# Patient Record
Sex: Female | Born: 1950
Health system: Southern US, Community
[De-identification: ages and names within clinical notes are randomized; demographics above are authoritative.]

## PROBLEM LIST (undated history)

## (undated) DIAGNOSIS — K649 Unspecified hemorrhoids: Secondary | ICD-10-CM

## (undated) DIAGNOSIS — J302 Other seasonal allergic rhinitis: Secondary | ICD-10-CM

## (undated) DIAGNOSIS — K219 Gastro-esophageal reflux disease without esophagitis: Secondary | ICD-10-CM

## (undated) DIAGNOSIS — T4145XA Adverse effect of unspecified anesthetic, initial encounter: Secondary | ICD-10-CM

## (undated) DIAGNOSIS — K5909 Other constipation: Secondary | ICD-10-CM

## (undated) DIAGNOSIS — M549 Dorsalgia, unspecified: Secondary | ICD-10-CM

## (undated) DIAGNOSIS — E7800 Pure hypercholesterolemia, unspecified: Secondary | ICD-10-CM

## (undated) DIAGNOSIS — I10 Essential (primary) hypertension: Secondary | ICD-10-CM

## (undated) DIAGNOSIS — M1990 Unspecified osteoarthritis, unspecified site: Secondary | ICD-10-CM

## (undated) DIAGNOSIS — T8859XA Other complications of anesthesia, initial encounter: Secondary | ICD-10-CM

## (undated) HISTORY — PX: OTHER SURGICAL HISTORY: SHX169

## (undated) HISTORY — DX: Gastro-esophageal reflux disease without esophagitis: K21.9

## (undated) HISTORY — PX: ROTATOR CUFF REPAIR: SHX139

## (undated) HISTORY — DX: Dorsalgia, unspecified: M54.9

## (undated) HISTORY — PX: HAND SURGERY: SHX662

## (undated) HISTORY — PX: DILATION AND CURETTAGE OF UTERUS: SHX78

## (undated) HISTORY — DX: Unspecified hemorrhoids: K64.9

## (undated) HISTORY — PX: CHOLECYSTECTOMY: SHX55

## (undated) HISTORY — PX: BACK SURGERY: SHX140

## (undated) HISTORY — PX: ABDOMINAL HYSTERECTOMY: SHX81

## (undated) HISTORY — DX: Essential (primary) hypertension: I10

## (undated) HISTORY — DX: Pure hypercholesterolemia, unspecified: E78.00

## (undated) HISTORY — DX: Other constipation: K59.09

---

## 1998-06-03 ENCOUNTER — Ambulatory Visit (HOSPITAL_COMMUNITY): Admission: RE | Admit: 1998-06-03 | Discharge: 1998-06-03 | Payer: Self-pay | Admitting: Neurosurgery

## 1998-06-03 ENCOUNTER — Encounter: Payer: Self-pay | Admitting: Neurosurgery

## 1998-06-24 ENCOUNTER — Encounter: Payer: Self-pay | Admitting: Neurosurgery

## 1998-06-24 ENCOUNTER — Ambulatory Visit (HOSPITAL_COMMUNITY): Admission: RE | Admit: 1998-06-24 | Discharge: 1998-06-24 | Payer: Self-pay | Admitting: Neurosurgery

## 1999-12-17 ENCOUNTER — Encounter (INDEPENDENT_AMBULATORY_CARE_PROVIDER_SITE_OTHER): Payer: Self-pay | Admitting: *Deleted

## 1999-12-17 ENCOUNTER — Encounter: Payer: Self-pay | Admitting: Otolaryngology

## 1999-12-17 ENCOUNTER — Ambulatory Visit (HOSPITAL_COMMUNITY): Admission: RE | Admit: 1999-12-17 | Discharge: 1999-12-17 | Payer: Self-pay | Admitting: Otolaryngology

## 2000-07-01 ENCOUNTER — Encounter: Payer: Self-pay | Admitting: Neurosurgery

## 2000-07-01 ENCOUNTER — Ambulatory Visit (HOSPITAL_COMMUNITY): Admission: RE | Admit: 2000-07-01 | Discharge: 2000-07-01 | Payer: Self-pay | Admitting: Neurosurgery

## 2000-10-16 ENCOUNTER — Inpatient Hospital Stay (HOSPITAL_COMMUNITY): Admission: AD | Admit: 2000-10-16 | Discharge: 2000-10-18 | Payer: Self-pay | Admitting: Internal Medicine

## 2000-10-16 ENCOUNTER — Encounter: Payer: Self-pay | Admitting: Internal Medicine

## 2000-10-17 ENCOUNTER — Encounter: Payer: Self-pay | Admitting: Internal Medicine

## 2000-10-17 ENCOUNTER — Encounter: Payer: Self-pay | Admitting: Cardiology

## 2001-03-12 ENCOUNTER — Encounter: Payer: Self-pay | Admitting: Specialist

## 2001-03-12 ENCOUNTER — Ambulatory Visit (HOSPITAL_COMMUNITY): Admission: RE | Admit: 2001-03-12 | Discharge: 2001-03-12 | Payer: Self-pay | Admitting: Specialist

## 2001-03-16 ENCOUNTER — Encounter: Payer: Self-pay | Admitting: Podiatry

## 2001-03-19 ENCOUNTER — Ambulatory Visit (HOSPITAL_COMMUNITY): Admission: RE | Admit: 2001-03-19 | Discharge: 2001-03-19 | Payer: Self-pay | Admitting: Podiatry

## 2001-03-19 ENCOUNTER — Encounter: Payer: Self-pay | Admitting: Podiatry

## 2001-06-06 ENCOUNTER — Ambulatory Visit (HOSPITAL_COMMUNITY): Admission: RE | Admit: 2001-06-06 | Discharge: 2001-06-06 | Payer: Self-pay | Admitting: Specialist

## 2001-06-06 ENCOUNTER — Encounter: Payer: Self-pay | Admitting: Specialist

## 2001-06-15 ENCOUNTER — Ambulatory Visit (HOSPITAL_COMMUNITY): Admission: RE | Admit: 2001-06-15 | Discharge: 2001-06-15 | Payer: Self-pay | Admitting: Specialist

## 2001-06-15 ENCOUNTER — Encounter: Payer: Self-pay | Admitting: Specialist

## 2001-09-07 ENCOUNTER — Encounter: Payer: Self-pay | Admitting: Emergency Medicine

## 2001-09-07 ENCOUNTER — Inpatient Hospital Stay (HOSPITAL_COMMUNITY): Admission: EM | Admit: 2001-09-07 | Discharge: 2001-09-09 | Payer: Self-pay | Admitting: Emergency Medicine

## 2001-09-09 ENCOUNTER — Encounter: Payer: Self-pay | Admitting: Internal Medicine

## 2001-09-21 ENCOUNTER — Ambulatory Visit (HOSPITAL_COMMUNITY): Admission: RE | Admit: 2001-09-21 | Discharge: 2001-09-21 | Payer: Self-pay | Admitting: Internal Medicine

## 2001-09-24 ENCOUNTER — Ambulatory Visit (HOSPITAL_COMMUNITY): Admission: RE | Admit: 2001-09-24 | Discharge: 2001-09-24 | Payer: Self-pay | Admitting: Internal Medicine

## 2001-09-24 ENCOUNTER — Encounter: Payer: Self-pay | Admitting: Internal Medicine

## 2002-03-18 ENCOUNTER — Ambulatory Visit (HOSPITAL_COMMUNITY): Admission: RE | Admit: 2002-03-18 | Discharge: 2002-03-18 | Payer: Self-pay | Admitting: Podiatry

## 2002-06-17 ENCOUNTER — Encounter: Payer: Self-pay | Admitting: Specialist

## 2002-06-17 ENCOUNTER — Ambulatory Visit (HOSPITAL_COMMUNITY): Admission: RE | Admit: 2002-06-17 | Discharge: 2002-06-17 | Payer: Self-pay | Admitting: Specialist

## 2002-09-28 ENCOUNTER — Encounter: Payer: Self-pay | Admitting: Neurosurgery

## 2002-09-28 ENCOUNTER — Ambulatory Visit (HOSPITAL_COMMUNITY): Admission: RE | Admit: 2002-09-28 | Discharge: 2002-09-28 | Payer: Self-pay | Admitting: Neurosurgery

## 2003-06-18 ENCOUNTER — Ambulatory Visit (HOSPITAL_COMMUNITY): Admission: RE | Admit: 2003-06-18 | Discharge: 2003-06-18 | Payer: Self-pay | Admitting: *Deleted

## 2003-10-28 ENCOUNTER — Ambulatory Visit (HOSPITAL_COMMUNITY): Admission: RE | Admit: 2003-10-28 | Discharge: 2003-10-28 | Payer: Self-pay | Admitting: Internal Medicine

## 2003-12-08 ENCOUNTER — Encounter: Payer: Self-pay | Admitting: Orthopedic Surgery

## 2004-01-23 ENCOUNTER — Ambulatory Visit (HOSPITAL_COMMUNITY): Admission: RE | Admit: 2004-01-23 | Discharge: 2004-01-23 | Payer: Self-pay | Admitting: Orthopedic Surgery

## 2004-05-11 ENCOUNTER — Ambulatory Visit (HOSPITAL_COMMUNITY): Admission: RE | Admit: 2004-05-11 | Discharge: 2004-05-11 | Payer: Self-pay | Admitting: Internal Medicine

## 2004-05-27 ENCOUNTER — Ambulatory Visit: Payer: Self-pay | Admitting: Orthopedic Surgery

## 2004-06-01 ENCOUNTER — Encounter (HOSPITAL_COMMUNITY): Admission: RE | Admit: 2004-06-01 | Discharge: 2004-07-01 | Payer: Self-pay | Admitting: Orthopedic Surgery

## 2004-07-01 ENCOUNTER — Ambulatory Visit (HOSPITAL_COMMUNITY): Admission: RE | Admit: 2004-07-01 | Discharge: 2004-07-01 | Payer: Self-pay | Admitting: *Deleted

## 2004-07-05 ENCOUNTER — Encounter (HOSPITAL_COMMUNITY): Admission: RE | Admit: 2004-07-05 | Discharge: 2004-08-04 | Payer: Self-pay | Admitting: Orthopedic Surgery

## 2004-08-11 ENCOUNTER — Ambulatory Visit (HOSPITAL_COMMUNITY): Admission: RE | Admit: 2004-08-11 | Discharge: 2004-08-11 | Payer: Self-pay | Admitting: Internal Medicine

## 2005-07-28 ENCOUNTER — Ambulatory Visit (HOSPITAL_COMMUNITY): Admission: RE | Admit: 2005-07-28 | Discharge: 2005-07-28 | Payer: Self-pay | Admitting: *Deleted

## 2006-05-04 ENCOUNTER — Ambulatory Visit: Payer: Self-pay | Admitting: Internal Medicine

## 2006-05-16 ENCOUNTER — Ambulatory Visit: Payer: Self-pay | Admitting: Orthopedic Surgery

## 2006-05-18 ENCOUNTER — Encounter: Payer: Self-pay | Admitting: Orthopedic Surgery

## 2006-05-18 ENCOUNTER — Ambulatory Visit (HOSPITAL_COMMUNITY): Admission: RE | Admit: 2006-05-18 | Discharge: 2006-05-18 | Payer: Self-pay | Admitting: Orthopedic Surgery

## 2006-05-23 ENCOUNTER — Encounter (HOSPITAL_COMMUNITY): Admission: RE | Admit: 2006-05-23 | Discharge: 2006-06-19 | Payer: Self-pay | Admitting: Orthopedic Surgery

## 2006-05-25 ENCOUNTER — Ambulatory Visit: Payer: Self-pay | Admitting: Orthopedic Surgery

## 2006-06-14 ENCOUNTER — Ambulatory Visit (HOSPITAL_COMMUNITY): Admission: RE | Admit: 2006-06-14 | Discharge: 2006-06-14 | Payer: Self-pay | Admitting: Internal Medicine

## 2006-06-14 ENCOUNTER — Ambulatory Visit: Payer: Self-pay | Admitting: Internal Medicine

## 2006-06-14 HISTORY — PX: ESOPHAGOGASTRODUODENOSCOPY: SHX1529

## 2006-06-27 ENCOUNTER — Ambulatory Visit: Payer: Self-pay | Admitting: Orthopedic Surgery

## 2006-07-20 ENCOUNTER — Ambulatory Visit (HOSPITAL_COMMUNITY): Admission: RE | Admit: 2006-07-20 | Discharge: 2006-07-20 | Payer: Self-pay | Admitting: Internal Medicine

## 2006-08-03 ENCOUNTER — Ambulatory Visit: Payer: Self-pay | Admitting: Internal Medicine

## 2006-10-16 ENCOUNTER — Encounter (INDEPENDENT_AMBULATORY_CARE_PROVIDER_SITE_OTHER): Payer: Self-pay | Admitting: Specialist

## 2006-10-16 ENCOUNTER — Ambulatory Visit (HOSPITAL_COMMUNITY): Admission: RE | Admit: 2006-10-16 | Discharge: 2006-10-16 | Payer: Self-pay | Admitting: Internal Medicine

## 2006-10-16 ENCOUNTER — Ambulatory Visit: Payer: Self-pay | Admitting: Internal Medicine

## 2006-10-16 HISTORY — PX: COLONOSCOPY: SHX5424

## 2007-01-23 ENCOUNTER — Encounter: Payer: Self-pay | Admitting: Orthopedic Surgery

## 2008-04-30 ENCOUNTER — Ambulatory Visit (HOSPITAL_COMMUNITY): Admission: RE | Admit: 2008-04-30 | Discharge: 2008-04-30 | Payer: Self-pay | Admitting: Internal Medicine

## 2008-06-02 ENCOUNTER — Ambulatory Visit: Payer: Self-pay | Admitting: Orthopedic Surgery

## 2008-06-02 DIAGNOSIS — M545 Low back pain: Secondary | ICD-10-CM | POA: Insufficient documentation

## 2008-06-06 ENCOUNTER — Telehealth (INDEPENDENT_AMBULATORY_CARE_PROVIDER_SITE_OTHER): Payer: Self-pay | Admitting: *Deleted

## 2009-05-05 ENCOUNTER — Ambulatory Visit (HOSPITAL_COMMUNITY): Admission: RE | Admit: 2009-05-05 | Discharge: 2009-05-05 | Payer: Self-pay | Admitting: Internal Medicine

## 2009-05-06 ENCOUNTER — Ambulatory Visit (HOSPITAL_COMMUNITY): Admission: RE | Admit: 2009-05-06 | Discharge: 2009-05-06 | Payer: Self-pay | Admitting: Internal Medicine

## 2009-07-21 HISTORY — PX: ESOPHAGOGASTRODUODENOSCOPY: SHX1529

## 2009-07-21 HISTORY — PX: COLONOSCOPY: SHX174

## 2009-07-28 ENCOUNTER — Ambulatory Visit: Payer: Self-pay | Admitting: Internal Medicine

## 2009-07-28 DIAGNOSIS — R198 Other specified symptoms and signs involving the digestive system and abdomen: Secondary | ICD-10-CM

## 2009-07-28 DIAGNOSIS — R634 Abnormal weight loss: Secondary | ICD-10-CM

## 2009-07-28 DIAGNOSIS — K219 Gastro-esophageal reflux disease without esophagitis: Secondary | ICD-10-CM

## 2009-07-28 DIAGNOSIS — K921 Melena: Secondary | ICD-10-CM

## 2009-07-28 DIAGNOSIS — R1013 Epigastric pain: Secondary | ICD-10-CM

## 2009-07-28 DIAGNOSIS — R1310 Dysphagia, unspecified: Secondary | ICD-10-CM | POA: Insufficient documentation

## 2009-07-30 ENCOUNTER — Encounter: Payer: Self-pay | Admitting: Internal Medicine

## 2009-08-03 ENCOUNTER — Ambulatory Visit (HOSPITAL_COMMUNITY): Admission: RE | Admit: 2009-08-03 | Discharge: 2009-08-03 | Payer: Self-pay | Admitting: Internal Medicine

## 2009-08-03 ENCOUNTER — Ambulatory Visit: Payer: Self-pay | Admitting: Internal Medicine

## 2009-08-10 ENCOUNTER — Ambulatory Visit: Payer: Self-pay | Admitting: Orthopedic Surgery

## 2009-08-10 DIAGNOSIS — M25519 Pain in unspecified shoulder: Secondary | ICD-10-CM | POA: Insufficient documentation

## 2009-08-10 DIAGNOSIS — M7580 Other shoulder lesions, unspecified shoulder: Secondary | ICD-10-CM

## 2009-08-10 DIAGNOSIS — M75120 Complete rotator cuff tear or rupture of unspecified shoulder, not specified as traumatic: Secondary | ICD-10-CM

## 2009-08-13 ENCOUNTER — Telehealth: Payer: Self-pay | Admitting: Internal Medicine

## 2009-08-14 ENCOUNTER — Telehealth (INDEPENDENT_AMBULATORY_CARE_PROVIDER_SITE_OTHER): Payer: Self-pay

## 2009-09-22 ENCOUNTER — Ambulatory Visit: Payer: Self-pay | Admitting: Internal Medicine

## 2010-06-17 ENCOUNTER — Ambulatory Visit (HOSPITAL_COMMUNITY)
Admission: RE | Admit: 2010-06-17 | Discharge: 2010-06-17 | Payer: Self-pay | Source: Home / Self Care | Attending: Internal Medicine | Admitting: Internal Medicine

## 2010-07-10 ENCOUNTER — Encounter: Payer: Self-pay | Admitting: *Deleted

## 2010-07-11 ENCOUNTER — Encounter: Payer: Self-pay | Admitting: *Deleted

## 2010-07-11 ENCOUNTER — Encounter: Payer: Self-pay | Admitting: Internal Medicine

## 2010-07-22 NOTE — Letter (Signed)
Summary: TCS/EGD ORDER  TCS/EGD ORDER   Imported By: Ave Filter 07/28/2009 10:24:36  _____________________________________________________________________  External Attachment:    Type:   Image     Comment:   External Document

## 2010-07-22 NOTE — Assessment & Plan Note (Signed)
Summary: PROCEDURE F/U PER RMR/CM   Visit Type:  f/u Primary Care Provider:  [facility]  Chief Complaint:  follow up from procedure and still no appitite.  History of Present Illness: Patient is here for f/u of EGD/TCS/TI. She had erosive esophagitis, distal ring s/p dilation. Mid-esophagus was ring like in appearance, biopsies benign. Random colon bx negative for microscopic colitis. Patient has gained five pounds. She has been on Nexium 40mg  by mouth two times a day for two months. Epigastric pain is better. She has some regurgitation at night especially if she eats late. BM are better. No blood in stool. Swallowing is better s/p dilation. She has some vague lower abdominal discomfort and plans to see her gyn. Denies dysuria.   She was started on Naproxyn two months ago for rotator cuff issues. She previously was on relafen but did not tolerate. She tries to limit use.    Current Medications (verified): 1)  Diovan Hct 160-12.5 Mg Tabs (Valsartan-Hydrochlorothiazide) .... Once Daily 2)  Ibuprofen 200 Mg Tabs (Ibuprofen) .... Two Times A Day 3)  Naprosyn 500 Mg Tabs (Naproxen) .Marland Kitchen.. 1 By Mouth Two Times A Day 4)  Zocor 40 Mg Tabs (Simvastatin) .... Once Daily 5)  Reglan 10 Mg Tabs (Metoclopramide Hcl) .... Once Daily As Needed 6)  Elavil .... As Needed 7)  Tylenol 325 Mg Tabs (Acetaminophen) .... As Needed 8)  Nexium 40 Mg Cpdr (Esomeprazole Magnesium) .... One By Mouth Two Times A Day (30 Mins Before Meal)  Allergies (verified): 1)  ! Demerol 2)  ! Codeine  Review of Systems      See HPI  Vital Signs:  Patient profile:   60 year old female Height:      64.5 inches Weight:      157 pounds BMI:     26.63 Temp:     97.8 degrees F oral Pulse rate:   88 / minute BP sitting:   132 / 88  (left arm) Cuff size:   regular  Vitals Entered By: Hendricks Limes LPN (September 22, 1608 9:16 AM)  Physical Exam  General:  Well developed, well nourished, no acute distress. Head:  Normocephalic and  atraumatic. Eyes:  sclera nonicteric Mouth:  op moist Abdomen:  Bowel sounds normal.  Abdomen is soft, nontender, nondistended.  No rebound or guarding.  No hepatosplenomegaly, masses or hernias.  No abdominal bruits.  Extremities:  No clubbing, cyanosis, edema or deformities noted. Neurologic:  Alert and  oriented x4;  grossly normal neurologically. Skin:  Intact without significant lesions or rashes. Psych:  Alert and cooperative. Normal mood and affect.  Impression & Recommendations:  Problem # 1:  GERD (ICD-530.81)  Doing better on Nexium two times a day. Will continue at least six more weeks and then she can try once per day. Patient assistance forms to be completed by patient. RX for Nexium 40mg  by mouth two times a day #180, 3 refills provided. Samples of Nexium #40 given as well. Antireflux measures stressed especially related to night time symptoms.  OV in four months with Dr. Jena Gauss.  Orders: Est. Patient Level II (96045)  Problem # 2:  EPIGASTRIC PAIN (ICD-789.06)  Gastritis on biopsy. Improved symptoms. She will need to monitor symptoms closely while on naproxyn. Ideally she should avoid NSAIDS but she doesn't think she can due to rotator cuff injury. She will limit use of naproxyn as much as possible, but if epigastric pain worsens she should stop all together.  Orders: Est. Patient Level  II [phone])  Problem # 3:  NEOPLASM, MALIGNANT, COLON, FAMILY HX, SIBLING (ICD-V16.0)  Next TCS 07/2014  Orders: Est. Patient Level II (29562)

## 2010-07-22 NOTE — Letter (Signed)
Summary: Historic Patient File  Historic Patient File   Imported By: Elvera Maria 08/12/2009 13:51:18  _____________________________________________________________________  External Attachment:    Type:   Image     Comment:   history form

## 2010-07-22 NOTE — Assessment & Plan Note (Signed)
Summary: RT ARM PAIN/NEEDS XRAY/MEDICARE/CAF   Vital Signs:  Patient profile:   60 year old female Weight:      152 pounds Pulse rate:   64 / minute Resp:     16 per minute  Vitals Entered By: Fuller Canada MD (August 10, 2009 10:16 AM)  Visit Type:  new problem Referring Provider:  self Primary Provider:  Felecia Shelling  CC:  right arm pain.  History of Present Illness: I saw Tammy Kramer in the office today for a followup visit.  She is a 60 years old woman with the complaint of:  right shoulder pain.  Has had surgery on this shoulder before.  Xrays in our office today.  Meds: Diovan, Prevacid and Advil.    Allergies: 1)  ! Demerol 2)  ! Codeine  Past History:  Past Medical History: DJD Colonoscopy April 2008 by Dr. Karilyn Cota, 3 mm hyperplastic polyp removed from the splenic flexure. EGD with bravo device for pH study placement, December 2007. EGD was normal. PH study was normal as well on double dose PPI EGD, April 2003, from a small sliding hiatal hernia, esophageal dilation performed, nonerosive gastritis of the stomach. H. Pylori +, s/p treatment Colonoscopy, April 2003, normal except for small hemorrhoids. GERD Hypertension Hyperlipidemia DDD  Family History: Both parents are deceased. Mother died age 61 and father at 61. 2 brothers are deceased, half brother died of prostate cancer a 72. Another brother died of colon cancer age 61. Family History of Diabetes Family History Coronary Heart Disease female < 36 Family History of Arthritis  Social History: Divorced. 3 children. She worked at [employer] for 15 years, now disabled. She watches her 82 years old twin grandchildren daily. She never smoked cigarettes. No alcohol use. 2 cups of coffee per day  Review of Systems General:  Complains of weight loss and chills; denies weight gain, fever, and fatigue. Cardiac :  Complains of chest pain; denies angina, heart attack, heart failure, poor circulation,  blood clots, and phlebitis. Resp:  Denies short of breath, difficulty breathing, COPD, cough, and pneumonia. GI:  Complains of reflux; denies nausea, vomiting, diarrhea, constipation, difficulty swallowing, ulcers, and GERD. GU:  Denies kidney failure, kidney transplant, kidney stones, burning, poor stream, testicular cancer, blood in urine, and . Neuro:  Denies headache, dizziness, migraines, numbness, weakness, tremor, and unsteady walking. MS:  Complains of joint pain and joint swelling; denies rheumatoid arthritis, gout, bone cancer, osteoporosis, and ; stiffness and muscle pain. Endo:  Denies thyroid disease, goiter, and diabetes. Psych:  Denies depression, mood swings, anxiety, panic attack, bipolar, and schizophrenia. Derm:  Denies eczema, cancer, and itching. EENT:  Denies poor vision, cataracts, glaucoma, poor hearing, vertigo, ears ringing, sinusitis, hoarseness, toothaches, and bleeding gums. Immunology:  Denies seasonal allergies, sinus problems, and allergic to bee stings. Lymphatic:  Denies lymph node cancer and lymph edema.  Physical Exam  Additional Exam:  GENERAL: Appearance is normal   CDV: normal pulse and temperature   Skin: was normal   Neuro: sensation was normal   MSK  RIGHT shoulder inspection reveals anterolateral tenderness in posterior subacromial tenderness.  Range motion remains full.  Motor exam remains normal.  No joint laxity  Positive impingement sign at 150 of flexion.  LEFT shoulder examined for comparison no tenderness full range of motion normal strength no joint laxity     Impression & Recommendations:  Problem # 1:  IMPINGEMENT SYNDROME (ICD-726.2) Assessment New  x-rays ordered here 2 views RIGHT shoulder  Shenton's  line is broken, mild proximal migration of the proximal humerus with ptosis of the tuberosity  Impression chronic rotator cuff disease with possible tear   inject RIGHT shoulder Verbal consent obtained/The  shoulder was injected with depomedrol 40mg /cc and sensorcaine .25% . There were no complications  Orders: Est. Patient Level IV (19147)  Problem # 2:  SHOULDER PAIN (ICD-719.41) Assessment: New  Her updated medication list for this problem includes:    Ibuprofen 200 Mg Tabs (Ibuprofen) .Marland Kitchen..Marland Kitchen Two times a day    Naprosyn 500 Mg Tabs (Naproxen) .Marland Kitchen... 1 by mouth two times a day  Orders: Est. Patient Level IV (82956) Joint Aspirate / Injection, Large (20610) Depo- Medrol 40mg  (J1030) Shoulder x-ray,  minimum 2 views (21308)  Problem # 3:  RUPTURE ROTATOR CUFF (ICD-727.61) Assessment: New  ? cuff tear  Orders: Est. Patient Level IV (65784)  Medications Added to Medication List This Visit: 1)  Naprosyn 500 Mg Tabs (Naproxen) .Marland Kitchen.. 1 by mouth two times a day  Patient Instructions: 1)  You have received an injection of cortisone today. You may experience increased pain at the injection site. Apply ice pack to the area for 20 minutes every 2 hours and take 2 xtra strength tylenol every 8 hours. This increased pain will usually resolve in 24 hours. The injection will take effect in 3-10 days.  2)  Limit activity to comfort and avoid activities that increase discomfort.  Apply moist heat and/or ice to shoulder and take medication as instructed for pain relief. Please read the Shoulder Pain Handout and start Physical Therapy as directed. 3)  take medication as prescribed  Prescriptions: NAPROSYN 500 MG TABS (NAPROXEN) 1 by mouth two times a day  #60 x 1   Entered and Authorized by:   Fuller Canada MD   Signed by:   Fuller Canada MD on 08/10/2009   Method used:   Print then Give to Patient   RxID:   [id number]

## 2010-07-22 NOTE — Letter (Signed)
Summary: LABS/DR [facility]ANTA  LABS/DR [facility]   Imported By: Diana Eves 07/30/2009 10:39:13  _____________________________________________________________________  External Attachment:    Type:   Image     Comment:   External Document

## 2010-07-22 NOTE — Progress Notes (Signed)
Summary: Pt wants results of path from TCS.  Phone Note Call from Patient   Caller: Patient Summary of Call: Pt would like results of her path from TCS done on 08/03/2009. She is aware it will be Monday before she hears back. Initial call taken by: Cloria Spring LPN,  August 14, 2009 4:25 PM     Appended Document: Pt wants results of path from TCS. Called pt - got generic message; I left a generic message indicating no significant abnormality; pleae let her know  - no colitis; minimal inflammation in stomach' no infection of cancer ; Recommend cont nexium 40 two times a day and f/u w [place] 6 weeks(cc pcp w path)  Appended Document: Pt wants results of path from TCS. tried to call pt- LMOM  Appended Document: Pt wants results of path from TCS. pt aware

## 2010-07-22 NOTE — Assessment & Plan Note (Signed)
Summary: NPP/ABD PAIN,WT LOSS.GU   Visit Type:  Consult Primary Care Provider:  [facility]  Chief Complaint:  abd pain and wt loss.  History of Present Illness: Ms. Tammy Kramer is a pleasant 60 year old lady, patient Dr. Avon Gully, who presents for further evaluation of abdominal pain and weight loss. She complains of 3-4 month history of abdominal pain. The pain has been worse in the last 2 weeks. Two weeks ago she woke up with severe abdominal pain which lasted for 3 days and associated with vomiting and no bowel movement.  When she did have a BM she passed bloody "jelly-like" substance.  She notes a significant change in bowel movements. She has been very regular for years. Over the last several weeks she is having great difficulty having a bowel movement. She's only had a couple of small stools since last Thursday. She complains of postprandial epigastric as well as throat pain. She states it feels like food is sticking and she tries to wash it down. Denies any vomiting. She feels like her stomach is hollow and warm. She c/o achy abdominal pain. She's had a lot of gas and belching. Complains of right arm and right leg pain as well as back pain. Typically worse when she lays down. She tried Kapidex for her reflux and stopped it after thinking it caused her leg pain. She has been on Prevacid 24hr for about one year. Previously had been on Prevacid 30 mg twice daily. Now she's taking Prevacid 24-hour 15 mg twice a day and having breakthrough symptoms. She notes that her weight has dropped 20 pounds since November 2010.   Labs from May 06, 2009. CBC normal. Glucose 102. Creatinine 0.90. LFTs normal. TSH 1.280. Free T4 1.44.          Current Medications (verified): 1)  Diovan Hct 160-12.5 Mg Tabs (Valsartan-Hydrochlorothiazide) .... Once Daily 2)  Ibuprofen 200 Mg Tabs (Ibuprofen) .... Two Times A Day 3)  Prevacid 24hr 15 Mg Cpdr (Lansoprazole) .... One By Mouth Bid  Allergies  (verified): 1)  ! Demerol 2)  ! Codeine  Past History:  Past Medical History: DJD Colonoscopy April 2008 by Dr. Karilyn Cota, 3 mm hyperplastic polyp removed from the splenic flexure. EGD with bravo device for pH study placement, December 2007. EGD was normal. PH study was normal as well on double dose PPI EGD, April 2003, from a small sliding hiatal hernia, esophageal dilation performed, nonerosive gastritis of the stomach. H. Pylori +, s/p treatment Colonoscopy, April 2003, normal except for small hemorrhoids. GERD Hypertension Hyperlipidemia  Past Surgical History: rt foot spur, 2 surgeries lt foot, morton's neuroma, two small tumors, bunion, 3 total surgeries c spine fusion, 1997 hysterectomy gallbladder rt shoulder,rotator cuff rt carpal tunnel Nerve reconstruction for removal of ganglion cyst throat, benign lesion removed from her vocal cord in 2001 lower back surgery x2 nasal surgery  Family History: Both parents are deceased. Mother died age 47 and father at 76. 2 brothers are deceased, half brother died of prostate cancer a 78. Another brother died of colon cancer age 47.  Social History: Divorced. 3 children. She worked at [employer] for 15 years, now disabled. She watches her 14 years old twin grandchildren daily. She never smoked cigarettes. No alcohol use.  Review of Systems General:  Complains of weight loss; denies fever, chills, sweats, anorexia, fatigue, and weakness. Eyes:  Denies vision loss. ENT:  Complains of difficulty swallowing; denies nasal congestion, sore throat, and hoarseness. CV:  Denies chest pains, angina,  palpitations, dyspnea on exertion, and peripheral edema. Resp:  Denies dyspnea at rest, dyspnea with exercise, and cough. GI:  See HPI. GU:  Denies urinary burning and blood in urine. MS:  Complains of joint pain / LOM and low back pain. Derm:  Denies rash and itching. Neuro:  Denies weakness, paralysis, frequent headaches, memory  loss, and confusion. Psych:  Denies depression and anxiety. Endo:  Complains of unusual weight change. Heme:  Denies bruising and bleeding. Allergy:  Denies hives and rash.  Vital Signs:  Patient profile:   60 year old female Height:      64.5 inches Weight:      154 pounds BMI:     26.12 Temp:     98.3 degrees F oral Pulse rate:   72 / minute BP sitting:   138 / 88  (left arm) Cuff size:   regular  Vitals Entered By: Hendricks Limes LPN (July 28, 2009 9:12 AM)  Physical Exam  General:  Well developed, well nourished, no acute distress. Head:  Normocephalic and atraumatic. Eyes:  Conjunctivae pink, no scleral icterus.  Mouth:  Oropharyngeal mucosa moist, pink.  No lesions, erythema or exudate.    Neck:  Supple; no masses or thyromegaly. Lungs:  Clear throughout to auscultation. Heart:  Regular rate and rhythm; no murmurs, rubs,  or bruits. Abdomen:  Bowel sounds normal.  Abdomen is soft, nondistended. Epigastric and lower abdominal tenderness.  No rebound or guarding.  No hepatosplenomegaly, masses or hernias.  No abdominal bruits.  Rectal:  deferred until time of colonoscopy.   Extremities:  No clubbing, cyanosis, edema or deformities noted. Neurologic:  Alert and  oriented x4;  grossly normal neurologically. Skin:  Intact without significant lesions or rashes. Cervical Nodes:  No significant cervical adenopathy. Psych:  Alert and cooperative. Normal mood and affect.  Impression & Recommendations:  Problem # 1:  WEIGHT LOSS, ABNORMAL (ICD-783.21)  20 pound weight loss associated with change in bowels, blood in the stool, epigastric pain, dysphagia, refractory gerd. Suspect some of her refractory gerd secondary to inadequate PPI dosing. Previously on Prevacid 30mg  two times a day and then switched to OTC at 15mg  two times a day. Due to alarm symptoms she will need EGD. She also needs repeat TCS for change in bowels and blood in stool, wt loss. FH significant for CRC in  sibling less than age 37. EGD/Colonoscopy to be performed in near future.  Risks, alternatives, and benefits including but not limited to the risk of reaction to medication, bleeding, infection, and perforation were addressed.  Patient voiced understanding and provided verbal consent. If nothing found on EGD/TCS to explain weight loss, she may need CXR and CT A/P as next step.  Please note, patient is allergic to DEMEROL.  Problem # 2:  GERD (ICD-530.81)  Stop Prevacid 24hr. Nexium 40mg  by mouth once to twice daily. #30 samples provided. EGD as planned.  Other Orders: Consultation Level IV (16109) I would like to thank Dr. Felecia Shelling for allowing [place] to take part in the care of this nice patient.

## 2010-07-22 NOTE — Progress Notes (Signed)
----   Converted from flag ---- ---- 08/12/2009 3:49 PM, Cloria Spring LPN wrote: Per Trula Ore @ [facility], could not find an order, never done.  ---- 08/05/2009 9:03 AM, Jonathon Bellows MD wrote: need koh report in to emr ------------------------------  noted

## 2010-09-08 LAB — CLOSTRIDIUM DIFFICILE EIA: C difficile Toxins A+B, EIA: NEGATIVE

## 2010-09-08 LAB — STOOL CULTURE

## 2010-09-08 LAB — FECAL LACTOFERRIN, QUANT: Fecal Lactoferrin: NEGATIVE

## 2010-09-08 LAB — OVA AND PARASITE EXAMINATION: Ova and parasites: NONE SEEN

## 2010-11-05 NOTE — Op Note (Signed)
[name], BATRA               ACCOUNT NO.:  [id number]   MEDICAL RECORD NO.:  [id number]          PATIENT TYPE:  AMB   LOCATION:  DAY                           FACILITY:  [facility]   PHYSICIAN:  Lionel December, M.D.    DATE OF BIRTH:  [birth year]   DATE OF PROCEDURE:  06/14/2006  DATE OF DISCHARGE:                               OPERATIVE REPORT   STUDY:  Esophageal pH monitoring with Bravo device.   INDICATIONS:  Ms. Tammy Kramer is 60 year old African-American female with  persistent symptoms of GERD and regurgitation who has been on double  dose PPI and she also had a short trial with Reglan but without  symptomatic improvement.  She had an EGD which was normal.  She had a  Bravo device placed at that time.   FINDINGS:  Day one analysis:  1. Duration of study is 20 hours and 26 minutes.  2. Number of reflux episodes in study:  11, 10 of which occurred in      upright and one in supine.  3. Number of reflux episodes greater than 5 minutes is one, occurring      in upright position.  4. Duration of longest reflux episode was 10 minutes.  5. Time pH below 4 was 17 minutes.  6. Fraction time pH below 4 is 1.4%.   Day two analysis:  1. Study duration is 18 hours and 42 minutes.  2. Number of reflux episodes in study 6.  3. Number of reflux episodes greater than 5 minutes is zero.  4. Duration of longest reflux episode 3 minutes.  5. Time pH below 4 is 5 minutes.  6. Fraction time pH below four is 0.4%.   Two-day analysis:  1. Study duration is 39 hours and 8 minutes.  2. Number of reflux episodes 17.  3. Number of reflux episodes greater than 5 was one.  4. Duration of longest reflux episode is 10 minutes.  5. Time pH below 4 is 22 minutes.  6. Fraction time pH below 4 is 0.9%.   SYMPTOM DIARY:  The patient recorded two episodes of regurgitation and  acid was documented during one of these episodes.   The patient recorded six episodes of heartburn but acid was not  documented in  her esophagus with any of these episodes.   FINAL DIAGNOSIS:  This is a normal study, implying that therapy is  working.   She had very few episodes of heartburn reported but there was no  correlation with reflux episodes.   RECOMMENDATIONS:  Will try out on Reglan at full-dose, i.e. 10 mg a.c.  and every bedtime in addition to her PPI.  We will call in a  prescription for one month and arrange for office visit at that time.      Lionel December, M.D.  Electronically Signed     NR/MEDQ  D:  06/19/2006  T:  06/19/2006  Job:  [id number]   cc:   Tesfaye D. Felecia Shelling, MD  Fax: [phone]

## 2010-11-05 NOTE — Consult Note (Signed)
Tammy Kramer, Tammy Kramer               ACCOUNT NO.:  [id number]   MEDICAL RECORD NO.:  [id number]           PATIENT TYPE:  AMB   LOCATION:                                FACILITY:  [facility]   PHYSICIAN:  Lionel December, M.D.    DATE OF BIRTH:  [birth year]   DATE OF CONSULTATION:  DATE OF DISCHARGE:                                   CONSULTATION   PRESENTING COMPLAINT:  Constipation, abdominal pain and GERD symptoms not  controlled with therapy.   HISTORY OF PRESENT ILLNESS:  The patient is a 60 year old Afro American  female who is referred through courtesy of Dr. Felecia Shelling for GI evaluation.  She  has several year history of GERD.  She had esophagogastroduodenoscopy back  in 09/2001 by me when she was also complaining of dysphagia.  She had small  sliding hiatal hernia and H pylori gastritis for which she was given  Prevpac.  She has been maintained on Prevacid and has done well until a few  months ago. Now she is having intermittent breakthrough symptoms,  particularly at night and early in the morning when she wakes up with  regurgitation and has to take a third dose of Prevacid.  She does complain  of feeling of choking although this does not occur when she is swallowing  her food.  She has been watching her diet.  She states at times even bland  food will trigger symptoms and other times she could eat pizza and have no  difficulty.  She denies vomiting which she has experienced nausea and  bloating.  Her appetite is normal. However, she has lost 6 pounds this year.  She also complains of constipation.  She was on MiraLax which used to work  but not anymore.  She generally has two bowel movements per week.  At times  she has hard time expelling hard stool and she has excruciating pain at  tail bone.  She denies melena or frank bleeding, although she has had  blood in the tissue which she feels is due to hemorrhoids.  She is using  Correctol no more than once a month.  She has been on high fiber  diet.  She  states she had a recent lab studies by Dr. Felecia Shelling but not sure whether she  had thyroid, calcium  or CBC.  Her last high risk screening colonoscopy was  in 09/2001 and she is scheduled to undergo next exam in 09/2006.   REVIEW OF SYSTEMS:  Positive for intermittent lower abdominal pain.  At  times she feels sore all over her abdomen.  She denies fever, chills or  night sweats.  She has back pain and is using Elavil and/or Ultram on p.r.n.  basis and pain is worse.   She is on Prevacid 30 mg b.i.d. Diovan 160/12.5 q.d., Zocor 40 mg q.d.,  Tylenol, Ultram and Elavil on p.r.n. basis. Doses unknown.   PAST MEDICAL HISTORY:  She has been hypertensive for about 10 years. She has  hyperlipidemia, chronic GERD.  She is status post therapy for H pylori  gastritis in  09/2001.  At that time she also had her esophagus dilated for  dysphagia, although no ring or stricture was noted.  She had neck surgery in  1997 with fusion at C4 and C5. She has had two more surgeries on her lower  back within 18 months of her neck surgery.  Other surgeries include nasal  surgery details of which are not available.  She had surgery in her right  hand for carpal tunnel, nerve reconstruction and for removal of ganglion  cyst.  She has had right shoulder surgery for rotator cuff tear.  She had  benign lesion removed from her vocal cord back in 2001.  She had  hysterectomy 27 years ago.  She had cholecystectomy in 2000 and she also has  had multiple foot surgeries.   ALLERGIES:  Codeine which causes syncope.   FAMILY HISTORY:  Both parents are deceased.  Mother died at age 61 and  father at 53.  She has three sisters and one brother living.  Two brothers  are deceased (half brother died of prostate cancer at age 38).  One brother  died of colon carcinoma age 66.   SOCIAL HISTORY:  She is divorced.  She has three children.  Her son has  heart disease.  She worked at what used to be [employer]  for over  15 years but now she is disabled.  She has never smoked cigarettes and does  not drink alcohol.   PHYSICAL EXAM:  GENERAL:  Pleasant well-developed, well-nourished Afro  American female who is in no acute distress.  VITALS:  She weighs 159-1/2 pounds.  She is 5 feet, 1 inch tall.  Pulse 88  per minute, blood pressure 120/78, temperature is 98.2.  HEENT:  Conjunctivae is pink.  Sclerae is nonicteric.  Oral pharyngeal  mucosa is normal.  NECK:  No neck masses or thyromegaly noted.  CARDIAC EXAM:  With regular rhythm.  Normal S1-S2.  No murmur or gallop  noted.  LUNGS:  Clear to auscultation.  ABDOMEN:  Full, bowel sounds are normal.  Soft abdomen with mild tenderness  which is more generalized but slightly more so at left lower quadrant where  sigmoid colon is palpable.  RECTAL:  Examination reveals no abnormality.  Stool is guaiac negative and  no evidence of impaction.  She does not have peripheral edema or clubbing.   ASSESSMENT:  1. Chronic GERD.  She is now having breakthrough symptoms despite taking      Prevacid 2-3 doses a day.  It may be that she needs to be switched to      and other agents.  She could also have gastric dysmotility.  2. Worsening constipation.  Her last colonoscopy was in 09/2001.  She will      need a colonoscopy within the next 6 months.  I suspect she has colonic      dysmotility. However, if she does not respond to therapy, we may change      our plan and bring her now for a colonoscopy.   RECOMMENDATIONS:  1. Will request lab studies in Dr. Letitia Neri office.  Unless she has had      recent CBC, calcium and TSH, these will be checked.  2. Discontinue Prevacid.  3. Antireflux measures reinforced.  4. __________ 40 mg q. a.m. or at bedtime, samples given.  5. Reglan 10 mg before breakfast and evening meal.  Prescription given for      60.  The patient was informed  of potential side effects and should she      have any problems she will give [place] a  call. 6. High fiber diet with fiber supplement.  She was given samples of      Fibersure.  7. She will go back on MiraLax at 17 grams q.d.  Prescription given for      five refills.   She will call us with a progress report in 2 weeks.  Unless she improves  with these changes, would consider further evaluation to include EGD,  colonoscopy and possibly abdominal CT.   We would like to thank Dr. Felecia Shelling for the opportunity to participate in the  care of this nice lady.      Lionel December, M.D.  Electronically Signed     NR/MEDQ  D:  05/04/2006  T:  05/05/2006  Job:  [id number]   cc:   Tesfaye D. Felecia Shelling, MD  Fax: [phone]

## 2010-11-05 NOTE — Discharge Summary (Signed)
[facility]  Patient:    Tammy Kramer, Tammy Kramer Visit Number: [id number] MRN: [id number]          Service Type: MED Location: 3A A332 01 Attending Physician:  Avon Gully Dictated by:   Renne Musca, M.D. Admit Date:  09/07/2001 Discharge Date: 09/09/2001   CC:         Avon Gully, M.D.   Discharge Summary  DATE OF BIRTH:  [birth year].  DIAGNOSES: 1. Abdominal pain. 2. Severe constipation. 3. Anxiety. 4. Hypertension. 5. History of a C4-5 fusion 6. Status post cholecystectomy. 7. Status post hysterectomy.  DISPOSITION:  Discharged to home.  MEDICATIONS: 1. Uniretic 15 mg daily. 2. Norvasc 5 mg daily. 3. Miralax 17 grams b.i.d. 4. Metamucil as directed.  CONDITION:  Improved.  HOSPITAL COURSE:  The patient is a 60 year old African-American female who presented to the hospital with a 12-hour history of increasing abdominal pain. The patients past medical history is as noted above.  It is significant for chronic constipation.  Initial abdominal x-ray revealed minimal nonobstructive ileus with large amount of stool throughout the colon.  The patient was very anxious and in distress secondary to her abdominal pain.  She was admitted to the hospital for further evaluation.  No stool was noted on rectal examination.  She has a fear of colon cancer secondary to 2 brothers who potentially had colon cancer as their cause of death.  In any event the patient was given miralax which had no significant impact.  She was then given GoLYTELY and a CT scan of the abdomen was performed which was essentially unremarkable although I do not have dictated report, the preliminary normal.  She had no fever.  Abdomen examination revealed her abdomen to be soft, and nontender with active bowel sounds.  She had begun to stool after the GoLYTELY.  She is being discharged to home with the regimen as noted above.  She has a colonoscopy scheduled with Dr.  Karilyn Cota on October 01, 2001.  The findings and upper evaluation were discussed at length with her and her daughter to seemed to have reasonable understanding.  PHYSICAL EXAMINATION:  GENERAL:  REveals a well-developed well-nourished African-American female in no acute distress.  VITAL SIGNS:  Blood pressure 136/71, she is afebrile.  Pulse is 74 and regular.  Respirations are unlabored.  NECK:  Supple. There is a left anterior neck scar which is unchanged.  LUNGS:  Clear to auscultation anteriorly and posteriorly.  HEART:  Regular, no murmurs.  ABDOMEN:  Protuberant, soft, nontender with active bowel sounds.  EXTREMITIES:  No clubbing, cyanosis, or edema.  SKIN:  Reveals no rashes.  The patient has a resolving, what looked like a folliculitis on her buttock.  ASSESSMENT AND PLAN:  As noted above.  FOLLOWUP:  As noted above. Dictated by:   Renne Musca, M.D. Attending Physician:  Avon Gully DD:  09/09/01 TD:  09/10/01 Job: [id number]

## 2010-11-05 NOTE — H&P (Signed)
Tammy Kramer, Tammy Kramer                         ACCOUNT NO.:  [id number]   MEDICAL RECORD NO.:  [id number]                   PATIENT TYPE:  AMB   LOCATION:  DAY                                  FACILITY:  [facility]   PHYSICIAN:  Vickki Hearing, M.D.           DATE OF BIRTH:  02-20-1951   DATE OF ADMISSION:  DATE OF DISCHARGE:                                HISTORY & PHYSICAL   CHIEF COMPLAINT:  Spur, right foot.   HISTORY:  This is a 60 year old female who has a spur on the outside of her  right foot.  An x-ray was taken that showed a bone spur.  An interesting  finding was that the spur was in the heel and the patient was complaining of  her pain in the fifth metatarsal at its base.  The new radiographs with a  marker show that this area is flared and causing pressure for her and will  have to be excised.   A major problem with this bone is it is causing her to have trouble wearing  shoes.   ALLERGIES:  No allergies.   MEDICAL PROBLEMS:  1. Gastroesophageal reflux.  2. Hypertension.  3. History of degenerative joint disease.   PAST SURGICAL HISTORY:  1. Previous surgery on foot noted.  2. Cervical spine at C4-5 with fusion.  3. Hysterectomy.  4. Cholecystectomy.  5. Surgery on her shoulder and hand.  6. Surgery on the throat.   PHARMACY:  [facility].   MEDICATIONS:  1. Norvasc 10 mg.  2. Diovan 160 mg.   FAMILY HISTORY:  Heart disease.   SOCIAL HISTORY:  She is single.  She has a high school diploma.  She has no  social habits.   FAMILY PHYSICIAN:  Dr. Felecia Shelling.   PHARMACY:  VITAL SIGNS:  Weight 163, pulse 80, respiratory rate 20.  GENERAL:  She is well developed, well nourished.  Grooming and hygiene are  normal.  No gross deformities.  CARDIOVASCULAR:  No pulse and perfusion without swelling.  EXTREMITIES:  No varicosities.  LYMPH NODES:  Normal.  MUSCULOSKELETAL:  Showed normal gait and station.  Right foot was tender  over the proximal aspect  of the 5th metatarsal.  Otherwise range of motion,  strength, and stability were normal.  SKIN:  Intact.  Sensation was normal.  NEUROLOGIC:  She was awake, alert, and oriented x3.   LABORATORY DATA:  Radiographs show that the marker is over the fifth  metatarsal and its prominent area is flaring, causing a pressure area.   PLAN:  At the patient's request, due to difficult shoe wear, she is having  shaving of the fifth metatarsal bone and its base.     ___________________________________________  Vickki Hearing, M.D.   SEH/MEDQ  D:  01/22/2004  T:  01/22/2004  Job:  [id number]

## 2010-11-05 NOTE — Op Note (Signed)
[facility]  Patient:    Tammy Kramer, Tammy Kramer                      MRN: [id number] Proc. Date: 12/17/99 Adm. Date:  [id number] Disc. Date: [id number] Attending:  Barbee Cough                           Operative Report  PREOPERATIVE DIAGNOSIS:  Pigmented laryngeal lesion.  POSTOPERATIVE DIAGNOSIS:  Pigmented laryngeal lesion.  OPERATION PERFORMED: 1. Direct laryngoscopy. 2. Microlaryngoscopy with biopsy and CO2 laser excision of pigmented laryngeal    lesion. 3. Esophagoscopy.  SURGEON:  Kinnie Scales. Annalee Genta, M.D.  ANESTHESIA:  General endotracheal.  ESTIMATED BLOOD LOSS:  Minimal.  COMPLICATIONS:  None.  The patient was transferred from the operating room to the recovery room in stable condition.  FINDINGS:  On microlaryngoscopy, a 2 to 3 mm pigmented flat lesion involving the medial aspect of the arytenoid cartilage.  BRIEF HISTORY:  The patient is a 60 year old black female who underwent recent general anesthesia and on examination and intubation was noted to have a mucosal lesion in the region of the left arytenoid cartilage.  She was referred for evaluation and further management.  Flexible laryngoscopy was performed in the office and the patient was found to have a flat mucosal lesion involving the medial process of the left arytenoid cartilage.  She had no other oral or pharyngeal discolorations or pigmented areas and no other abnormal findings were noted on examination.  The patient was concerned about the possibility of a malignancy, specifically a mucosal melanoma.  Given her history and findings, I recommended that we undertake laryngoscopy with excisional biopsy.  The risks, benefits, and possible complications of the surgical procedure were discussed in detail.  The patient understood and concurred with our plan for surgery which was scheduled as above.  DESCRIPTION OF PROCEDURE:  The patient was brought to the operating room  on December 17, 1999 and placed in supine position on the operating table.  General endotracheal anesthesia was established without difficulty.  When the patient had been adequately anesthetized with a Zomed laser endotracheal tube, direct laryngoscopy was performed.  Using a Dedo laryngoscope, the patients oral cavity, oropharynx, superglottis, larynx and hypopharynx were thoroughly examined.  There were no lesions or ulcerations.  The only abnormal finding was a small approximately 2 to 3 mm pigmented lesion overlying the medial aspect of the arytenoid cartilage.  Cervical esophagoscopy was then performed with a 30 cm cervical esophagoscope.  The esophageal introitus and proximal aspects of the esophagus were examined.  The esophagoscope was gently removed and there was no evidence of ulcerations, webs, lesions or pigmented abnormalities.  With a normal esophagoscopy, the laryngoscope was reinserted and suspended using Lewy microsuspension.  The operating microscope was moved into position.  Using a CO2 laser, the mucosa surrounding the pigmented lesionwas lased for hemostasis and control.  Using laryngeal microscissors, submucosal dissection was then carried out.  The mucosal pigmented lesion appeared to be superficial in nature and not involving the deep submucosa or vocal process of the arytenoid cartilage.  The entire pigmented lesion was removed and sent to pathology for gross microscopic evaluation.  Using the CO2 laser, the underlying area was re-treated for hemostasis and possible control of additional disease.  There was no active bleeding and no other abnormality noted.  The laryngoscope was then removed without difficulty.  There was no  bleeding, minimal secretions.  The patient was awakened from her anesthetic, extubated without difficulty and was then transferred from the operating room to the recovery room in stable condition. DD:  12/17/99 TD:  12/18/99 Job:  [id number]

## 2010-11-05 NOTE — Op Note (Signed)
NAMEBETSY, Tammy Kramer               ACCOUNT NO.:  [id number]   MEDICAL RECORD NO.:  [id number]          PATIENT TYPE:  AMB   LOCATION:  DAY                           FACILITY:  [facility]   PHYSICIAN:  Lionel December, M.D.    DATE OF BIRTH:  Apr 12, 1951   DATE OF PROCEDURE:  06/14/2006  DATE OF DISCHARGE:                               OPERATIVE REPORT   PROCEDURE:  Esophagogastroduodenoscopy with placement of Bravo device  for pH study.   INDICATIONS:  Ms. Driskill is 60 year old Afro American female with  chronic GERD, who was been maintained double dose PPI but remains with  regurgitation cough and some heartburn and dysphagia.  She was seen in  the office a few weeks ago and switched to Zegerid but she did not do  any better.  She is also been tried on Reglan along with a PPI.  She is  not having significant improvement.  She is undergoing EGD.  The  esophageal mucosa is normal and no other abnormalities noted.  Bravo  device would be placed for pH study.  Procedure risks were reviewed the  patient, informed consent was obtained.   MEDS FOR CONSCIOUS SEDATION:  Benzocaine spray for pharyngeal topical  anesthesia, fentanyl 50 mcg IV, Versed 8 mg IV.   FINDINGS:  Procedure performed in endoscopy suite.  The patient's vital  signs and O2 sat were monitored during procedure and remained stable.  The patient was placed left lateral recumbent position and Pentax  videoscope was passed via oropharynx without any difficulty into  esophagus.   Esophagus mucosa of the esophagus was normal.  GE junction was at 39 cm  from the incisors and was unremarkable.  Pictures taken for the record.   Stomach.  It had some bile in it.  There was no food debris.  Stomach  distended very well insufflation.  Folds of proximal stomach were  normal.  Examination mucosa at body antrum, pyloric channel as well as  angularis, fundus and cardia was normal.   Duodenum bulbar mucosa was normal.  Scope was passed  second part of the  duodenum where mucosa and folds were normal.  Endoscope was withdrawn.   Since esophageal mucosa was normal, we proceeded to place her Bravo  device.   The Bravo device was already loaded onto delivery catheter.  Was  calibrated.  Was blindly passed via oropharynx into esophagus to 33 cm  from the incisors.  The catheter was connected to suction device for 30  seconds.  Plunger was pushed to secure the device to esophageal mucosa  and was turned clockwise.  The delivery catheter was removed.  Endoscope  was passed again and device was well connected to esophageal mucosa.  Pictures taken for record and endoscope was withdrawn.  The patient  tolerated the procedure well.   FINAL DIAGNOSIS:  Normal esophagogastroduodenoscopy.   Bravo device placed at distal esophagus for pH monitoring for 48 hours.   RECOMMENDATIONS:  The patient will continue her Prevacid 30 mg b.i.d.  and keep symptom diary as reviewed.  She will return to this facility in  2 days.      Lionel December, M.D.  Electronically Signed     NR/MEDQ  D:  06/14/2006  T:  06/14/2006  Job:  [id number]   cc:   Tesfaye D. Felecia Shelling, MD  Fax: [phone]

## 2010-11-05 NOTE — Op Note (Signed)
   Tammy Kramer, Tammy Kramer                         ACCOUNT NO.:  [id number]   MEDICAL RECORD NO.:  [id number]                  PATIENT TYPE:  INP   LOCATION:  5524                                 FACILITY:  [facility]   PHYSICIAN:  Oley Balm. Pricilla Holm, D.P.M.             DATE OF BIRTH:  03/29/1930   DATE OF PROCEDURE:  03/18/2002  DATE OF DISCHARGE:                                 OPERATIVE REPORT   SURGEON:  Oley Balm. Pricilla Holm, D.P.M.   ANESTHESIA:  Local.   PREOPERATIVE DIAGNOSIS:  Symptomatic pins x2, left foot.   POSTOPERATIVE DIAGNOSIS:  Symptomatic pins x2, left foot.   PROCEDURE:  Removal of pins x2, left foot.   INDICATIONS FOR PROCEDURE:  Symptomatic internal fixation device.   PROCEDURE:  The patient was brought into the operating room, placed on the  operating table in a supine position.  The patient's lower left foot and leg  was then prepped and draped in the usual aseptic manner.  Then, with an  ankle tourniquet placed well padded externally elevated to 250 mmHg after  exsanguination, left foot.  The final surgical procedure was then performed  under local anesthesia.   EXCISION OF PINS X2, LEFT FOOT:  Attention was directed to the dorsal aspect  of the left foot at the level of the first metatarsal where a linear  incision was made.  The incision was lying deep via sharp and blunt  dissection making sure to identify all vital structures.  The capsular  incision made.  The pins were isolated and identified, removed from the  wound.  The wound was irrigated with large amounts of sterile saline.  Capsule, subcutaneous tissue, __________ suture of 4-0 Dexon.  The skin was  approximated using horizontal mattress suture of 4-0 Prolene.   All surgical sites were infiltrated with approximately 1.8 cc of  dexamethasone phosphate and bandage consisting of Betadine-soaked Adaptic.  Sterile 4x4s and sterile Kling were then applied and the patient tolerated  the procedure well and left  the operating room in apparent good condition.  Vital signs were stable in the recovery room.                                               Oley Balm Pricilla Holm, D.P.M.    DBT/MEDQ  D:  03/18/2002  T:  03/18/2002  Job:  [id number]

## 2010-11-05 NOTE — H&P (Signed)
[facility]  Patient:    Tammy Kramer, Tammy Kramer Visit Number: [id number] MRN: [id number]          Service Type: MED Location: 3A A332 01 Attending Physician:  Herbert Seta Dictated by:   Renne Musca, M.D. Admit Date:  09/07/2001                           History and Physical  DATE OF BIRTH:  03/06/1951  CHIEF COMPLAINT:  The patient is a 60 year old African-American female, with a past medical history remarkable for hypertension, history of abdominal surgeries including cholecystectomy and hysterectomy, remote, who was in her usual state of health until approximately two to three days ago when she was seen for "viral syndrome," consisting of some upper abdominal discomfort and "scratchy throat" and phlegm.  HISTORY OF PRESENT ILLNESS:  She was given Allegra and Prevacid.  Last p.m. she developed cramping abdominal pain in the upper epigastric area which radiated to the umbilicus and persisted.  She described it as sharp, constant, exacerbated by p.o. intake.  She felt as though she had to have a bowel movement and administered an enema with some stool noted, but the pain did not resolve.  She finally presented to the emergency room today with persistent abdominal pain.  Evaluation in the emergency room revealed a WBC of 12.9 with 78% segs, no bands; hemoglobin 15; hematocrit 43; platelets 408,000.  BUN 8, creatinine 0.9.  LFTs normal.  Amylase 61.  An abdominal film reveals copious stool throughout the colon with some dilated large bowel loops but no air fluid level of significant noted.  Physical examination in the emergency room was pertinent for abdominal tenderness but no evidence of acute abdomen.  The patient is being admitted for the question of diverticulitis versus obstipation.  MEDICATIONS:  1. Uniretic 15/12.5 mg q.d.  2. Norvasc 5 mg q.d.  3. Elavil 10 mg q.h.s. p.r.n.  4. Prevacid 30 mg q.d., which she recently  started.  ALLERGIES: CODEINE.  SOCIAL HISTORY:  The patient is independent.  She has three children who are alive and well.  She is divorced.  No tobacco or alcohol abuse.  FAMILY HISTORY:  Pertinent for brother who died recently with question of colon cancer, and a brother with pancreatic cancer.  Family medical history is also remarkable for diabetes, hypertension in multiple siblings and parents, and one sister.  REVIEW OF SYSTEMS:  The patient is chronically constipated.  She takes occasional psyllium.  She stools once or twice weekly, sometimes as little as once every two weeks.  No GU complaints.  PAST MEDICAL HISTORY:  1. Also remarkable for C4-5 fusion.  2. She has had surgery on her shoulders and hands because of neuropathy,     which ultimately was diagnosed as the C4-5 lesion.  PHYSICAL EXAMINATION:  GENERAL:  Well-developed, well-nourished African-American female in no distress.  VITAL SIGNS:  Blood pressure 140/69, pulse 76 and regular, respirations unlabored.  NEUROLOGIC:  The patient is alert and appropriate.  No focal neurologic deficits noted.  NECK:  Supple.  There is a scar at the left anterior neck.  No adenopathy, JVD, thyromegaly, or bruits noted.  LUNGS:  Clear to auscultation anteriorly and posteriorly.  HEART:  Regular rate and rhythm with no murmurs, rubs, or gallops.  ABDOMEN:  Protuberant, soft, obese.  Bowel sounds are present, though sparse. She has mild diffuse tenderness.  No rebound or guarding noted.  EXTREMITIES:  Without clubbing, cyanosis, or edema.  IMPRESSION: Abdominal pain, possible diverticulitis.  Suspect the patients chronic constipation is playing a role.  PLAN:  1. We will begin MiraLax b.i.d. to see if we can stimulate some bowel     movement without too much cramping.  2. Pain medications have been ordered.  3. Will give some fluids and replete her potassium, which is 3.6, and may be     a result of the recent  diuretic addition to her regimen.  4. The plan was discussed with the patient and her daughter, who have     reasonable understanding.  5. If the patient is going to need a colonoscopy depending on other findings     during the hospitalization, will defer that to the primary attending. Dictated by:   Renne Musca, M.D. Attending Physician:  Herbert Seta DD:  09/07/01 TD:  09/09/01 Job: [id number]

## 2010-11-05 NOTE — Op Note (Signed)
Tammy Kramer, Tammy Kramer               ACCOUNT NO.:  [id number]   MEDICAL RECORD NO.:  [id number]          PATIENT TYPE:  AMB   LOCATION:  DAY                           FACILITY:  [facility]   PHYSICIAN:  Lionel December, M.D.    DATE OF BIRTH:  1951-02-18   DATE OF PROCEDURE:  10/16/2006  DATE OF DISCHARGE:                               OPERATIVE REPORT   PROCEDURE:  Colonoscopy.   INDICATIONS:  Ms. Schlottman is a 60 year old African American female who is  undergoing high-risk screening colonoscopy.  Her brother died of  metastatic colon carcinoma within two weeks of diagnosis at age 29.  Her  last exam was five years ago and was normal.  Procedure risks were  reviewed with the patient and informed consent was obtained.   MEDICATIONS FOR CONSCIOUS SEDATION:  Demerol 50 mg IV and Versed 6 mg IV  in divided dose.   FINDINGS:  Procedure performed in endoscopy suite.  The patient's vital  signs and O2 sat were monitored during procedure and remained stable.  The patient was placed in the left lateral position and rectal  examination performed.  No abnormality noted on external or digital  exam.  The Pentax videoscope was placed per rectum and advanced under  vision into sigmoid colon and beyond.  Preparation was excellent.  The  scope was passed into cecum which was identified by appendiceal orifice  and ileocecal valve.  Pictures taken for the record.  As the scope was  withdrawn, colonic mucosa was once again carefully examined.  There was  3 mm polyp at splenic flexure which was ablated via cold biopsy.  Mucosa  of the rest of the colon was normal.  Rectal mucosa similarly was  normal.  Scope was retroflexed to examine anorectal junction and small  hemorrhoids were noted below the dentate line.  Endoscope was  straightened and withdrawn.  The patient tolerated the procedure well.   FINAL DIAGNOSIS:  1. A 3 mm polyp ablated via cold biopsy from the splenic flexure.  2. External  hemorrhoids.   RECOMMENDATIONS:  Standard instructions given.  I will be contacting the  patient with results of biopsy.   She will need to return for next exam in five years given family history  of CRC.      Lionel December, M.D.  Electronically Signed     NR/MEDQ  D:  10/16/2006  T:  10/16/2006  Job:  [id number]   cc:   Tesfaye D. Felecia Shelling, MD  Fax: [phone]

## 2010-11-05 NOTE — Op Note (Signed)
NAMECYRENE, Tammy Kramer                         ACCOUNT NO.:  [id number]   MEDICAL RECORD NO.:  [id number]                   PATIENT TYPE:  AMB   LOCATION:  DAY                                  FACILITY:  [facility]   PHYSICIAN:  Vickki Hearing, M.D.           DATE OF BIRTH:  1950-08-10   DATE OF PROCEDURE:  01/23/2004  DATE OF DISCHARGE:                                 OPERATIVE REPORT   PREOPERATIVE DIAGNOSIS:  Spur, right proximal fifth metatarsal.   POSTOPERATIVE DIAGNOSIS:  Spur, right proximal fifth metatarsal.   PROCEDURE:  Excision of spur, right proximal fifth metatarsal.   SURGEON:  Vickki Hearing, M.D.   ANESTHESIA:  MAC with local block using 1% plain lidocaine.   INDICATIONS FOR PROCEDURE:  Pain and difficulty with shoe wear.   HISTORY:  The patient is a 60 year old female with pain over the right fifth  metatarsal at its proximal aspect.  Her bone appeared to flare at this area,  causing her to have difficulty with shoe wear.   We tried conservative treatment.  It did not work.  She presented for  excision of bone.   DESCRIPTION OF PROCEDURE:  [name] was identified in the preoperative  holding area.  Her right fifth metatarsal was marked as the operative site  by the surgeon, using his initials over her mark for indicated site of  surgery.  She was given Ancef and taken to the operating room for MAC  anesthesia with local 1% block done by the surgeon.  She had a sterile prep  and drape.  A timeout was taken.  The procedure was confirmed as an excision  of bone from the right fifth metatarsal, proximal aspect, on Ms. Dempsey  Hollenbach.   The tourniquet was elevated.  An incision was made over the fifth metatarsal  at its proximal aspect and taken down to the tendon of the peroneus brevis.  This tendon was subperiosteally dissected from its insertion on the fifth  metatarsal, and the bone was excised using an oscillating saw and bur.  Radiograph confirmed  the bone to be excised, the area to be smooth.  The  wound was irrigated.  The tendon fibers which were dissected from the bone  were repaired with #2 Ethibond suture.   The subcutaneous tissue was closed with Vicryl.  The skin was closed with  nylon.  The patient's dressing was applied, and an Ace bandage was loosely  applied around the foot.  The tourniquet was released.  The drapes were  removed.  The patient was taken to the recovery room in stable condition.  She was placed in a postoperative shoe where she can be weightbearing as  tolerated.  She is to follow up on Monday for a dressing.  Sutures out in 10  to 12 days.      ___________________________________________  Vickki Hearing, M.D.   SEH/MEDQ  D:  01/23/2004  T:  01/23/2004  Job:  [id number]

## 2011-03-14 DIAGNOSIS — I10 Essential (primary) hypertension: Secondary | ICD-10-CM | POA: Insufficient documentation

## 2011-03-14 DIAGNOSIS — K219 Gastro-esophageal reflux disease without esophagitis: Secondary | ICD-10-CM | POA: Insufficient documentation

## 2011-05-16 ENCOUNTER — Other Ambulatory Visit (HOSPITAL_COMMUNITY): Payer: Self-pay | Admitting: Internal Medicine

## 2011-05-16 DIAGNOSIS — Z139 Encounter for screening, unspecified: Secondary | ICD-10-CM

## 2011-06-23 ENCOUNTER — Ambulatory Visit: Payer: Self-pay | Admitting: Orthopedic Surgery

## 2011-08-01 DIAGNOSIS — K219 Gastro-esophageal reflux disease without esophagitis: Secondary | ICD-10-CM | POA: Diagnosis not present

## 2011-08-01 DIAGNOSIS — M1990 Unspecified osteoarthritis, unspecified site: Secondary | ICD-10-CM | POA: Diagnosis not present

## 2011-08-01 DIAGNOSIS — I10 Essential (primary) hypertension: Secondary | ICD-10-CM | POA: Diagnosis not present

## 2011-10-20 DIAGNOSIS — I10 Essential (primary) hypertension: Secondary | ICD-10-CM | POA: Diagnosis not present

## 2011-10-20 DIAGNOSIS — M2550 Pain in unspecified joint: Secondary | ICD-10-CM | POA: Diagnosis not present

## 2011-10-21 ENCOUNTER — Ambulatory Visit (HOSPITAL_COMMUNITY)
Admission: RE | Admit: 2011-10-21 | Discharge: 2011-10-21 | Disposition: A | Discharge status: Home / Self Care | Payer: Medicare Other | Source: Ambulatory Visit | Attending: Internal Medicine | Admitting: Internal Medicine

## 2011-10-21 DIAGNOSIS — Z139 Encounter for screening, unspecified: Secondary | ICD-10-CM

## 2011-10-21 DIAGNOSIS — Z1231 Encounter for screening mammogram for malignant neoplasm of breast: Secondary | ICD-10-CM | POA: Insufficient documentation

## 2012-02-27 DIAGNOSIS — J410 Simple chronic bronchitis: Secondary | ICD-10-CM | POA: Diagnosis not present

## 2012-02-27 DIAGNOSIS — I10 Essential (primary) hypertension: Secondary | ICD-10-CM | POA: Diagnosis not present

## 2012-05-22 DIAGNOSIS — M25519 Pain in unspecified shoulder: Secondary | ICD-10-CM | POA: Diagnosis not present

## 2012-06-14 ENCOUNTER — Ambulatory Visit: Payer: Medicare Other | Admitting: Orthopedic Surgery

## 2012-07-17 ENCOUNTER — Ambulatory Visit: Payer: Medicare Other | Admitting: Orthopedic Surgery

## 2012-07-23 DIAGNOSIS — Z23 Encounter for immunization: Secondary | ICD-10-CM | POA: Diagnosis not present

## 2012-07-25 ENCOUNTER — Ambulatory Visit: Payer: Medicare Other | Admitting: Orthopedic Surgery

## 2012-11-26 ENCOUNTER — Ambulatory Visit (HOSPITAL_COMMUNITY)
Admission: RE | Admit: 2012-11-26 | Discharge: 2012-11-26 | Disposition: A | Discharge status: Home / Self Care | Payer: Medicare Other | Source: Ambulatory Visit | Attending: Internal Medicine | Admitting: Internal Medicine

## 2012-11-26 ENCOUNTER — Other Ambulatory Visit (HOSPITAL_COMMUNITY): Payer: Self-pay | Admitting: Internal Medicine

## 2012-11-26 DIAGNOSIS — Z1231 Encounter for screening mammogram for malignant neoplasm of breast: Secondary | ICD-10-CM | POA: Diagnosis not present

## 2012-11-26 DIAGNOSIS — Z139 Encounter for screening, unspecified: Secondary | ICD-10-CM

## 2013-01-18 DIAGNOSIS — M2550 Pain in unspecified joint: Secondary | ICD-10-CM | POA: Diagnosis not present

## 2013-01-18 DIAGNOSIS — M1990 Unspecified osteoarthritis, unspecified site: Secondary | ICD-10-CM | POA: Diagnosis not present

## 2013-01-18 DIAGNOSIS — E7800 Pure hypercholesterolemia, unspecified: Secondary | ICD-10-CM | POA: Diagnosis not present

## 2013-01-18 DIAGNOSIS — I10 Essential (primary) hypertension: Secondary | ICD-10-CM | POA: Diagnosis not present

## 2013-04-22 DIAGNOSIS — Z23 Encounter for immunization: Secondary | ICD-10-CM | POA: Diagnosis not present

## 2013-04-22 DIAGNOSIS — I10 Essential (primary) hypertension: Secondary | ICD-10-CM | POA: Diagnosis not present

## 2013-04-22 DIAGNOSIS — E7800 Pure hypercholesterolemia, unspecified: Secondary | ICD-10-CM | POA: Diagnosis not present

## 2013-04-22 DIAGNOSIS — K219 Gastro-esophageal reflux disease without esophagitis: Secondary | ICD-10-CM | POA: Diagnosis not present

## 2013-08-01 DIAGNOSIS — K219 Gastro-esophageal reflux disease without esophagitis: Secondary | ICD-10-CM | POA: Diagnosis not present

## 2013-08-01 DIAGNOSIS — I10 Essential (primary) hypertension: Secondary | ICD-10-CM | POA: Diagnosis not present

## 2013-08-22 DIAGNOSIS — R7309 Other abnormal glucose: Secondary | ICD-10-CM | POA: Diagnosis not present

## 2013-08-22 DIAGNOSIS — I10 Essential (primary) hypertension: Secondary | ICD-10-CM | POA: Diagnosis not present

## 2013-08-22 DIAGNOSIS — K219 Gastro-esophageal reflux disease without esophagitis: Secondary | ICD-10-CM | POA: Diagnosis not present

## 2013-10-08 ENCOUNTER — Other Ambulatory Visit (HOSPITAL_COMMUNITY): Payer: Self-pay | Admitting: Internal Medicine

## 2013-10-08 DIAGNOSIS — Z1231 Encounter for screening mammogram for malignant neoplasm of breast: Secondary | ICD-10-CM

## 2013-11-28 ENCOUNTER — Ambulatory Visit (HOSPITAL_COMMUNITY)
Admission: RE | Admit: 2013-11-28 | Discharge: 2013-11-28 | Disposition: A | Discharge status: Home / Self Care | Payer: Medicare Other | Source: Ambulatory Visit | Attending: Internal Medicine | Admitting: Internal Medicine

## 2013-11-28 DIAGNOSIS — Z1231 Encounter for screening mammogram for malignant neoplasm of breast: Secondary | ICD-10-CM | POA: Diagnosis not present

## 2014-03-24 ENCOUNTER — Encounter: Payer: Self-pay | Admitting: Gastroenterology

## 2014-03-24 ENCOUNTER — Ambulatory Visit (INDEPENDENT_AMBULATORY_CARE_PROVIDER_SITE_OTHER): Payer: Medicare Other | Admitting: Gastroenterology

## 2014-03-24 ENCOUNTER — Other Ambulatory Visit: Payer: Self-pay

## 2014-03-24 VITALS — BP 136/89 | HR 105 | Temp 96.9°F | Ht 64.5 in | Wt 176.4 lb

## 2014-03-24 DIAGNOSIS — K5909 Other constipation: Secondary | ICD-10-CM | POA: Diagnosis not present

## 2014-03-24 DIAGNOSIS — K5900 Constipation, unspecified: Secondary | ICD-10-CM

## 2014-03-24 DIAGNOSIS — K625 Hemorrhage of anus and rectum: Secondary | ICD-10-CM

## 2014-03-24 MED ORDER — LUBIPROSTONE 8 MCG PO CAPS: 8.0000 ug | ORAL_CAPSULE | Freq: Two times a day (BID) | ORAL | Status: DC

## 2014-03-24 MED ORDER — PEG 3350-KCL-NA BICARB-NACL 420 G PO SOLR: 4000.0000 mL | ORAL | Status: DC

## 2014-03-24 NOTE — Progress Notes (Signed)
    Primary Care Physician:  FANTA,TESFAYE, MD Primary Gastroenterologist:  Dr. Rourk  Chief Complaint  Patient presents with  . Rectal Bleeding    HPI:   Tammy Kramer is a 63-year-old female presenting today with history of rectal bleeding, self-referral. May 2015 very constipated, saw paper hematochezia at that time. Last week constipated and had another episode. Has lower back discomfort, which is chronic. Concerned because brother had colon cancer at age 61 and had symptoms of lower back pain and died 2 weeks later. Chronic constipation. Has taken Metamucil in the past but ended up having nausea. Tried high fiber diet, which worked for awhile. No OTC agents currently. BM usually daily to every other day. Doesn't feel productive. Feels like there may be a little bit left. Occasional hard stool.   Prilosec for GERD. No dysphagia. Last colonoscopy in 2011 with Dr. Rourk without polyps.   Past Medical History  Diagnosis Date  . GERD (gastroesophageal reflux disease)   . Chronic constipation   . Hypercholesterolemia   . Hypertension     Past Surgical History  Procedure Laterality Date  . Colonoscopy   10/16/2006    NUR: 3 mm polyp ablated via cold biopsy from the splenic flexure.External hemorrhoids  . Esophagogastroduodenoscopy  06/14/2006    NUR: Normal esophagogastroduodenoscopy  . Cholecystectomy    . Abdominal hysterectomy    . Back surgery    . C4-c5 fusion    . Foot surgery      X 5  . Hand surgery      X 2  . Rotator cuff repair      Current Outpatient Prescriptions  Medication Sig Dispense Refill  . loratadine (CLARITIN) 10 MG tablet Take 10 mg by mouth daily.      . omeprazole (PRILOSEC) 20 MG capsule Take 20 mg by mouth daily.      . valsartan-hydrochlorothiazide (DIOVAN-HCT) 160-12.5 MG per tablet Take 1 tablet by mouth daily.       No current facility-administered medications for this visit.    Allergies as of 03/24/2014 - Review Complete 03/24/2014   Allergen Reaction Noted  . Codeine    . Meperidine hcl      Family History  Problem Relation Age of Onset  . Colon cancer Brother     age 61, deceased 2 weeks later    History   Social History  . Marital Status: Divorced    Spouse Name: N/A    Number of Children: N/A  . Years of Education: N/A   Occupational History  . Not on file.   Social History Main Topics  . Smoking status: Never Smoker   . Smokeless tobacco: Not on file     Comment: Never smoked  . Alcohol Use: No  . Drug Use: No  . Sexual Activity: Not on file   Other Topics Concern  . Not on file   Social History Narrative  . No narrative on file    Review of Systems: As mentioned in HPI.   Physical Exam: BP 136/89  Pulse 105  Temp(Src) 96.9 F (36.1 C) (Oral)  Ht 5' 4.5" (1.638 m)  Wt 176 lb 6.4 oz (80.015 kg)  BMI 29.82 kg/m2 General:   Alert and oriented. Pleasant and cooperative. Well-nourished and well-developed.  Head:  Normocephalic and atraumatic. Eyes:  Without icterus, sclera clear and conjunctiva pink.  Ears:  Normal auditory acuity. Nose:  No deformity, discharge,  or lesions. Mouth:  No deformity or lesions,   oral mucosa pink.  Lungs:  Clear to auscultation bilaterally. No wheezes, rales, or rhonchi. No distress.  Heart:  S1, S2 present without murmurs appreciated.  Abdomen:  +BS, soft, non-tender and non-distended. No HSM noted. No guarding or rebound. No masses appreciated.  Rectal:  Deferred  Msk:  Symmetrical without gross deformities. Normal posture. Extremities:  Without clubbing or edema. Neurologic:  Alert and  oriented x4;  grossly normal neurologically. Skin:  Intact without significant lesions or rashes. Psych:  Alert and cooperative. Normal mood and affect.

## 2014-03-24 NOTE — Patient Instructions (Signed)
For constipation, start taking Amitiza 1 gelcap WITH MEALS each evening for 3 evenings. If you do well with this, increase to twice a day WITH FOOD.   We have scheduled you for a colonoscopy with Dr. Jena Gaussourk in the near future.

## 2014-03-25 ENCOUNTER — Encounter: Payer: Self-pay | Admitting: Gastroenterology

## 2014-03-25 NOTE — Progress Notes (Signed)
cc'ed to pcp °

## 2014-03-25 NOTE — Assessment & Plan Note (Signed)
Chronic. Start Amitiza po. Samples provided and prescription sent to pharmacy.

## 2014-03-25 NOTE — Assessment & Plan Note (Signed)
63 year old female with intermittent low-volume hematochezia in the setting of chronic constipation, with last colonoscopy normal in 2011. However, she does have a family history of colon cancer in her brother, who was diagnosed at age 64 and deceased several weeks later. Due to rectal bleeding, recommend early interval colonoscopy (she would have been due 2016), more aggressive treatment of constipation.   Proceed with TCS with Dr. Jena Gaussourk in near future: the risks, benefits, and alternatives have been discussed with the patient in detail. The patient states understanding and desires to proceed.

## 2014-04-03 ENCOUNTER — Encounter (HOSPITAL_COMMUNITY): Payer: Self-pay | Admitting: Pharmacy Technician

## 2014-04-08 ENCOUNTER — Encounter (HOSPITAL_COMMUNITY)
Admission: RE | Disposition: A | Discharge status: Home / Self Care | Payer: Self-pay | Source: Ambulatory Visit | Attending: Internal Medicine

## 2014-04-08 ENCOUNTER — Ambulatory Visit (HOSPITAL_COMMUNITY)
Admission: RE | Admit: 2014-04-08 | Discharge: 2014-04-08 | Disposition: A | Discharge status: Home / Self Care | Payer: Medicare Other | Source: Ambulatory Visit | Attending: Internal Medicine | Admitting: Internal Medicine

## 2014-04-08 ENCOUNTER — Encounter (HOSPITAL_COMMUNITY): Payer: Self-pay | Admitting: *Deleted

## 2014-04-08 DIAGNOSIS — K219 Gastro-esophageal reflux disease without esophagitis: Secondary | ICD-10-CM | POA: Diagnosis not present

## 2014-04-08 DIAGNOSIS — I10 Essential (primary) hypertension: Secondary | ICD-10-CM | POA: Diagnosis not present

## 2014-04-08 DIAGNOSIS — K640 First degree hemorrhoids: Secondary | ICD-10-CM

## 2014-04-08 DIAGNOSIS — K625 Hemorrhage of anus and rectum: Secondary | ICD-10-CM | POA: Diagnosis not present

## 2014-04-08 DIAGNOSIS — K648 Other hemorrhoids: Secondary | ICD-10-CM | POA: Diagnosis not present

## 2014-04-08 DIAGNOSIS — K921 Melena: Secondary | ICD-10-CM | POA: Insufficient documentation

## 2014-04-08 DIAGNOSIS — E780 Pure hypercholesterolemia: Secondary | ICD-10-CM | POA: Insufficient documentation

## 2014-04-08 DIAGNOSIS — K5730 Diverticulosis of large intestine without perforation or abscess without bleeding: Secondary | ICD-10-CM | POA: Insufficient documentation

## 2014-04-08 DIAGNOSIS — Z79899 Other long term (current) drug therapy: Secondary | ICD-10-CM | POA: Diagnosis not present

## 2014-04-08 DIAGNOSIS — Z800 Family history of malignant neoplasm of digestive organs: Secondary | ICD-10-CM | POA: Insufficient documentation

## 2014-04-08 DIAGNOSIS — K5900 Constipation, unspecified: Secondary | ICD-10-CM

## 2014-04-08 HISTORY — PX: COLONOSCOPY: SHX5424

## 2014-04-08 SURGERY — COLONOSCOPY
Anesthesia: Moderate Sedation

## 2014-04-08 MED ORDER — MIDAZOLAM HCL 5 MG/5ML IJ SOLN
INTRAMUSCULAR | Status: AC
  Filled 2014-04-08: qty 10

## 2014-04-08 MED ORDER — SODIUM CHLORIDE 0.9 % IV SOLN
INTRAVENOUS | Status: DC
  Administered 2014-04-08: 11:00:00 via INTRAVENOUS

## 2014-04-08 MED ORDER — STERILE WATER FOR IRRIGATION IR SOLN
Status: DC | PRN
  Administered 2014-04-08: 12:00:00

## 2014-04-08 MED ORDER — ONDANSETRON HCL 4 MG/2ML IJ SOLN
INTRAMUSCULAR | Status: AC
  Filled 2014-04-08: qty 2

## 2014-04-08 MED ORDER — FENTANYL CITRATE 0.05 MG/ML IJ SOLN
INTRAMUSCULAR | Status: AC
  Filled 2014-04-08: qty 2

## 2014-04-08 MED ORDER — FENTANYL CITRATE 0.05 MG/ML IJ SOLN
INTRAMUSCULAR | Status: DC | PRN
  Administered 2014-04-08: 25 ug via INTRAVENOUS
  Administered 2014-04-08: 50 ug via INTRAVENOUS
  Administered 2014-04-08: 25 ug via INTRAVENOUS

## 2014-04-08 MED ORDER — MIDAZOLAM HCL 5 MG/5ML IJ SOLN
INTRAMUSCULAR | Status: DC | PRN
  Administered 2014-04-08 (×3): 1 mg via INTRAVENOUS
  Administered 2014-04-08: 2 mg via INTRAVENOUS

## 2014-04-08 MED ORDER — ONDANSETRON HCL 4 MG/2ML IJ SOLN
INTRAMUSCULAR | Status: DC | PRN
  Administered 2014-04-08: 4 mg via INTRAVENOUS

## 2014-04-08 NOTE — H&P (View-Only) (Signed)
Primary Care Physician:  Avon GullyFANTA,TESFAYE, MD Primary Gastroenterologist:  Dr. Jena Gaussourk  Chief Complaint  Patient presents with  . Rectal Bleeding    HPI:   Tammy Kramer is a 63 year old female presenting today with history of rectal bleeding, self-referral. May 2015 very constipated, saw paper hematochezia at that time. Last week constipated and had another episode. Has lower back discomfort, which is chronic. Concerned because brother had colon cancer at age 64 and had symptoms of lower back pain and died 2 weeks later. Chronic constipation. Has taken Metamucil in the past but ended up having nausea. Tried high fiber diet, which worked for [employer]. No OTC agents currently. BM usually daily to every other day. Doesn't feel productive. Feels like there may be a little bit left. Occasional hard stool.   Prilosec for GERD. No dysphagia. Last colonoscopy in 2011 with Dr. Jena Gaussourk without polyps.   Past Medical History  Diagnosis Date  . GERD (gastroesophageal reflux disease)   . Chronic constipation   . Hypercholesterolemia   . Hypertension     Past Surgical History  Procedure Laterality Date  . Colonoscopy   10/16/2006    NUR: 3 mm polyp ablated via cold biopsy from the splenic flexure.External hemorrhoids  . Esophagogastroduodenoscopy  06/14/2006    NUR: Normal esophagogastroduodenoscopy  . Cholecystectomy    . Abdominal hysterectomy    . Back surgery    . C4-c5 fusion    . Foot surgery      X 5  . Hand surgery      X 2  . Rotator cuff repair      Current Outpatient Prescriptions  Medication Sig Dispense Refill  . loratadine (CLARITIN) 10 MG tablet Take 10 mg by mouth daily.      Marland Kitchen. omeprazole (PRILOSEC) 20 MG capsule Take 20 mg by mouth daily.      . valsartan-hydrochlorothiazide (DIOVAN-HCT) 160-12.5 MG per tablet Take 1 tablet by mouth daily.       No current facility-administered medications for this visit.    Allergies as of 03/24/2014 - Review Complete 03/24/2014   Allergen Reaction Noted  . Codeine    . Meperidine hcl      Family History  Problem Relation Age of Onset  . Colon cancer Brother     age 64, deceased 2 weeks later    History   Social History  . Marital Status: Divorced    Spouse Name: N/A    Number of Children: N/A  . Years of Education: N/A   Occupational History  . Not on file.   Social History Main Topics  . Smoking status: Never Smoker   . Smokeless tobacco: Not on file     Comment: Never smoked  . Alcohol Use: No  . Drug Use: No  . Sexual Activity: Not on file   Other Topics Concern  . Not on file   Social History Narrative  . No narrative on file    Review of Systems: As mentioned in HPI.   Physical Exam: BP 136/89  Pulse 105  Temp(Src) 96.9 F (36.1 C) (Oral)  Ht 5' 4.5" (1.638 m)  Wt 176 lb 6.4 oz (80.015 kg)  BMI 29.82 kg/m2 General:   Alert and oriented. Pleasant and cooperative. Well-nourished and well-developed.  Head:  Normocephalic and atraumatic. Eyes:  Without icterus, sclera clear and conjunctiva pink.  Ears:  Normal auditory acuity. Nose:  No deformity, discharge,  or lesions. Mouth:  No deformity or lesions,  oral mucosa pink.  Lungs:  Clear to auscultation bilaterally. No wheezes, rales, or rhonchi. No distress.  Heart:  S1, S2 present without murmurs appreciated.  Abdomen:  +BS, soft, non-tender and non-distended. No HSM noted. No guarding or rebound. No masses appreciated.  Rectal:  Deferred  Msk:  Symmetrical without gross deformities. Normal posture. Extremities:  Without clubbing or edema. Neurologic:  Alert and  oriented x4;  grossly normal neurologically. Skin:  Intact without significant lesions or rashes. Psych:  Alert and cooperative. Normal mood and affect.

## 2014-04-08 NOTE — Discharge Instructions (Signed)
°  Colonoscopy Discharge Instructions  Read the instructions outlined below and refer to this sheet in the next few weeks. These discharge instructions provide you with general information on caring for yourself after you leave the hospital. Your doctor may also give you specific instructions. While your treatment has been planned according to the most current medical practices available, unavoidable complications occasionally occur. If you have any problems or questions after discharge, call Dr. Jena Gaussourk at [phone]. ACTIVITY  You may resume your regular activity, but move at a slower pace for the next 24 hours.   Take frequent rest periods for the next 24 hours.   Walking will help get rid of the air and reduce the bloated feeling in your belly (abdomen).   No driving for 24 hours (because of the medicine (anesthesia) used during the test).    Do not sign any important legal documents or operate any machinery for 24 hours (because of the anesthesia used during the test).  NUTRITION  Drink plenty of fluids.   You may resume your normal diet as instructed by your doctor.   Begin with a light meal and progress to your normal diet. Heavy or fried foods are harder to digest and may make you feel sick to your stomach (nauseated).   Avoid alcoholic beverages for 24 hours or as instructed.  MEDICATIONS  You may resume your normal medications unless your doctor tells you otherwise.  WHAT YOU CAN EXPECT TODAY  Some feelings of bloating in the abdomen.   Passage of more gas than usual.   Spotting of blood in your stool or on the toilet paper.  IF YOU HAD POLYPS REMOVED DURING THE COLONOSCOPY:  No aspirin products for 7 days or as instructed.   No alcohol for 7 days or as instructed.   Eat a soft diet for the next 24 hours.  FINDING OUT THE RESULTS OF YOUR TEST Not all test results are available during your visit. If your test results are not back during the visit, make an appointment  with your caregiver to find out the results. Do not assume everything is normal if you have not heard from your caregiver or the medical facility. It is important for you to follow up on all of your test results.  SEEK IMMEDIATE MEDICAL ATTENTION IF:  You have more than a spotting of blood in your stool.   Your belly is swollen (abdominal distention).   You are nauseated or vomiting.   You have a temperature over 101.   You have abdominal pain or discomfort that is severe or gets worse throughout the day.    Increase Amitiza to 8 micrograms twice daily-during meals  Add Benefiber 2 teaspoons twice daily to your regimen.  Office visit in 3 months  If bleeding persists, may need hemorrhoid banding  Repeat colonoscopy in 5 years.

## 2014-04-08 NOTE — Interval H&P Note (Signed)
History and Physical Interval Note:  04/08/2014 11:45 AM  Tammy Kramer  has presented today for surgery, with the diagnosis of rectal bleeding/constpation  The various methods of treatment have been discussed with the patient and family. After consideration of risks, benefits and other options for treatment, the patient has consented to  Procedure(s) with comments: COLONOSCOPY (N/A) - 1145 as a surgical intervention .  The patient's history has been reviewed, patient examined, no change in status, stable for surgery.  I have reviewed the patient's chart and labs.  Questions were answered to the patient's satisfaction.     Amitiza in the evening has helped with constipation to some degree. Has not gone to twice a day as of yet; otherwise, no change. Colonoscopy per plan.  The risks, benefits, limitations, alternatives and imponderables have been reviewed with the patient. Questions have been answered. All parties are agreeable.  Eula Listenobert Rourk

## 2014-04-08 NOTE — Op Note (Signed)
[facility] [address] [place], [id number]   COLONOSCOPY PROCEDURE REPORT  PATIENT: Tammy NumbersGraves, Ingrid S  MR#: [id number] BIRTHDATE: 1950/11/26 , 63  yrs. old GENDER: female ENDOSCOPIST: R.  Roetta SessionsMichael Latoshia Monrroy, MD FACP [facility]in [facility] REFERRED XB:JYNWGNFABY:Tesafaye Felecia ShellingFanta, M.D. PROCEDURE DATE:  04/08/2014 PROCEDURE:   Colonoscopy, diagnostic INDICATIONS:hematochezia. MEDICATIONS: Versed 5 mg IV and fentanyl 100 mcg IV in divided doses.  Zofran 4 mg IV. ASA CLASS:       Class II  CONSENT: The risks, benefits, alternatives and imponderables including but not limited to bleeding, perforation as well as the possibility of a missed lesion have been reviewed.  The potential for biopsy, lesion removal, etc. have also been discussed. Questions have been answered.  All parties agreeable.  Please see the history and physical in the medical record for more information.  DESCRIPTION OF PROCEDURE:   After the risks benefits and alternatives of the procedure were thoroughly explained, informed consent was obtained.  The digital rectal exam revealed no abnormalities of the rectum.   The EC-[id number] ([id number]  endoscope was introduced through the anus and advanced to the cecum, which was identified by both the appendix and ileocecal valve. No adverse events experienced.   The quality of the prep was adequate.  The instrument was then slowly withdrawn as the colon was fully examined.      COLON FINDINGS: Patient had 3 diverticula about the ileocecal valve; otherwise, the remainder of the colonic mucosa appeared normal. The rectal mucosa appeared normal except for internal hemorrhoids. Retroflexion was performed. .  Withdrawal time=7 minutes 0 seconds.  The scope was withdrawn and the procedure completed. COMPLICATIONS: There were no immediate complications.  ENDOSCOPIC IMPRESSION: Internal hemorrhoids; colonic diverticulosis (few as described above) hematochezia likely secondary to hemorrhoids in  the setting of constipation.  RECOMMENDATIONS: Increase Amitiza 8 mcg twice daily (during meals).  Add Benefiber 2 teaspoons twice daily  Office visit with us in 3 months; if hematochezia recurs, patient will likely be a good hemorrhoid banding candidate.  She should have another colonoscopy in 5 years for high risk rating purposes.   eSigned:  R. Roetta SessionsMichael Moriah Loughry, MD Jerrel IvoryFACP [facility] 04/08/2014 12:27 PM   cc:  CPT CODES: ICD CODES:  The ICD and CPT codes recommended by this software are interpretations from the data that the clinical staff has captured with the software.  The verification of the translation of this report to the ICD and CPT codes and modifiers is the sole responsibility of the health care institution and practicing physician where this report was generated.  [employer] will not be held responsible for the validity of the ICD and CPT codes included on this report.  AMA assumes no liability for data contained or not contained herein. CPT is a Publishing rights managerregistered trademark of the [employer].  PATIENT NAMYetta Kramer:  Massoud, Ahnesty S MR#: [id number]

## 2014-04-10 ENCOUNTER — Encounter (HOSPITAL_COMMUNITY): Payer: Self-pay | Admitting: Internal Medicine

## 2014-04-17 ENCOUNTER — Encounter: Payer: Self-pay | Admitting: Internal Medicine

## 2014-04-18 DIAGNOSIS — K219 Gastro-esophageal reflux disease without esophagitis: Secondary | ICD-10-CM | POA: Diagnosis not present

## 2014-04-18 DIAGNOSIS — N3281 Overactive bladder: Secondary | ICD-10-CM | POA: Diagnosis not present

## 2014-04-18 DIAGNOSIS — I10 Essential (primary) hypertension: Secondary | ICD-10-CM | POA: Diagnosis not present

## 2014-04-18 DIAGNOSIS — E785 Hyperlipidemia, unspecified: Secondary | ICD-10-CM | POA: Diagnosis not present

## 2014-05-07 ENCOUNTER — Telehealth: Payer: Self-pay | Admitting: Internal Medicine

## 2014-05-07 NOTE — Telephone Encounter (Signed)
Pt called today to speak with nurse about problems she has been having since her procedure in OCT by RMR. She said since procedure she has been having flare ups with her bladder and she has seen Dr Felecia ShellingFanta about it and has tried sample medications he gave her, but doesn't seem to be helping. She would like to speak with the nurse about her concerns.  Please call [phone]

## 2014-05-09 NOTE — Telephone Encounter (Signed)
Tried to call pt- NA and pt does not have a voicemail.

## 2014-05-13 NOTE — Telephone Encounter (Signed)
Her new symptoms would be coincidental to the colonoscopy. If they persist, she probably needs to see a urologist

## 2014-05-13 NOTE — Telephone Encounter (Signed)
I spoke with the pt- she said 2 days after her procedure she started noticing that she was having to urinate more frequently than normal, then she noticed she was having some urinary incontinence any time she would cough or laugh. She went to Dr.Fanta and he checked a UA, which was normal and gave her some samples of toviaz to try for a week. Pt said she took them and it seemed to help some. She has been watching what she is eating and drinking. She is not sure if this is related to her procedure or if it is just a coincidence that it started after her procedure, but she wanted to check with us because she didn't know what to do next.

## 2014-05-14 NOTE — Telephone Encounter (Signed)
Tried to call pt- LMOM with recommendations  

## 2014-06-20 HISTORY — PX: HEMORRHOID BANDING: SHX5850

## 2014-06-30 DIAGNOSIS — M25551 Pain in right hip: Secondary | ICD-10-CM | POA: Diagnosis not present

## 2014-06-30 DIAGNOSIS — I10 Essential (primary) hypertension: Secondary | ICD-10-CM | POA: Diagnosis not present

## 2014-07-09 ENCOUNTER — Ambulatory Visit: Payer: Medicare Other | Admitting: Gastroenterology

## 2014-07-15 ENCOUNTER — Encounter: Payer: Self-pay | Admitting: Orthopedic Surgery

## 2014-07-15 ENCOUNTER — Other Ambulatory Visit: Payer: Self-pay | Admitting: Orthopedic Surgery

## 2014-07-15 ENCOUNTER — Ambulatory Visit (INDEPENDENT_AMBULATORY_CARE_PROVIDER_SITE_OTHER): Payer: Medicare Other | Admitting: Orthopedic Surgery

## 2014-07-15 ENCOUNTER — Ambulatory Visit (HOSPITAL_COMMUNITY)
Admission: RE | Admit: 2014-07-15 | Discharge: 2014-07-15 | Disposition: A | Discharge status: Home / Self Care | Payer: Medicare Other | Source: Ambulatory Visit | Attending: Orthopedic Surgery | Admitting: Orthopedic Surgery

## 2014-07-15 VITALS — BP 142/86 | Ht 64.5 in | Wt 174.0 lb

## 2014-07-15 DIAGNOSIS — G8929 Other chronic pain: Secondary | ICD-10-CM | POA: Insufficient documentation

## 2014-07-15 DIAGNOSIS — M25551 Pain in right hip: Secondary | ICD-10-CM

## 2014-07-15 DIAGNOSIS — M79604 Pain in right leg: Secondary | ICD-10-CM

## 2014-07-15 DIAGNOSIS — M47817 Spondylosis without myelopathy or radiculopathy, lumbosacral region: Secondary | ICD-10-CM | POA: Diagnosis not present

## 2014-07-15 DIAGNOSIS — M47816 Spondylosis without myelopathy or radiculopathy, lumbar region: Secondary | ICD-10-CM | POA: Diagnosis not present

## 2014-07-15 DIAGNOSIS — M4316 Spondylolisthesis, lumbar region: Secondary | ICD-10-CM | POA: Diagnosis not present

## 2014-07-15 DIAGNOSIS — M545 Low back pain: Secondary | ICD-10-CM | POA: Diagnosis not present

## 2014-07-15 DIAGNOSIS — M5431 Sciatica, right side: Secondary | ICD-10-CM

## 2014-07-15 DIAGNOSIS — M5136 Other intervertebral disc degeneration, lumbar region: Secondary | ICD-10-CM | POA: Insufficient documentation

## 2014-07-15 DIAGNOSIS — M8938 Hypertrophy of bone, other site: Secondary | ICD-10-CM | POA: Insufficient documentation

## 2014-07-15 MED ORDER — GABAPENTIN 100 MG PO CAPS: 100.0000 mg | ORAL_CAPSULE | Freq: Three times a day (TID) | ORAL | Status: DC

## 2014-07-15 MED ORDER — PREDNISONE (PAK) 5 MG PO TABS: ORAL_TABLET | ORAL | Status: DC

## 2014-07-15 NOTE — Progress Notes (Signed)
Patient ID: Tammy Kramer, female   DOB: 1951/05/27, 64 y.o.   MRN: [id number] Patient ID: Tammy Kramer, female   DOB: 1951-03-07, 64 y.o.   MRN: [id number]  Chief Complaint  Patient presents with  . Leg Pain    right leg pain that radiates from right buttock, REF FANTA    HPI Tammy Kramer is a 64 y.o. female.  The patient is cervical disc procedure many years ago unrelated to this current chief complaint of right hip pain going down the right leg for the last 3 weeks. Although she's made some improvement she initially had weakness in her right leg and severe pain. She was treated with over-the-counter medication and tramadol with some relief but the tramadol gave her headache and she had to stop taking it. The pain started in her right hip and lower back and radiated to her right leg and foot. She described as sharp radiating constant pain which reached a level of 10 out of 10 now 6 out of 10. No previous treatment other than described  Review of Systems Review of Systems  Eyes: Positive for pain.   1. Seasonal allergy 2. Musculoskeletal burning pain in the leg #3 sinus problems #4 constipation #5 denies bowel or bladder Abnormality   Past Medical History  Diagnosis Date  . GERD (gastroesophageal reflux disease)   . Chronic constipation   . Hypercholesterolemia   . Hypertension     Past Surgical History  Procedure Laterality Date  . Colonoscopy   10/16/2006    NUR: 3 mm polyp ablated via cold biopsy from the splenic flexure.External hemorrhoids, hyperplastic polyp  . Esophagogastroduodenoscopy  06/14/2006    NUR: Normal esophagogastroduodenoscopy  . Cholecystectomy    . Abdominal hysterectomy    . Back surgery    . C4-c5 fusion    . Foot surgery      X 5  . Hand surgery      X 2  . Rotator cuff repair    . Colonoscopy  Feb 2011    Dr. Jena Gauss: internal hemorrhoids, otherwise normal.   . Esophagogastroduodenoscopy  Feb 2011    Dr. Jena Gauss: inflamed-appearing esophagus  with overlying distal esophageal erosions superimposed on noncritical Schatzki ring, s/p dilation and biopsy. Benign path  . Colonoscopy N/A 04/08/2014    Procedure: COLONOSCOPY;  Surgeon: Corbin Ade, MD;  [facility]: [facility];  Service: Endoscopy;  Laterality: N/A;  1145    Social History History  Substance Use Topics  . Smoking status: Never Smoker   . Smokeless tobacco: Not on file     Comment: Never smoked  . Alcohol Use: No    Allergies  Allergen Reactions  . Codeine   . Meperidine Hcl     Current Outpatient Prescriptions  Medication Sig Dispense Refill  . lansoprazole (PREVACID) 30 MG capsule Take 30 mg by mouth daily at 12 noon.    . loratadine (CLARITIN) 10 MG tablet Take 10 mg by mouth daily.    Marland Kitchen lubiprostone (AMITIZA) 8 MCG capsule Take 1 capsule (8 mcg total) by mouth 2 (two) times daily with a meal. 60 capsule 3  . simvastatin (ZOCOR) 20 MG tablet Take 20 mg by mouth daily.    . valsartan-hydrochlorothiazide (DIOVAN-HCT) 160-12.5 MG per tablet Take 1 tablet by mouth daily.    Marland Kitchen gabapentin (NEURONTIN) 100 MG capsule Take 1 capsule (100 mg total) by mouth 3 (three) times daily. 30 capsule 0  . predniSONE (STERAPRED UNI-PAK) 5 MG TABS  tablet Use as directed 48 tablet 0   No current facility-administered medications for this visit.      Physical Exam Physical Exam Blood pressure 142/86, height 5' 4.5" (1.638 m), weight 174 lb (78.926 kg).  Gen. appearance normal  The patient is alert and oriented person place and time Mood is normal affect is normal Ambulatory status normal no asst device   BP 142/86 mmHg  Ht 5' 4.5" (1.638 m)  Wt 174 lb (78.926 kg)  BMI 29.42 kg/m2   Inspection lumbar spine no deformity is seen tenderness is noted at the base of the spine and in the right buttock  The lower extremities right and left have normal range of motion stability and strength  Normal skin cervical thoracic lumbar spine and both legs  Distal pulses are  symmetric and intact 2+ no edema  Neurologic side showed no tension signs, equal reflexes knee and ankle. Dorsiflexion plantar flexion strength normal      Data Reviewed She had x-rays done of her back and hip and I interpreted those as lumbar disc disease mild no hip disease  Assessment    Encounter Diagnoses  Name Primary?  . Sciatica associated with disorder of lumbar spine, right Yes  . Degenerative disc disease, lumbar         Plan    Recommend medical management   Meds ordered this encounter  Medications  . lansoprazole (PREVACID) 30 MG capsule    Sig: Take 30 mg by mouth daily at 12 noon.  . gabapentin (NEURONTIN) 100 MG capsule    Sig: Take 1 capsule (100 mg total) by mouth 3 (three) times daily.    Dispense:  30 capsule    Refill:  0  . predniSONE (STERAPRED UNI-PAK) 5 MG TABS tablet    Sig: Use as directed    Dispense:  48 tablet    Refill:  0   Follow-up in a couple of weeks if no improvement we will order MRI       Fuller CanadaStanley Harshaan Whang 07/15/2014, 2:50 PM

## 2014-07-29 ENCOUNTER — Ambulatory Visit (INDEPENDENT_AMBULATORY_CARE_PROVIDER_SITE_OTHER): Payer: Medicare Other | Admitting: Nurse Practitioner

## 2014-07-29 ENCOUNTER — Encounter: Payer: Self-pay | Admitting: Nurse Practitioner

## 2014-07-29 VITALS — BP 151/86 | HR 90 | Temp 97.6°F | Ht 64.5 in | Wt 187.6 lb

## 2014-07-29 DIAGNOSIS — K649 Unspecified hemorrhoids: Secondary | ICD-10-CM

## 2014-07-29 DIAGNOSIS — K5909 Other constipation: Secondary | ICD-10-CM

## 2014-07-29 NOTE — Progress Notes (Signed)
Referring Provider: Avon Gully, MD Primary Care Physician:  Avon Gully, MD Primary GI: Dr. Jena Gauss  Chief Complaint  Patient presents with  . Follow-up    HPI:   64 year old female presents for follow-up after colonoscopy for rectal bleeding. Was having paper hematochezia, TCS found internal hemorrhoids, few colonic diverticuloses about the ileocecal valve. Decided low volume hematochezia likely due to hemorrhoids in setting of constipation. Amitiza increased to bid and recommended follow-up in 3 months and if continued hematochezia would be a good candidate for hemorrhoid banding. Recommended repeat colonoscopy in 5 years due to high risk family history.  Today she states she has had some more tissue hematochezia, last episode about 2 weeks ago. Has had some recurrence of constipation which she attributes to pain medication temporarily prescribed for back pain. At that point she went back to bid Amitiza, Miralax, and dulcolax which resulted in a bowel movement. Was restarted on prednisone and gabapentin by PCP and feels like this is stopping her up again. Also with bloating. Denies abdominal pain, N/V. Bloating is associated with eating. When she has a bowel movement, the initial stool is hard with straining and subsequent stool is softer. Is trying to drink as much water as she can. Eats frequent vegetables. Overall states she has been doing well, but symptoms have started to return in the past week. Denies any other upper or lower GI symptoms.   Past Medical History  Diagnosis Date  . GERD (gastroesophageal reflux disease)   . Chronic constipation   . Hypercholesterolemia   . Hypertension     Past Surgical History  Procedure Laterality Date  . Colonoscopy   10/16/2006    NUR: 3 mm polyp ablated via cold biopsy from the splenic flexure.External hemorrhoids, hyperplastic polyp  . Esophagogastroduodenoscopy  06/14/2006    NUR: Normal esophagogastroduodenoscopy  .  Cholecystectomy    . Abdominal hysterectomy    . Back surgery    . C4-c5 fusion    . Foot surgery      X 5  . Hand surgery      X 2  . Rotator cuff repair    . Colonoscopy  Feb 2011    Dr. Jena Gauss: internal hemorrhoids, otherwise normal.   . Esophagogastroduodenoscopy  Feb 2011    Dr. Jena Gauss: inflamed-appearing esophagus with overlying distal esophageal erosions superimposed on noncritical Schatzki ring, s/p dilation and biopsy. Benign path  . Colonoscopy N/A 04/08/2014    Procedure: COLONOSCOPY;  Surgeon: Corbin Ade, MD;  Location: [facility];  Service: Endoscopy;  Laterality: N/A;  1145    Current Outpatient Prescriptions  Medication Sig Dispense Refill  . gabapentin (NEURONTIN) 100 MG capsule Take 1 capsule (100 mg total) by mouth 3 (three) times daily. 30 capsule 0  . lansoprazole (PREVACID) 30 MG capsule Take 30 mg by mouth daily at 12 noon.    . loratadine (CLARITIN) 10 MG tablet Take 10 mg by mouth daily.    Marland Kitchen lubiprostone (AMITIZA) 8 MCG capsule Take 1 capsule (8 mcg total) by mouth 2 (two) times daily with a meal. 60 capsule 3  . predniSONE (STERAPRED UNI-PAK) 5 MG TABS tablet Use as directed 48 tablet 0  . simvastatin (ZOCOR) 20 MG tablet Take 20 mg by mouth daily.    . valsartan-hydrochlorothiazide (DIOVAN-HCT) 160-12.5 MG per tablet Take 1 tablet by mouth daily.     No current facility-administered medications for this visit.    Allergies as of 07/29/2014 - Review Complete 07/29/2014  Allergen Reaction Noted  . Codeine    . Meperidine hcl      Family History  Problem Relation Age of Onset  . Colon cancer Brother     age 65, deceased 2 weeks later    History   Social History  . Marital Status: Divorced    Spouse Name: N/A    Number of Children: N/A  . Years of Education: N/A   Social History Main Topics  . Smoking status: Never Smoker   . Smokeless tobacco: None     Comment: Never smoked  . Alcohol Use: No  . Drug Use: No  . Sexual Activity: None    Other Topics Concern  . None   Social History Narrative    Review of Systems: Gen: Denies fever, chills, anorexia. Denies fatigue, weakness, weight loss.  CV: Denies chest pain, palpitations, syncope, peripheral edema. Resp: Denies dyspnea at rest, wheezing. GI: See HPI. Denies fecal incontinence.   Denies dysphagia or odynophagia. Derm: Denies rash, itching, dry skin Psych: Denies depression, anxiety, memory loss, confusion.  Heme: Denies bruising, bleeding, and enlarged lymph nodes.  Physical Exam: BP 151/86 mmHg  Pulse 90  Temp(Src) 97.6 F (36.4 C) (Oral)  Ht 5' 4.5" (1.638 m)  Wt 187 lb 9.6 oz (85.095 kg)  BMI 31.72 kg/m2 General:   Alert and oriented. No distress noted. Pleasant and cooperative.  Head:  Normocephalic and atraumatic. Eyes:  Conjuctiva clear without scleral icterus. Mouth:  Oral mucosa pink and moist. Good dentition. No lesions. Neck:  Supple, without mass or thyromegaly. Lungs:  Clear to auscultation bilaterally. No wheezes, rales, or rhonchi. No distress.  Heart:  S1, S2 present without murmurs, rubs, or gallops. Regular rate and rhythm. Abdomen:  +BS, soft, non-tender and non-distended. No rebound or guarding. No HSM or masses noted. Msk:  Symmetrical without gross deformities. Normal posture. Pulses:  2+ DP noted bilaterally Extremities:  Without edema. Neurologic:  Alert and  oriented x4;  grossly normal neurologically. Skin:  Intact without significant lesions or rashes. Cervical Nodes:  No significant cervical adenopathy. Psych:  Alert and cooperative. Normal mood and affect.    07/29/2014 1:42 PM

## 2014-07-29 NOTE — Patient Instructions (Addendum)
1. Increase your Miralax to daily for the next week to see if it helps your temporary worsening of constipation. 2. Continue Amitiza twice a day 3. If you notice more bleeding and decide you'd like to pursue hemorrhoid banding, call us and we can schedule the appointment for you. 4. Follow-up in 3 months so we can see how you've progressed and make any changes that may be needed.

## 2014-07-29 NOTE — Assessment & Plan Note (Addendum)
Was much improved on bid Amitiza, has had a recent likely temporary worsening of her symptoms after being rx Ultram for back pain. Will have her increase her Miralax to 17 gm daily for the next week and ensure adequate water intake to see if that helps with her temporary exacerbation. When symptoms have relieved, can back off the miralax again and continue with the bid Amitiza for long term control. If ineffective at long term management can consider trial of Linzess. Routine follow-up in 3 months to reassess and make any needed changes based on symptoms.

## 2014-07-29 NOTE — Assessment & Plan Note (Addendum)
Hemorrhoids on last colonoscopy 2015, noted to be likely good candidate for banding. Patient has had some recent recurrence of paper hematochezia with temporary worsening in constipation. At this time she states she would like to hold off on the banding and see if it resolves in the next month or so. If she changes her mind she will call us to setup the appointment. Regardless, will plan for routine follow-up in 3 months to reassess and make any needed changes based on symptoms.

## 2014-07-31 ENCOUNTER — Ambulatory Visit (INDEPENDENT_AMBULATORY_CARE_PROVIDER_SITE_OTHER): Payer: Medicare Other | Admitting: Orthopedic Surgery

## 2014-07-31 VITALS — BP 128/91 | Ht 64.5 in | Wt 187.0 lb

## 2014-07-31 DIAGNOSIS — M5431 Sciatica, right side: Secondary | ICD-10-CM

## 2014-07-31 MED ORDER — GABAPENTIN 100 MG PO CAPS: 100.0000 mg | ORAL_CAPSULE | Freq: Three times a day (TID) | ORAL | Status: DC

## 2014-07-31 NOTE — Progress Notes (Signed)
Chief Complaint  Patient presents with  . Follow-up    2 week recheck on on back and leg, respond to medication.    Follow-up for sciatica we treated her with a prednisone Dosepak and gabapentin she says she's 95% pain-free  Review of systems bowel symptoms negative lower bladder symptoms negative  Social history she's getting married  Exam is benign Ambulation is normal vital signs are stable no tenderness in the back her range of motion is improved gross motor exam in the lower extremities show normal extension power at the knee normal hip flexion bilaterally.  No imaging today  Follow-up when necessary refilled gabapentin

## 2014-07-31 NOTE — Patient Instructions (Signed)
Activity normal

## 2014-08-09 NOTE — Progress Notes (Signed)
CC'ED TO PCP 

## 2014-09-24 ENCOUNTER — Telehealth: Payer: Self-pay | Admitting: Orthopedic Surgery

## 2014-09-24 ENCOUNTER — Other Ambulatory Visit: Payer: Self-pay | Admitting: Orthopedic Surgery

## 2014-09-24 MED ORDER — PREDNISONE (PAK) 5 MG PO TABS: ORAL_TABLET | ORAL | Status: DC

## 2014-09-24 NOTE — Telephone Encounter (Signed)
Yes i sent it

## 2014-09-24 NOTE — Telephone Encounter (Signed)
Routing to Dr Harrison 

## 2014-09-24 NOTE — Telephone Encounter (Signed)
Patient called to relay that her back pain symptoms have recurred; states she has been taking the Gabapentin as directed, but asking if she may get the "other medication" refilled - Prednisone 5mg ?  Pharmacy appears to be [facility]. Please review and advise.  Patient ph# [phone]

## 2014-09-25 DIAGNOSIS — M549 Dorsalgia, unspecified: Secondary | ICD-10-CM | POA: Diagnosis not present

## 2014-09-25 DIAGNOSIS — M159 Polyosteoarthritis, unspecified: Secondary | ICD-10-CM | POA: Diagnosis not present

## 2014-09-25 DIAGNOSIS — I10 Essential (primary) hypertension: Secondary | ICD-10-CM | POA: Diagnosis not present

## 2014-10-14 ENCOUNTER — Ambulatory Visit (INDEPENDENT_AMBULATORY_CARE_PROVIDER_SITE_OTHER): Payer: Medicare Other | Admitting: Nurse Practitioner

## 2014-10-14 ENCOUNTER — Encounter: Payer: Self-pay | Admitting: Nurse Practitioner

## 2014-10-14 VITALS — BP 159/81 | HR 104 | Temp 97.3°F | Ht 64.0 in | Wt 191.8 lb

## 2014-10-14 DIAGNOSIS — K625 Hemorrhage of anus and rectum: Secondary | ICD-10-CM | POA: Diagnosis not present

## 2014-10-14 DIAGNOSIS — K5909 Other constipation: Secondary | ICD-10-CM

## 2014-10-14 DIAGNOSIS — K649 Unspecified hemorrhoids: Secondary | ICD-10-CM | POA: Diagnosis not present

## 2014-10-14 MED ORDER — LUBIPROSTONE 24 MCG PO CAPS: 24.0000 ug | ORAL_CAPSULE | Freq: Two times a day (BID) | ORAL | Status: DC

## 2014-10-14 NOTE — Progress Notes (Signed)
Referring Provider: Avon GullyFanta, Tesfaye, MD Primary Care Physician:  Avon GullyFANTA,TESFAYE, MD Primary GI:  Dr. Jena Gaussourk  Chief Complaint  Patient presents with  . Constipation  . Hemorrhoids    HPI:   64 year old female presents for follow-up on hemorrhoids and constipation. At last visit she was on Amitiza and was doing well except in the previous week began having a temporary exacerbation of her constipation symptoms due to a prescription for Ultram for back pain. At that point we had her increase her MiraLAX to 17 g daily for the next week to try to get over the temporary worsening of symptoms. Recommended routine follow-up in 3 months.  Today she states she was changed to tramadol with Tylenol. Is still having constipation averaging a bowel movement every 3-4 days. Has been drinking more water. Is taking Amitiza 8 mcg, a natural stool softener (from the health food store). Her constipation is associated with abdominal pain which is relieved with a bowel movement. Continues to have pain and irritation in her rectal area from the hemorrhoids. Occasional toilet tissue hematochezia but has not had any in the past 2 weeks. She is interested in pursuing hemorrhoid banding. Deneis additional upper or lower GI symptoms.  Past Medical History  Diagnosis Date  . GERD (gastroesophageal reflux disease)   . Chronic constipation   . Hypercholesterolemia   . Hypertension   . Back pain     Past Surgical History  Procedure Laterality Date  . Colonoscopy   10/16/2006    NUR: 3 mm polyp ablated via cold biopsy from the splenic flexure.External hemorrhoids, hyperplastic polyp  . Esophagogastroduodenoscopy  06/14/2006    NUR: Normal esophagogastroduodenoscopy  . Cholecystectomy    . Abdominal hysterectomy    . Back surgery    . C4-c5 fusion    . Foot surgery      X 5  . Hand surgery      X 2  . Rotator cuff repair    . Colonoscopy  Feb 2011    Dr. Jena Gaussourk: internal hemorrhoids, otherwise normal.   .  Esophagogastroduodenoscopy  Feb 2011    Dr. Jena Gaussourk: inflamed-appearing esophagus with overlying distal esophageal erosions superimposed on noncritical Schatzki ring, s/p dilation and biopsy. Benign path  . Colonoscopy N/A 04/08/2014    RMR: Internal hemorrhoids; colonic diverticulosis( few  as described above) hematochezia likely secondary to hemorrhoids in the setting of constipation.    Current Outpatient Prescriptions  Medication Sig Dispense Refill  . gabapentin (NEURONTIN) 100 MG capsule Take 1 capsule (100 mg total) by mouth 3 (three) times daily. 30 capsule 0  . lansoprazole (PREVACID) 30 MG capsule Take 30 mg by mouth daily at 12 noon.    . loratadine (CLARITIN) 10 MG tablet Take 10 mg by mouth daily.    Marland Kitchen. lubiprostone (AMITIZA) 8 MCG capsule Take 1 capsule (8 mcg total) by mouth 2 (two) times daily with a meal. 60 capsule 3  . polyethylene glycol (MIRALAX / GLYCOLAX) packet Take 17 g by mouth every other day.    . simvastatin (ZOCOR) 20 MG tablet Take 20 mg by mouth daily.    . valsartan-hydrochlorothiazide (DIOVAN-HCT) 160-12.5 MG per tablet Take 1 tablet by mouth daily.    . predniSONE (STERAPRED UNI-PAK) 5 MG TABS tablet Use as directed (Patient not taking: Reported on 10/14/2014) 48 tablet 0   No current facility-administered medications for this visit.    Allergies as of 10/14/2014 - Review Complete 10/14/2014  Allergen Reaction Noted  . Codeine    .  Meperidine hcl      Family History  Problem Relation Age of Onset  . Colon cancer Brother     age 11, deceased 2 weeks later    History   Social History  . Marital Status: Divorced    Spouse Name: N/A  . Number of Children: N/A  . Years of Education: N/A   Social History Main Topics  . Smoking status: Never Smoker   . Smokeless tobacco: Not on file     Comment: Never smoked  . Alcohol Use: No  . Drug Use: No  . Sexual Activity: Not on file   Other Topics Concern  . None   Social History Narrative     Review of Systems: General: Negative for anorexia, weight loss, fever, chills, fatigue, weakness. Eyes: Negative for vision changes.  ENT: Negative for hoarseness, difficulty swallowing. CV: Negative for chest pain, angina, palpitations.  Respiratory: Negative for dyspnea at rest, cough, wheezing.  GI: See history of present illness. Derm: Negative for rash or itching.  Neuro: Negative for weakness, memory loss, confusion.  Psych: Negative for anxiety, depression.  Endo: Negative for unusual weight change.  Heme: Negative for bruising or bleeding. Allergy: Negative for rash or hives.   Physical Exam: BP 159/81 mmHg  Pulse 104  Temp(Src) 97.3 F (36.3 C)  Ht  (1.626 m)  Wt 191 lb 12.8 oz (87 kg)  BMI 32.91 kg/m2 General:   Alert and oriented. No distress noted. Pleasant and cooperative.  Head:  Normocephalic and atraumatic. Eyes:  Conjuctiva clear without scleral icterus. Mouth:  Oral mucosa pink and moist. Good dentition. No lesions. Neck:  Supple, without mass or thyromegaly. Lungs:  Clear to auscultation bilaterally. No wheezes, rales, or rhonchi. No distress.  Heart:  S1, S2 present without murmurs, rubs, or gallops. Regular rate and rhythm. Abdomen:  +BS, soft, non-tender and non-distended. No rebound or guarding. No HSM or masses noted. Msk:  Symmetrical without gross deformities. Normal posture. Extremities:  Without edema. Neurologic:  Alert and  oriented x4;  grossly normal neurologically. Skin:  Intact without significant lesions or rashes. Psych:  Alert and cooperative. Normal mood and affect.    10/14/2014 9:31 AM

## 2014-10-14 NOTE — Patient Instructions (Signed)
1. I sent a prescription to your pharmacy to increase her dose for Amitiza to 24 g. Take 1 pill twice a day with meals. 2. We will schedule you for the hemorrhoid banding procedure in office with Dr. Jena Gaussourk. 3. If any problems or worsening of her symptoms before your appointment for banding call our office.

## 2014-10-22 ENCOUNTER — Telehealth: Payer: Self-pay | Admitting: Internal Medicine

## 2014-10-22 ENCOUNTER — Other Ambulatory Visit: Payer: Self-pay | Admitting: Nurse Practitioner

## 2014-10-22 MED ORDER — HYDROCORTISONE 2.5 % RE CREA: 1.0000 "application " | TOPICAL_CREAM | Freq: Two times a day (BID) | RECTAL | Status: DC

## 2014-10-22 NOTE — Telephone Encounter (Signed)
Patient was seen on 4/26 and is scheduled a banding with RMR in late June. Patient is passing a lot of blood and wants to speak with the nurse and wants to have banding done sooner. There is nothing any sooner on RMR schedule. Please advise and call her at [phone]

## 2014-10-22 NOTE — Telephone Encounter (Signed)
Tried to call pt- LMOM 

## 2014-10-22 NOTE — Telephone Encounter (Signed)
Called pt- NA-LMOM with information.  

## 2014-10-22 NOTE — Telephone Encounter (Signed)
Sent in Anusol rectal cream to her pharmacy. Please notify the patient.

## 2014-10-22 NOTE — Telephone Encounter (Signed)
Spoke with the pt- she is taking her [place] and she is not sure yet how well it is working. She was straining this morning with a bm and noticed a lot of bright red blood with wiping. No pain, no burning, no itching.  She knows she has hemorrhoids and is on the schedule for a banding in June. She wants to know if we can send in a cream to help with the bleeding. [facility] uses [facility] [place].

## 2014-10-23 NOTE — Assessment & Plan Note (Signed)
Patient with persistent hemorrhoids with occasional toilet tissue hematochezia. Most recent colonoscopy in 2015 found internal hemorrhoids as likely source of hematochezia and noted to be a good candidate for [facility] banding. Patient has deferred this option for some time however given the progression of her hemorrhoid symptoms she is wanting tablet be seen for this at this time. We will schedule her for CRH banding visit with Dr. Rourk. 

## 2014-10-23 NOTE — Assessment & Plan Note (Signed)
Patient with persistent hemorrhoids with occasional toilet tissue hematochezia. Most recent colonoscopy in 2015 found internal hemorrhoids as likely source of hematochezia and noted to be a good candidate for [facility] banding. Patient has deferred this option for some time however given the progression of her hemorrhoid symptoms she is wanting tablet be seen for this at this time. We will schedule her for CRH banding visit with Dr. Jena Gaussourk.

## 2014-10-23 NOTE — Assessment & Plan Note (Signed)
Patient with persistent constipation. Currently on Amitiza 8 g twice a day. We'll try increasing this to 24 g dose twice a day for improved effectiveness. Symptoms can be reevaluated when patient returns for CR[facility] banding.

## 2014-10-27 ENCOUNTER — Ambulatory Visit: Payer: Medicare Other | Admitting: Nurse Practitioner

## 2014-10-29 NOTE — Progress Notes (Signed)
cc'ed to pcp °

## 2014-12-15 ENCOUNTER — Encounter: Payer: Self-pay | Admitting: Internal Medicine

## 2014-12-15 ENCOUNTER — Ambulatory Visit (INDEPENDENT_AMBULATORY_CARE_PROVIDER_SITE_OTHER): Payer: Medicare Other | Admitting: Internal Medicine

## 2014-12-15 VITALS — BP 141/83 | HR 79 | Temp 98.2°F | Ht 64.0 in | Wt 191.6 lb

## 2014-12-15 DIAGNOSIS — K648 Other hemorrhoids: Secondary | ICD-10-CM

## 2014-12-15 DIAGNOSIS — K5900 Constipation, unspecified: Secondary | ICD-10-CM

## 2014-12-15 DIAGNOSIS — K5909 Other constipation: Secondary | ICD-10-CM | POA: Diagnosis not present

## 2014-12-15 NOTE — Progress Notes (Signed)
Patient with multiple hemorrhoid issues and constipation. Amitiza 8 twice a day not for effective in producing a bowel movement. Has on the order of 2 BMs weekly. Amitiza increased to 24 twice a day caused cramping  - did not tolerate. No stool softer. She did not like taking MiraLax in the past.  She would like to have banding done today.  I stressed the importance of behavioral modification and successful management of constipation as part of the multipronged approach for good bowel function. I could not guarantee her 100% result related to hemorrhoid banding.  For now we will add Colace 100 mg twice daily to regimen. Stop Amitiza. Add Linzess 145 once daily. I told her our goal should be at least one bowel movement daily to every other day or minimal 3 bowel movements weekly.  [facility] banding procedure note:  The patient presents with symptomatic grade 3 hemorrhoids, unresponsive to maximal medical therapy, requesting rubber band ligation of her hemorrhoidal disease. All risks, benefits, and alternative forms of therapy were described and informed consent was obtained.  In the left lateral decubitus position, digital rectal exam revealed 2 grade 3 hemorrhoid tags; otherwise, negative.  The decision was made to band the right anterior internal hemorrhoid; the [facility] O'Regan System was used to perform band ligation without complication. Digital anorectal examination was then performed to assure proper positioning of the band; Band found to be in excellent position. Patient had pressure but no pinching or pain after deployment The patient was discharged home without pain or other issues. Dietary and behavioral recommendations were given.   Discharge instructions provided. Linzess 145 samples provided.   Colace 100 mg twice daily to be started. Office visit in 3 weeks.   No complications were encountered and the patient tolerated the procedure well.

## 2014-12-15 NOTE — Patient Instructions (Signed)
Avoid straining.  Limit toilet time to 2-5 minutes  Stop Amitiza  Begin Linzess 145 daily-samples provided  Begin Colace 100 mg twice daily to soften stool  Call with any interim problems  Schedule followup appointment in3 weeks from now

## 2015-01-06 ENCOUNTER — Encounter: Payer: Self-pay | Admitting: Internal Medicine

## 2015-01-06 ENCOUNTER — Ambulatory Visit (INDEPENDENT_AMBULATORY_CARE_PROVIDER_SITE_OTHER): Payer: Medicare Other | Admitting: Internal Medicine

## 2015-01-06 VITALS — BP 135/73 | HR 86 | Temp 98.3°F | Ht 64.0 in | Wt 189.0 lb

## 2015-01-06 DIAGNOSIS — K648 Other hemorrhoids: Secondary | ICD-10-CM | POA: Diagnosis not present

## 2015-01-06 NOTE — Patient Instructions (Signed)
Avoid straining  Continue Linzess daily  Continue Colace daily.  Limit toilet time to 2-3 minutes  Call with any interim problems  Schedule followup appointment in 3-4 weeks from now

## 2015-01-06 NOTE — Progress Notes (Signed)
CR[facility] banding procedure note:  The patient presents with symptomatic grade 2/3 hemorrhoids;  Status post banding of the right anterior hemorrhoid.pressure and bleeding have improved. Linzess and Colace have  facilitated bowel function significantly. All risks, benefits, and alternative forms of therapy were described and informed consent was obtained.  In the left lateral decubitus position, digital rectal exam revealed a small grade 2 hemorrhoid tag only.  The decision was made to band the left lateral internal hemorrhoid; [facility] O'Regan System was used to perform band ligation without complication. Digital anorectal examination was then performed to assure proper positioning of the band;  No pinching or pain after deployment. Follow-up digital rectal exam revealed band to be in excellent position;  The patient was discharged home without pain or other issues. Dietary and behavioral recommendations were given. Patient to continue Linzess and colace. The patient will return in 3-4 weeks for followup and possible additional banding as required.  No complications were encountered and the patient tolerated the procedure well.

## 2015-01-08 ENCOUNTER — Telehealth: Payer: Self-pay | Admitting: General Practice

## 2015-01-08 MED ORDER — LINACLOTIDE 145 MCG PO CAPS: 145.0000 ug | ORAL_CAPSULE | Freq: Every day | ORAL | Status: DC

## 2015-01-08 NOTE — Telephone Encounter (Signed)
Patient stated she was given samples of Linzess after her procedure in 11/2014 and she would like [facility] [place].  Routing to the refill box

## 2015-01-08 NOTE — Addendum Note (Signed)
Addended by: Nira Retort on: 01/08/2015 11:52 AM   Modules accepted: Orders

## 2015-01-08 NOTE — Telephone Encounter (Signed)
Done

## 2015-01-13 ENCOUNTER — Other Ambulatory Visit (HOSPITAL_COMMUNITY): Payer: Self-pay | Admitting: Internal Medicine

## 2015-01-13 DIAGNOSIS — E785 Hyperlipidemia, unspecified: Secondary | ICD-10-CM | POA: Diagnosis not present

## 2015-01-13 DIAGNOSIS — Z1231 Encounter for screening mammogram for malignant neoplasm of breast: Secondary | ICD-10-CM

## 2015-01-13 DIAGNOSIS — K219 Gastro-esophageal reflux disease without esophagitis: Secondary | ICD-10-CM | POA: Diagnosis not present

## 2015-01-13 DIAGNOSIS — I10 Essential (primary) hypertension: Secondary | ICD-10-CM | POA: Diagnosis not present

## 2015-01-13 DIAGNOSIS — J309 Allergic rhinitis, unspecified: Secondary | ICD-10-CM | POA: Diagnosis not present

## 2015-01-13 DIAGNOSIS — M159 Polyosteoarthritis, unspecified: Secondary | ICD-10-CM | POA: Diagnosis not present

## 2015-01-22 ENCOUNTER — Ambulatory Visit (HOSPITAL_COMMUNITY)
Admission: RE | Admit: 2015-01-22 | Discharge: 2015-01-22 | Disposition: A | Discharge status: Home / Self Care | Payer: Medicare Other | Source: Ambulatory Visit | Attending: Internal Medicine | Admitting: Internal Medicine

## 2015-01-22 ENCOUNTER — Ambulatory Visit (HOSPITAL_COMMUNITY): Payer: Medicare Other

## 2015-01-22 DIAGNOSIS — Z1231 Encounter for screening mammogram for malignant neoplasm of breast: Secondary | ICD-10-CM | POA: Insufficient documentation

## 2015-01-26 ENCOUNTER — Other Ambulatory Visit: Payer: Self-pay | Admitting: Orthopedic Surgery

## 2015-01-26 ENCOUNTER — Telehealth: Payer: Self-pay | Admitting: Orthopedic Surgery

## 2015-01-26 NOTE — Telephone Encounter (Signed)
Spoke w/patient regarding appointment for left knee pain, radiating downward toward heel; had spoken with patient approximately 3 weeks ago, which, at the time, Dr Romeo Apple was finishing up for the week, and was scheduled to be out of office for the next several days, so patient elected to not schedule at that time.  A call was also received this morning from [facility] radiology, per Darel Hong, stating that patient was told she can come there for Xrays; however, we had not advised this and have no orders for Xrays.  Patient was scheduled for the next available appointment to include Xrays here.  Patient aware.

## 2015-02-02 ENCOUNTER — Other Ambulatory Visit (HOSPITAL_COMMUNITY): Payer: Self-pay | Admitting: Internal Medicine

## 2015-02-02 ENCOUNTER — Ambulatory Visit (HOSPITAL_COMMUNITY)
Admission: RE | Admit: 2015-02-02 | Discharge: 2015-02-02 | Disposition: A | Discharge status: Home / Self Care | Payer: Medicare Other | Source: Ambulatory Visit | Attending: Internal Medicine | Admitting: Internal Medicine

## 2015-02-02 DIAGNOSIS — M25562 Pain in left knee: Secondary | ICD-10-CM | POA: Insufficient documentation

## 2015-02-02 DIAGNOSIS — R52 Pain, unspecified: Secondary | ICD-10-CM

## 2015-02-02 DIAGNOSIS — M1712 Unilateral primary osteoarthritis, left knee: Secondary | ICD-10-CM | POA: Diagnosis not present

## 2015-02-02 DIAGNOSIS — M79672 Pain in left foot: Secondary | ICD-10-CM | POA: Insufficient documentation

## 2015-02-02 DIAGNOSIS — S8992XA Unspecified injury of left lower leg, initial encounter: Secondary | ICD-10-CM | POA: Diagnosis not present

## 2015-02-02 DIAGNOSIS — S93602A Unspecified sprain of left foot, initial encounter: Secondary | ICD-10-CM | POA: Diagnosis not present

## 2015-02-10 ENCOUNTER — Ambulatory Visit (INDEPENDENT_AMBULATORY_CARE_PROVIDER_SITE_OTHER): Payer: Medicare Other | Admitting: Internal Medicine

## 2015-02-10 VITALS — BP 151/81 | HR 91 | Temp 97.8°F | Ht 65.0 in | Wt 189.8 lb

## 2015-02-10 DIAGNOSIS — K641 Second degree hemorrhoids: Secondary | ICD-10-CM

## 2015-02-10 DIAGNOSIS — K648 Other hemorrhoids: Secondary | ICD-10-CM

## 2015-02-10 DIAGNOSIS — K642 Third degree hemorrhoids: Secondary | ICD-10-CM

## 2015-02-10 NOTE — Patient Instructions (Signed)
Avoid straining.  Benefiber 2 teaspoons twice daily  Continue taking Linzess and Colace  Limit toilet time to 2-3 minutes  Call with any interim problems  Schedule followup appointment in 2-3 weeks from now

## 2015-02-10 NOTE — Progress Notes (Signed)
CR[facility] banding procedure note:  The patient presents with symptomatic grade 2/3 hemorrhoids - status post banding of the left lateral and right anterior hemorrhoid column. She states, globally, her hemorrhoid symptoms have improved. She catches herself sitting too long to come on from time to time and sometimes pass a little blood. But she feels definitely banding is helped; She is requesting rubber band ligation today. All risks, benefits, and alternative forms of therapy were described and informed consent was obtained.  In the left lateral decubitus position, a digital rectal exam revealed a small grade 2 hemorrhoid tag only. A pea-sized amount of 0.125% nitroglycerin and Xylocaine gel placed in the anorectum digitally.  The decision was made to band the right posterior internal hemorrhoid; the [facility] O'Regan System was used to perform band ligation without complication. Digital anorectal examination was then performed to assure proper positioning of the band; no pain or pinching after deployment. Followup with DR the revealed band to be in adequate position.. The patient was discharged home without pain or other issues. Dietary and behavioral recommendations were given. The patient will return in 3 months for followup..  No complications were encountered and the patient tolerated the procedure well.

## 2015-02-16 ENCOUNTER — Encounter: Payer: Self-pay | Admitting: Orthopedic Surgery

## 2015-02-16 ENCOUNTER — Ambulatory Visit (INDEPENDENT_AMBULATORY_CARE_PROVIDER_SITE_OTHER): Payer: Medicare Other | Admitting: Orthopedic Surgery

## 2015-02-16 VITALS — BP 150/86 | Ht 65.0 in | Wt 189.8 lb

## 2015-02-16 DIAGNOSIS — S83242A Other tear of medial meniscus, current injury, left knee, initial encounter: Secondary | ICD-10-CM

## 2015-02-16 MED ORDER — GABAPENTIN 100 MG PO CAPS: 100.0000 mg | ORAL_CAPSULE | Freq: Three times a day (TID) | ORAL | Status: DC

## 2015-02-16 NOTE — Patient Instructions (Signed)
We will schedule MRI and call you with appt- follow up at office

## 2015-02-16 NOTE — Progress Notes (Signed)
Chief Complaint  Patient presents with  . Knee Pain    left knee pain with pain down to foot, no known injury    The patient gives the following history. This is a new problem of an established patient  The patient has had 2 months history of pain in her foot on the plantar aspect noted when she gets out of bed or stands from a seated position    She also has pain in her left knee which started 2 months ago with no history of trauma. She has pain swelling stiffness catching locking giving way. Pain is become constant  She was on naproxen and ibuprofen for the last 8 weeks with no improvement she did take some gabapentin which seemed to give her some relief of her heel pain  She presents for evaluation and treatment  Review of systems burning pain in her legs with tingling related to her back problems chronic back pain stiff joints limb pain joint pain denies fever chills does complain of some ankle leg swelling  Past Surgical History  Procedure Laterality Date  . Colonoscopy   10/16/2006    NUR: 3 mm polyp ablated via cold biopsy from the splenic flexure.External hemorrhoids, hyperplastic polyp  . Esophagogastroduodenoscopy  06/14/2006    NUR: Normal esophagogastroduodenoscopy  . Cholecystectomy    . Abdominal hysterectomy    . Back surgery    . C4-c5 fusion    . Foot surgery      X 5  . Hand surgery      X 2  . Rotator cuff repair    . Colonoscopy  Feb 2011    Dr. Jena Gauss: internal hemorrhoids, otherwise normal.   . Esophagogastroduodenoscopy  Feb 2011    Dr. Jena Gauss: inflamed-appearing esophagus with overlying distal esophageal erosions superimposed on noncritical Schatzki ring, s/p dilation and biopsy. Benign path  . Colonoscopy N/A 04/08/2014    RMR: Internal hemorrhoids; colonic diverticulosis( few  as described above) hematochezia likely secondary to hemorrhoids in the setting of constipation.  . Hemorrhoid banding  2016    Dr.Rourk   Past Medical History  Diagnosis  Date  . GERD (gastroesophageal reflux disease)   . Chronic constipation   . Hypercholesterolemia   . Hypertension   . Back pain   . Hemorrhoids    Vital signs BP 150/86 mmHg  Ht  (1.651 m)  Wt 189 lb 12.8 oz (86.093 kg)  BMI 31.58 kg/m2   General appearance her appearance is normal she is well-groomed  Orientation  normal orientation person place and time  Mood and affect  pleasant  Gait and station  normal  Cardiovascular normal pulses 2+ dorsalis pedis pulse feet  Lymph system not tested  Sensation normal both legs  Deep tendon and pathologic reflexes exam  normal reflexes normal Babinski  Coordination and balance normal coordination balance  Extremities/Skin (4) right knee has mild crepitance mild tenderness but range of motion is just about normal  Left knee medial joint line tenderness positive McMurray's decreased flexion ligament stable mild effusion  X-rays show minimal degenerative changes  Encounter Diagnosis  Name Primary?  . Medial meniscus tear, left, initial encounter Yes    Plan MRI left knee probably has meniscal tear recommend surgery after confirmation

## 2015-03-03 ENCOUNTER — Ambulatory Visit: Payer: Medicare Other | Admitting: Internal Medicine

## 2015-03-03 ENCOUNTER — Ambulatory Visit (HOSPITAL_COMMUNITY)
Admission: RE | Admit: 2015-03-03 | Discharge: 2015-03-03 | Disposition: A | Discharge status: Home / Self Care | Payer: Medicare Other | Source: Ambulatory Visit | Attending: Orthopedic Surgery | Admitting: Orthopedic Surgery

## 2015-03-03 DIAGNOSIS — R531 Weakness: Secondary | ICD-10-CM | POA: Diagnosis not present

## 2015-03-03 DIAGNOSIS — S83242A Other tear of medial meniscus, current injury, left knee, initial encounter: Secondary | ICD-10-CM

## 2015-03-03 DIAGNOSIS — M25562 Pain in left knee: Secondary | ICD-10-CM | POA: Diagnosis not present

## 2015-03-03 DIAGNOSIS — M25462 Effusion, left knee: Secondary | ICD-10-CM | POA: Diagnosis not present

## 2015-03-03 DIAGNOSIS — M7122 Synovial cyst of popliteal space [Baker], left knee: Secondary | ICD-10-CM | POA: Insufficient documentation

## 2015-03-03 DIAGNOSIS — M23252 Derangement of posterior horn of lateral meniscus due to old tear or injury, left knee: Secondary | ICD-10-CM | POA: Diagnosis not present

## 2015-03-03 DIAGNOSIS — M94262 Chondromalacia, left knee: Secondary | ICD-10-CM | POA: Insufficient documentation

## 2015-03-05 ENCOUNTER — Telehealth: Payer: Self-pay | Admitting: Orthopedic Surgery

## 2015-03-05 NOTE — Telephone Encounter (Signed)
Left message with results

## 2015-03-10 ENCOUNTER — Ambulatory Visit: Payer: Medicare Other | Admitting: Internal Medicine

## 2015-03-10 ENCOUNTER — Ambulatory Visit: Payer: Medicare Other | Admitting: Orthopedic Surgery

## 2015-03-10 ENCOUNTER — Telehealth: Payer: Self-pay | Admitting: Orthopedic Surgery

## 2015-03-10 ENCOUNTER — Telehealth: Payer: Self-pay

## 2015-03-10 NOTE — Telephone Encounter (Signed)
Pt had an office visit scheduled today with [place] for a follow up from her bandings. I spoke with the pt, she said she is doing much better now. She said her bm's were doing good most of the time. She is still taking her medications as prescribed. She said the thought about calling yesterday and cancelling her appt because she was doing so well. She doesn't think she needs to be seen today. I informed her that if she was doing ok I could talk with Dr.Rourk and find out when he would like for her to return. She said that was fine with her. Misty Stanley cancelled pts appt for today.  Pt will also be having knee surgery in the near future.

## 2015-03-10 NOTE — Telephone Encounter (Signed)
thanks

## 2015-03-10 NOTE — Telephone Encounter (Signed)
Do you want pt to have a follow up appt?

## 2015-03-10 NOTE — Telephone Encounter (Signed)
Patient called to request to cancel her appointment for today, 03/10/15, regarding review of MRI results; states she's decided that since she is aware of results per Dr Romeo Apple and per nurse that she would like to go ahead and schedule the surgery for sometime in October.  Ph#'s [id number]) and [id number]).

## 2015-03-11 ENCOUNTER — Other Ambulatory Visit: Payer: Self-pay | Admitting: *Deleted

## 2015-03-11 NOTE — Telephone Encounter (Signed)
Patient is wishing to have surgery Oct 7th

## 2015-03-11 NOTE — Telephone Encounter (Signed)
ORDERS ENTERED

## 2015-03-16 ENCOUNTER — Telehealth: Payer: Self-pay | Admitting: Orthopedic Surgery

## 2015-03-16 NOTE — Telephone Encounter (Signed)
Regarding out-patient surgery scheduled at [facility] on 03/27/15, per patient's insurer, Medicare, guidelines - no pre-authorization is required.

## 2015-03-23 NOTE — Patient Instructions (Signed)
Tammy Kramer  03/23/2015     @   Your procedure is scheduled on  03/27/2015   Report to [facility] at  615  A.M.  Call this number if you have problems the morning of surgery:  [phone]   Remember:  Do not eat food or drink liquids after midnight.  Take these medicines the morning of surgery with A SIP OF WATER  Neurontin, prevacid, claritin, diovan   Do not wear jewelry, make-up or nail polish.  Do not wear lotions, powders, or perfumes.  You may wear deodorant.  Do not shave 48 hours prior to surgery.  Men may shave face and neck.  Do not bring valuables to the hospital.  [facility] : [facility] is not responsible for any belongings or valuables.  Contacts, dentures or bridgework may not be worn into surgery.  Leave your suitcase in the car.  After surgery it may be brought to your room.  For patients admitted to the hospital, discharge time will be determined by your treatment team.  Patients discharged the day of surgery will not be allowed to drive home.   Name and phone number of your driver:   family Special instructions:  none  Please read over the following fact sheets that you were given. Pain Booklet, Coughing and Deep Breathing, Surgical Site Infection Prevention, Anesthesia Post-op Instructions and Care and Recovery After Surgery        [place] - Preparing for Surgery  Before surgery, you can play an important role.  Because skin is not sterile, your skin needs to be as free of germs as possible.  You can reduce the number of germs on you skin by washing with CHG (chlorahexidine gluconate) soap before surgery.  CHG is an antiseptic cleaner which kills germs and bonds with the skin to continue killing germs even after washing.  Please DO NOT use if you have an allergy to CHG or antibacterial soaps.  If your skin becomes reddened/irritated stop using the CHG and inform your nurse when you arrive at Short Stay.  Do not shave (including  legs and underarms) for at least 48 hours prior to the first CHG shower.  You may shave your face.  Please follow these instructions carefully:   1.  Shower with CHG Soap the night before surgery and the                                morning of Surgery.  2.  If you choose to wash your hair, wash your hair first as usual with your       normal shampoo.  3.  After you shampoo, rinse your hair and body thoroughly to remove the                      Shampoo.  4.  Use CHG as you would any other liquid soap.  You can apply chg directly       to the skin and wash gently with scrungie or a clean washcloth.  5.  Apply the CHG Soap to your body ONLY FROM THE NECK DOWN.        Do not use on open wounds or open sores.  Avoid contact with your eyes,       ears, mouth and genitals (private parts).  Wash genitals (private parts)       with your  normal soap.  6.  Wash thoroughly, paying special attention to the area where your surgery        will be performed.  7.  Thoroughly rinse your body with warm water from the neck down.  8.  DO NOT shower/wash with your normal soap after using and rinsing off       the CHG Soap.  9.  Pat yourself dry with a clean towel.            10.  Wear clean pajamas.            11.  Place clean sheets on your bed the night of your first shower and do not        sleep with pets.  Day of Surgery  Do not apply any lotions/deoderants the morning of surgery.  Please wear clean clothes to the hospital/surgery center.  Arthroscopic Procedure, Knee An arthroscopic procedure can find what is wrong with your knee. PROCEDURE Arthroscopy is a surgical technique that allows your orthopedic surgeon to diagnose and treat your knee injury with accuracy. They will look into your knee through a small instrument. This is almost like a small (pencil sized) telescope. Because arthroscopy affects your knee less than open knee surgery, you can anticipate a more rapid recovery. Taking an active role by  following your caregiver's instructions will help with rapid and complete recovery. Use crutches, rest, elevation, ice, and knee exercises as instructed. The length of recovery depends on various factors including type of injury, age, physical condition, medical conditions, and your rehabilitation. Your knee is the joint between the large bones (femur and tibia) in your leg. Cartilage covers these bone ends which are smooth and slippery and allow your knee to bend and move smoothly. Two menisci, thick, semi-lunar shaped pads of cartilage which form a rim inside the joint, help absorb shock and stabilize your knee. Ligaments bind the bones together and support your knee joint. Muscles move the joint, help support your knee, and take stress off the joint itself. Because of this all programs and physical therapy to rehabilitate an injured or repaired knee require rebuilding and strengthening your muscles. AFTER THE PROCEDURE  After the procedure, you will be moved to a recovery area until most of the effects of the medication have worn off. Your caregiver will discuss the test results with you.  Only take over-the-counter or prescription medicines for pain, discomfort, or fever as directed by your caregiver. SEEK MEDICAL CARE IF:   You have increased bleeding from your wounds.  You see redness, swelling, or have increasing pain in your wounds.  You have pus coming from your wound.  You have an oral temperature above 102 F (38.9 C).  You notice a bad smell coming from the wound or dressing.  You have severe pain with any motion of your knee. SEEK IMMEDIATE MEDICAL CARE IF:   You develop a rash.  You have difficulty breathing.  You have any allergic problems. Document Released: 06/03/2000 Document Revised: 08/29/2011 Document Reviewed: 12/26/2007 [facility] Patient Information 2015 [place], [place]. This information is not intended to replace advice given to you by your health care provider.  Make sure you discuss any questions you have with your health care provider. PATIENT INSTRUCTIONS POST-ANESTHESIA  IMMEDIATELY FOLLOWING SURGERY:  Do not drive or operate machinery for the first twenty four hours after surgery.  Do not make any important decisions for twenty four hours after surgery or while taking narcotic pain medications or sedatives.  If you develop intractable nausea and vomiting or a severe headache please notify your doctor immediately.  FOLLOW-UP:  Please make an appointment with your surgeon as instructed. You do not need to follow up with anesthesia unless specifically instructed to do so.  WOUND CARE INSTRUCTIONS (if applicable):  Keep a dry clean dressing on the anesthesia/puncture wound site if there is drainage.  Once the wound has quit draining you may leave it open to air.  Generally you should leave the bandage intact for twenty four hours unless there is drainage.  If the epidural site drains for more than 36-48 hours please call the anesthesia department.  QUESTIONS?:  Please feel free to call your physician or the hospital operator if you have any questions, and they will be happy to assist you.

## 2015-03-24 ENCOUNTER — Encounter (HOSPITAL_COMMUNITY): Payer: Self-pay

## 2015-03-24 ENCOUNTER — Encounter (HOSPITAL_COMMUNITY)
Admission: RE | Admit: 2015-03-24 | Discharge: 2015-03-24 | Disposition: A | Discharge status: Home / Self Care | Payer: Medicare Other | Source: Ambulatory Visit | Attending: Orthopedic Surgery | Admitting: Orthopedic Surgery

## 2015-03-24 ENCOUNTER — Other Ambulatory Visit: Payer: Self-pay

## 2015-03-24 DIAGNOSIS — Z01810 Encounter for preprocedural cardiovascular examination: Secondary | ICD-10-CM | POA: Diagnosis not present

## 2015-03-24 DIAGNOSIS — M94262 Chondromalacia, left knee: Secondary | ICD-10-CM | POA: Diagnosis not present

## 2015-03-24 DIAGNOSIS — X58XXXA Exposure to other specified factors, initial encounter: Secondary | ICD-10-CM | POA: Diagnosis not present

## 2015-03-24 DIAGNOSIS — Z79899 Other long term (current) drug therapy: Secondary | ICD-10-CM | POA: Diagnosis not present

## 2015-03-24 DIAGNOSIS — S83282A Other tear of lateral meniscus, current injury, left knee, initial encounter: Secondary | ICD-10-CM | POA: Diagnosis not present

## 2015-03-24 DIAGNOSIS — K219 Gastro-esophageal reflux disease without esophagitis: Secondary | ICD-10-CM | POA: Diagnosis not present

## 2015-03-24 DIAGNOSIS — I10 Essential (primary) hypertension: Secondary | ICD-10-CM | POA: Diagnosis not present

## 2015-03-24 DIAGNOSIS — M1990 Unspecified osteoarthritis, unspecified site: Secondary | ICD-10-CM | POA: Diagnosis not present

## 2015-03-24 HISTORY — DX: Other seasonal allergic rhinitis: J30.2

## 2015-03-24 HISTORY — DX: Unspecified osteoarthritis, unspecified site: M19.90

## 2015-03-24 LAB — CBC WITH DIFFERENTIAL/PLATELET
BASOS PCT: 1 %
Basophils Absolute: 0 10*3/uL (ref 0.0–0.1)
Eosinophils Absolute: 0.1 10*3/uL (ref 0.0–0.7)
Eosinophils Relative: 2 %
HEMATOCRIT: 40.9 % (ref 36.0–46.0)
Hemoglobin: 13.7 g/dL (ref 12.0–15.0)
Lymphocytes Relative: 21 %
Lymphs Abs: 1.8 10*3/uL (ref 0.7–4.0)
MCH: 30.7 pg (ref 26.0–34.0)
MCHC: 33.5 g/dL (ref 30.0–36.0)
MCV: 91.7 fL (ref 78.0–100.0)
MONO ABS: 0.6 10*3/uL (ref 0.1–1.0)
MONOS PCT: 7 %
NEUTROS ABS: 6 10*3/uL (ref 1.7–7.7)
Neutrophils Relative %: 69 %
Platelets: 312 10*3/uL (ref 150–400)
RBC: 4.46 MIL/uL (ref 3.87–5.11)
RDW: 12.7 % (ref 11.5–15.5)
WBC: 8.6 10*3/uL (ref 4.0–10.5)

## 2015-03-24 LAB — BASIC METABOLIC PANEL
Anion gap: 9 (ref 5–15)
BUN: 23 mg/dL — ABNORMAL HIGH (ref 6–20)
CALCIUM: 9.2 mg/dL (ref 8.9–10.3)
CO2: 24 mmol/L (ref 22–32)
CREATININE: 1.13 mg/dL — AB (ref 0.44–1.00)
Chloride: 106 mmol/L (ref 101–111)
GFR calc non Af Amer: 50 mL/min — ABNORMAL LOW (ref >60)
GFR, EST AFRICAN AMERICAN: 58 mL/min — AB (ref >60)
GLUCOSE: 131 mg/dL — AB (ref 65–99)
Potassium: 3.4 mmol/L — ABNORMAL LOW (ref 3.5–5.1)
Sodium: 139 mmol/L (ref 135–145)

## 2015-03-24 NOTE — Pre-Procedure Instructions (Signed)
Patient given information to sign up for my chart at home. 

## 2015-03-26 NOTE — H&P (Signed)
Chief Complaint   Patient presents with   .  Knee Pain       left knee pain with pain down to foot, no known injury     The patient gives the following history. This is a new problem of an established patient  The patient has had 2 months history of pain in her foot on the plantar aspect noted when she gets out of bed or stands from a seated position    She also has pain in her left knee which started 2 months ago with no history of trauma. She has pain swelling stiffness catching locking giving way. Pain is become constant  She was on naproxen and ibuprofen for the last 8 weeks with no improvement she did take some gabapentin which seemed to give her some relief of her heel pain  She presents for evaluation and treatment  Review of systems burning pain in her legs with tingling related to her back problems chronic back pain stiff joints limb pain joint pain denies fever chills does complain of some ankle leg swelling    Past Surgical History   Procedure  Laterality  Date   .  Colonoscopy     10/16/2006       NUR: 3 mm polyp ablated via cold biopsy from the splenic flexure.External hemorrhoids, hyperplastic polyp   .  Esophagogastroduodenoscopy    06/14/2006       NUR: Normal esophagogastroduodenoscopy   .  Cholecystectomy       .  Abdominal hysterectomy       .  Back surgery       .  C4-c5 fusion       .  Foot surgery           X 5   .  Hand surgery           X 2   .  Rotator cuff repair       .  Colonoscopy    Feb 2011       Dr. Jena Gauss: internal hemorrhoids, otherwise normal.    .  Esophagogastroduodenoscopy    Feb 2011       Dr. Jena Gauss: inflamed-appearing esophagus with overlying distal esophageal erosions superimposed on noncritical Schatzki ring, s/p dilation and biopsy. Benign path   .  Colonoscopy  N/A  04/08/2014       RMR: Internal hemorrhoids; colonic diverticulosis( few  as described above) hematochezia likely secondary to hemorrhoids in the setting of constipation.    .  Hemorrhoid banding    2016       Dr.Rourk    Past Medical History   Diagnosis  Date   .  GERD (gastroesophageal reflux disease)     .  Chronic constipation     .  Hypercholesterolemia     .  Hypertension     .  Back pain     .  Hemorrhoids      Vital signs BP 150/86 mmHg  Ht  (1.651 m)  Wt 189 lb 12.8 oz (86.093 kg)  BMI 31.58 kg/m2   General appearance her appearance is normal she is well-groomed  Orientation  normal orientation person place and time  Mood and affect  pleasant  Gait and station  normal  Cardiovascular normal pulses 2+ dorsalis pedis pulse feet  Lymph system not tested  Sensation normal both legs  Deep tendon and pathologic reflexes exam  normal reflexes normal Babinski  Coordination and balance normal coordination balance  Extremities/Skin (4) right knee has mild crepitance mild tenderness but range of motion is just about normal  Left knee medial joint line tenderness positive McMurray's decreased flexion ligament stable mild effusion  FINDINGS: There is mild unicompartmental degenerative joint disease of the left knee involving the medial compartment with slight loss of joint space and mild sclerosis. No fracture is seen. The lateral view is suboptimal and joint effusion cannot be assessed.   IMPRESSION: 1. No acute abnormality. 2. Mild degenerative joint disease of the medial compartment.     Electronically Signed   By: Dwyane Dee M.D.   On: 02/02/2015 14:07   IMPRESSION: 1. Lateral meniscus tear. 2. Intact ligamentous structures and no acute bony findings. 3. Moderate tricompartmental degenerative chondrosis/chondromalacia 4. Small joint effusion and small Baker's cyst.     Electronically Signed   By: Rudie Meyer M.D.   On: 03/03/2015 10:59   Diagnosis torn lateral meniscus and osteoarthritis of the left knee  Plan for arthroscopy left knee and partial lateral meniscectomy

## 2015-03-27 ENCOUNTER — Encounter (HOSPITAL_COMMUNITY): Payer: Self-pay | Admitting: *Deleted

## 2015-03-27 ENCOUNTER — Encounter (HOSPITAL_COMMUNITY)
Admission: RE | Disposition: A | Discharge status: Home / Self Care | Payer: Self-pay | Source: Ambulatory Visit | Attending: Orthopedic Surgery

## 2015-03-27 ENCOUNTER — Ambulatory Visit (HOSPITAL_COMMUNITY)
Admission: RE | Admit: 2015-03-27 | Discharge: 2015-03-27 | Disposition: A | Discharge status: Home / Self Care | Payer: Medicare Other | Source: Ambulatory Visit | Attending: Orthopedic Surgery | Admitting: Orthopedic Surgery

## 2015-03-27 ENCOUNTER — Ambulatory Visit (HOSPITAL_COMMUNITY): Payer: Medicare Other | Admitting: Anesthesiology

## 2015-03-27 DIAGNOSIS — Z79899 Other long term (current) drug therapy: Secondary | ICD-10-CM | POA: Insufficient documentation

## 2015-03-27 DIAGNOSIS — S83282A Other tear of lateral meniscus, current injury, left knee, initial encounter: Secondary | ICD-10-CM | POA: Insufficient documentation

## 2015-03-27 DIAGNOSIS — Z01810 Encounter for preprocedural cardiovascular examination: Secondary | ICD-10-CM | POA: Diagnosis not present

## 2015-03-27 DIAGNOSIS — S83289A Other tear of lateral meniscus, current injury, unspecified knee, initial encounter: Secondary | ICD-10-CM | POA: Insufficient documentation

## 2015-03-27 DIAGNOSIS — I10 Essential (primary) hypertension: Secondary | ICD-10-CM | POA: Diagnosis not present

## 2015-03-27 DIAGNOSIS — M94262 Chondromalacia, left knee: Secondary | ICD-10-CM | POA: Diagnosis not present

## 2015-03-27 DIAGNOSIS — K219 Gastro-esophageal reflux disease without esophagitis: Secondary | ICD-10-CM | POA: Insufficient documentation

## 2015-03-27 DIAGNOSIS — M1990 Unspecified osteoarthritis, unspecified site: Secondary | ICD-10-CM | POA: Insufficient documentation

## 2015-03-27 DIAGNOSIS — X58XXXA Exposure to other specified factors, initial encounter: Secondary | ICD-10-CM | POA: Insufficient documentation

## 2015-03-27 HISTORY — PX: KNEE ARTHROSCOPY WITH LATERAL MENISECTOMY: SHX6193

## 2015-03-27 SURGERY — ARTHROSCOPY, KNEE, WITH LATERAL MENISCECTOMY
Anesthesia: General | Site: Knee | Laterality: Left

## 2015-03-27 MED ORDER — ONDANSETRON HCL 4 MG/2ML IJ SOLN
4.0000 mg | Freq: Once | INTRAMUSCULAR | Status: AC
  Administered 2015-03-27: 4 mg via INTRAVENOUS
  Filled 2015-03-27: qty 2

## 2015-03-27 MED ORDER — BUPIVACAINE-EPINEPHRINE (PF) 0.5% -1:200000 IJ SOLN
INTRAMUSCULAR | Status: DC | PRN
  Administered 2015-03-27: 60 mL

## 2015-03-27 MED ORDER — LACTATED RINGERS IV SOLN
INTRAVENOUS | Status: DC
  Administered 2015-03-27: 07:00:00 via INTRAVENOUS
  Administered 2015-03-27: 1000 mL via INTRAVENOUS

## 2015-03-27 MED ORDER — CEFAZOLIN SODIUM-DEXTROSE 2-3 GM-% IV SOLR
2.0000 g | INTRAVENOUS | Status: AC
  Administered 2015-03-27: 2 g via INTRAVENOUS

## 2015-03-27 MED ORDER — HYDROCODONE-ACETAMINOPHEN 5-325 MG PO TABS: 1.0000 | ORAL_TABLET | ORAL | Status: DC | PRN

## 2015-03-27 MED ORDER — PROPOFOL 10 MG/ML IV BOLUS
INTRAVENOUS | Status: AC
  Filled 2015-03-27: qty 20

## 2015-03-27 MED ORDER — EPINEPHRINE HCL 1 MG/ML IJ SOLN
INTRAMUSCULAR | Status: AC
  Filled 2015-03-27: qty 5

## 2015-03-27 MED ORDER — MIDAZOLAM HCL 2 MG/2ML IJ SOLN
INTRAMUSCULAR | Status: AC
  Filled 2015-03-27: qty 4

## 2015-03-27 MED ORDER — FENTANYL CITRATE (PF) 100 MCG/2ML IJ SOLN
INTRAMUSCULAR | Status: DC | PRN
  Administered 2015-03-27 (×2): 50 ug via INTRAVENOUS

## 2015-03-27 MED ORDER — FENTANYL CITRATE (PF) 100 MCG/2ML IJ SOLN
25.0000 ug | INTRAMUSCULAR | Status: DC | PRN
  Administered 2015-03-27: 25 ug via INTRAVENOUS
  Filled 2015-03-27: qty 2

## 2015-03-27 MED ORDER — MIDAZOLAM HCL 5 MG/5ML IJ SOLN
INTRAMUSCULAR | Status: DC | PRN
  Administered 2015-03-27: 1 mg via INTRAVENOUS

## 2015-03-27 MED ORDER — ONDANSETRON HCL 4 MG/2ML IJ SOLN
4.0000 mg | Freq: Once | INTRAMUSCULAR | Status: AC
Start: 2015-03-27 — End: 2015-03-27
  Administered 2015-03-27: 4 mg via INTRAVENOUS

## 2015-03-27 MED ORDER — KETOROLAC TROMETHAMINE 30 MG/ML IJ SOLN
30.0000 mg | Freq: Once | INTRAMUSCULAR | Status: AC
  Administered 2015-03-27: 30 mg via INTRAVENOUS
  Filled 2015-03-27: qty 1

## 2015-03-27 MED ORDER — PROPOFOL 10 MG/ML IV BOLUS
INTRAVENOUS | Status: DC | PRN
  Administered 2015-03-27: 150 mg via INTRAVENOUS

## 2015-03-27 MED ORDER — FENTANYL CITRATE (PF) 100 MCG/2ML IJ SOLN
INTRAMUSCULAR | Status: AC
  Filled 2015-03-27: qty 2

## 2015-03-27 MED ORDER — LIDOCAINE HCL 1 % IJ SOLN
INTRAMUSCULAR | Status: DC | PRN
  Administered 2015-03-27: 30 mg via INTRADERMAL

## 2015-03-27 MED ORDER — FENTANYL CITRATE (PF) 100 MCG/2ML IJ SOLN
INTRAMUSCULAR | Status: AC
  Filled 2015-03-27: qty 4

## 2015-03-27 MED ORDER — FENTANYL CITRATE (PF) 100 MCG/2ML IJ SOLN
25.0000 ug | INTRAMUSCULAR | Status: AC
  Administered 2015-03-27 (×2): 25 ug via INTRAVENOUS

## 2015-03-27 MED ORDER — ONDANSETRON HCL 4 MG/2ML IJ SOLN
INTRAMUSCULAR | Status: AC
  Filled 2015-03-27: qty 2

## 2015-03-27 MED ORDER — ONDANSETRON HCL 4 MG/2ML IJ SOLN: 4.0000 mg | Freq: Once | INTRAMUSCULAR | Status: DC | PRN

## 2015-03-27 MED ORDER — CEFAZOLIN SODIUM-DEXTROSE 2-3 GM-% IV SOLR
INTRAVENOUS | Status: AC
  Filled 2015-03-27: qty 50

## 2015-03-27 MED ORDER — MIDAZOLAM HCL 2 MG/2ML IJ SOLN
INTRAMUSCULAR | Status: AC
  Filled 2015-03-27: qty 2

## 2015-03-27 MED ORDER — DEXAMETHASONE SODIUM PHOSPHATE 4 MG/ML IJ SOLN
4.0000 mg | Freq: Once | INTRAMUSCULAR | Status: AC
  Administered 2015-03-27: 4 mg via INTRAVENOUS

## 2015-03-27 MED ORDER — CHLORHEXIDINE GLUCONATE 4 % EX LIQD: 60.0000 mL | Freq: Once | CUTANEOUS | Status: DC

## 2015-03-27 MED ORDER — SUCCINYLCHOLINE CHLORIDE 20 MG/ML IJ SOLN: INTRAMUSCULAR | Status: DC | PRN

## 2015-03-27 MED ORDER — MIDAZOLAM HCL 2 MG/2ML IJ SOLN
1.0000 mg | INTRAMUSCULAR | Status: DC | PRN
  Administered 2015-03-27: 2 mg via INTRAVENOUS
  Administered 2015-03-27: 1 mg via INTRAVENOUS
  Filled 2015-03-27: qty 2

## 2015-03-27 MED ORDER — LIDOCAINE HCL (PF) 1 % IJ SOLN
INTRAMUSCULAR | Status: AC
  Filled 2015-03-27: qty 5

## 2015-03-27 MED ORDER — PROMETHAZINE HCL 12.5 MG PO TABS: 12.5000 mg | ORAL_TABLET | Freq: Four times a day (QID) | ORAL | Status: DC | PRN

## 2015-03-27 MED ORDER — HYDROCODONE-ACETAMINOPHEN 5-325 MG PO TABS
1.0000 | ORAL_TABLET | Freq: Once | ORAL | Status: AC
Start: 2015-03-27 — End: 2015-03-27
  Administered 2015-03-27: 1 via ORAL
  Filled 2015-03-27: qty 1

## 2015-03-27 MED ORDER — DEXAMETHASONE SODIUM PHOSPHATE 4 MG/ML IJ SOLN
INTRAMUSCULAR | Status: AC
  Filled 2015-03-27: qty 1

## 2015-03-27 MED ORDER — BUPIVACAINE-EPINEPHRINE (PF) 0.5% -1:200000 IJ SOLN
INTRAMUSCULAR | Status: AC
  Filled 2015-03-27: qty 60

## 2015-03-27 MED ORDER — SODIUM CHLORIDE 0.9 % IR SOLN
Status: DC | PRN
  Administered 2015-03-27 (×2): 3000 mL

## 2015-03-27 SURGICAL SUPPLY — 60 items
ARTHROWAND PARAGON T2 (SURGICAL WAND)
BAG HAMPER (MISCELLANEOUS) ×3 IMPLANT
BANDAGE ELASTIC 6 VELCRO NS (GAUZE/BANDAGES/DRESSINGS) ×3 IMPLANT
BIT DRILL 2.0MX128MM (BIT) ×3 IMPLANT
BLADE 11 SAFETY STRL DISP (BLADE) ×3 IMPLANT
BLADE AGGRESSIVE PLUS 4.0 (BLADE) ×3 IMPLANT
CHLORAPREP W/TINT 26ML (MISCELLANEOUS) ×6 IMPLANT
CLOTH BEACON ORANGE TIMEOUT ST (SAFETY) ×3 IMPLANT
COOLER CRYO IC GRAV AND TUBE (ORTHOPEDIC SUPPLIES) ×3 IMPLANT
COVER PROBE W GEL 5X96 (DRAPES) ×3 IMPLANT
CUFF CRYO KNEE LG 20X31 COOLER (ORTHOPEDIC SUPPLIES) IMPLANT
CUFF CRYO KNEE18X23 MED (MISCELLANEOUS) ×3 IMPLANT
CUFF TOURNIQUET SINGLE 34IN LL (TOURNIQUET CUFF) ×3 IMPLANT
CUFF TOURNIQUET SINGLE 44IN (TOURNIQUET CUFF) IMPLANT
CUTTER ANGLED DBL BITE 4.5 (BURR) IMPLANT
DECANTER SPIKE VIAL GLASS SM (MISCELLANEOUS) ×6 IMPLANT
ELECT REM PT RETURN 9FT ADLT (ELECTROSURGICAL)
ELECTRODE REM PT RTRN 9FT ADLT (ELECTROSURGICAL) IMPLANT
GAUZE SPONGE 4X4 12PLY STRL (GAUZE/BANDAGES/DRESSINGS) ×3 IMPLANT
GAUZE SPONGE 4X4 16PLY XRAY LF (GAUZE/BANDAGES/DRESSINGS) ×3 IMPLANT
GAUZE XEROFORM 5X9 LF (GAUZE/BANDAGES/DRESSINGS) ×3 IMPLANT
GLOVE BIOGEL M 7.0 STRL (GLOVE) ×3 IMPLANT
GLOVE BIOGEL PI IND STRL 7.0 (GLOVE) ×1 IMPLANT
GLOVE BIOGEL PI INDICATOR 7.0 (GLOVE) ×2
GLOVE EXAM NITRILE MD LF STRL (GLOVE) ×3 IMPLANT
GLOVE SKINSENSE NS SZ8.0 LF (GLOVE) ×2
GLOVE SKINSENSE STRL SZ8.0 LF (GLOVE) ×1 IMPLANT
GLOVE SS N UNI LF 8.5 STRL (GLOVE) ×3 IMPLANT
GOWN PREVENTION PLUS XLARGE (GOWN DISPOSABLE) ×3 IMPLANT
GOWN STRL REUS W/TWL LRG LVL3 (GOWN DISPOSABLE) ×6 IMPLANT
GOWN STRL REUS W/TWL XL LVL3 (GOWN DISPOSABLE) ×3 IMPLANT
HLDR LEG FOAM (MISCELLANEOUS) ×1 IMPLANT
IV NS IRRIG 3000ML ARTHROMATIC (IV SOLUTION) ×6 IMPLANT
KIT BLADEGUARD II DBL (SET/KITS/TRAYS/PACK) ×3 IMPLANT
KIT ROOM TURNOVER AP CYSTO (KITS) ×3 IMPLANT
LEG HOLDER FOAM (MISCELLANEOUS) ×2
MANIFOLD NEPTUNE II (INSTRUMENTS) ×3 IMPLANT
MARKER SKIN DUAL TIP RULER LAB (MISCELLANEOUS) ×3 IMPLANT
NEEDLE HYPO 18GX1.5 BLUNT FILL (NEEDLE) ×3 IMPLANT
NEEDLE HYPO 21X1.5 SAFETY (NEEDLE) ×3 IMPLANT
NEEDLE SPNL 18GX3.5 QUINCKE PK (NEEDLE) ×3 IMPLANT
NS IRRIG 1000ML POUR BTL (IV SOLUTION) ×3 IMPLANT
PACK ARTHRO LIMB DRAPE STRL (MISCELLANEOUS) ×3 IMPLANT
PAD ABD 5X9 TENDERSORB (GAUZE/BANDAGES/DRESSINGS) ×3 IMPLANT
PAD ABD 8X10 STRL (GAUZE/BANDAGES/DRESSINGS) ×3 IMPLANT
PAD ARMBOARD 7.5X6 YLW CONV (MISCELLANEOUS) ×3 IMPLANT
PADDING CAST COTTON 6X4 STRL (CAST SUPPLIES) ×3 IMPLANT
PADDING WEBRIL 6 STERILE (GAUZE/BANDAGES/DRESSINGS) ×3 IMPLANT
SET ARTHROSCOPY INST (INSTRUMENTS) ×3 IMPLANT
SET ARTHROSCOPY PUMP TUBE (IRRIGATION / IRRIGATOR) ×3 IMPLANT
SET BASIN LINEN APH (SET/KITS/TRAYS/PACK) ×3 IMPLANT
SPONGE GAUZE 4X4 12PLY STER LF (GAUZE/BANDAGES/DRESSINGS) ×3 IMPLANT
SUT ETHILON 3 0 FSL (SUTURE) ×3 IMPLANT
SYR 30ML LL (SYRINGE) ×3 IMPLANT
SYRINGE 10CC LL (SYRINGE) ×3 IMPLANT
WAND 50 DEG COVAC W/CORD (SURGICAL WAND) IMPLANT
WAND 90 DEG TURBOVAC W/CORD (SURGICAL WAND) ×3 IMPLANT
WAND ARTHRO PARAGON T2 (SURGICAL WAND) IMPLANT
WATER STERILE IRR 1000ML POUR (IV SOLUTION) ×3 IMPLANT
YANKAUER SUCT BULB TIP 10FT TU (MISCELLANEOUS) ×9 IMPLANT

## 2015-03-27 NOTE — Anesthesia Preprocedure Evaluation (Signed)
Anesthesia Evaluation  Patient identified by MRN, date of birth, ID band Patient awake    Reviewed: Allergy & Precautions, NPO status , Patient's Chart, lab work & pertinent test results  Airway Mallampati: III  TM Distance: >3 FB     Dental  (+) Teeth Intact, Partial Upper   Pulmonary neg pulmonary ROS,    breath sounds clear to auscultation       Cardiovascular hypertension, Pt. on medications  Rhythm:Regular Rate:Normal     Neuro/Psych    GI/Hepatic GERD  Medicated and Controlled,  Endo/Other    Renal/GU      Musculoskeletal  (+) Arthritis ,   Abdominal   Peds  Hematology   Anesthesia Other Findings   Reproductive/Obstetrics                             Anesthesia Physical Anesthesia Plan  ASA: II  Anesthesia Plan: General   Post-op Pain Management:    Induction: Intravenous  Airway Management Planned: LMA  Additional Equipment:   Intra-op Plan:   Post-operative Plan: Extubation in OR  Informed Consent: I have reviewed the patients History and Physical, chart, labs and discussed the procedure including the risks, benefits and alternatives for the proposed anesthesia with the patient or authorized representative who has indicated his/her understanding and acceptance.     Plan Discussed with:   Anesthesia Plan Comments:         Anesthesia Quick Evaluation

## 2015-03-27 NOTE — Anesthesia Procedure Notes (Signed)
Procedure Name: LMA Insertion Date/Time: 03/27/2015 7:40 AM Performed by: Despina Hidden Pre-anesthesia Checklist: Patient identified, Emergency Drugs available, Suction available and Patient being monitored Patient Re-evaluated:Patient Re-evaluated prior to inductionOxygen Delivery Method: Circle system utilized Preoxygenation: Pre-oxygenation with 100% oxygen Intubation Type: IV induction Ventilation: Mask ventilation without difficulty LMA: LMA inserted LMA Size: 4.0 Grade View: Grade III Tube type: Oral Number of attempts: 1 Placement Confirmation: positive ETCO2 and breath sounds checked- equal and bilateral Tube secured with: Tape Dental Injury: Teeth and Oropharynx as per pre-operative assessment

## 2015-03-27 NOTE — Interval H&P Note (Signed)
History and Physical Interval Note:  03/27/2015 7:20 AM  Tammy Kramer  has presented today for surgery, with the diagnosis of lateral meniscus tear  The various methods of treatment have been discussed with the patient and family. After consideration of risks, benefits and other options for treatment, the patient has consented to  Procedure(s): LEFT KNEE ARTHROSCOPY WITH LATERAL MENISECTOMY as a surgical intervention .  The patient's history has been reviewed, patient examined, no change in status, stable for surgery.  I have reviewed the patient's chart and labs.  Questions were answered to the patient's satisfaction.     Fuller Canada

## 2015-03-27 NOTE — Transfer of Care (Signed)
Immediate Anesthesia Transfer of Care Note  Patient: Tammy Kramer  Procedure(s) Performed: Procedure(s): KNEE ARTHROSCOPY WITH LATERAL MENISECTOMY (Left)  Patient Location: PACU  Anesthesia Type:General  Level of Consciousness: awake and patient cooperative  Airway & Oxygen Therapy: Patient Spontanous Breathing and Patient connected to face mask oxygen  Post-op Assessment: Report given to RN, Post -op Vital signs reviewed and stable and Patient moving all extremities  Post vital signs: Reviewed and stable  Last Vitals:  Filed Vitals:   03/27/15 0725  BP: 117/69  Resp: 16    Complications: No apparent anesthesia complications

## 2015-03-27 NOTE — Op Note (Signed)
03/27/2015  8:27 AM  PATIENT:  Tammy Kramer  64 y.o. female  PRE-OPERATIVE DIAGNOSIS:  lateral meniscus tear  POST-OPERATIVE DIAGNOSIS:  lateral meniscus tear  PROCEDURE:  Procedure(s): KNEE ARTHROSCOPY WITH LATERAL MENISECTOMY (Left)   Operative findings torn lateral meniscus. Chondral lesion medial femoral condyle requiring service approximately dime size contained anterior cruciate ligament PCL intact. Patellofemoral joint relatively normal. Mild synovitis.  Surgeon Romeo Apple there were no systems  Gen. anesthesia with LMA  occasions  Min no blood loss  We injected Marcaine with epinephrine  60 at the end of the surgery. EBL:  Total I/O In: 600 [I.V.:600] Out: -     SPECIMEN:  No Specimen  DISPOSITION OF SPECIMEN:  N/A  COUNTS:  YES  TOURNIQUET:    DICTATION: .Dragon Dictation and Note written in paper chart  PLAN OF CARE: Discharge to home after PACU  PATIENT DISPOSITION:  PACU - hemodynamically stable.   Delay start of Pharmacological VTE agent (>24hrs) due to surgical blood loss or risk of bleeding: not applicable  Standard knee arthroscopy was performed  Patient identification in the preoperative area using 2 parameters. Site marking performed and marked left knee. Dried up a completed.    Patient was taken to the operating room for general anesthesia. She had Ancef for preoperative anabiotic. After successful general anesthesia the patient in the supine position had her left leg prepped and draped sterilely. Timeout was completed. Scope was placed into the joint laterally. Diagnostic arthroscopy was performed. Reviewed the knee circumferentially starting the patellofemoral compartment and then placed a medial portal performed a synovitic to be to remove fat pad for visualization. Anterior cruciate ligament and PCL were intact. Medial meniscus was intact and was felt with the probe. Chondral lesion approximately 12 x 15 was noted it was contained he was in  the weightbearing area but the actual surrounding cartilage buffered the grade 3 defect from actually touching the tibia  The medial meniscectomy was performed with a shaver imbalanced the remaining rim with a ArthroCare wand., Stable remote permanent probe  Knee irrigated this wash mode of arthroscopy pump.  Injected 60 mL of Marcaine.  Weight-bear as tolerated crutches or walker  Follow-up Monday  Physical therapy starting next week

## 2015-03-27 NOTE — Brief Op Note (Signed)
03/27/2015  8:27 AM  PATIENT:  Tammy Kramer  64 y.o. female  PRE-OPERATIVE DIAGNOSIS:  lateral meniscus tear  POST-OPERATIVE DIAGNOSIS:  lateral meniscus tear  PROCEDURE:  Procedure(s): KNEE ARTHROSCOPY WITH LATERAL MENISECTOMY (Left)   Operative findings torn lateral meniscus. Chondral lesion medial femoral condyle requiring service approximately dime size contained anterior cruciate ligament PCL intact. Patellofemoral joint relatively normal. Mild synovitis.  Surgeon Chudney Scheffler there were no systems  Gen. anesthesia with LMA  occasions  Min no blood loss  We injected Marcaine with epinephrine  60 at the end of the surgery. EBL:  Total I/O In: 600 [I.V.:600] Out: -     SPECIMEN:  No Specimen  DISPOSITION OF SPECIMEN:  N/A  COUNTS:  YES  TOURNIQUET:    DICTATION: .Dragon Dictation and Note written in paper chart  PLAN OF CARE: Discharge to home after PACU  PATIENT DISPOSITION:  PACU - hemodynamically stable.   Delay start of Pharmacological VTE agent (>24hrs) due to surgical blood loss or risk of bleeding: not applicable  Standard knee arthroscopy was performed  Patient identification in the preoperative area using 2 parameters. Site marking performed and marked left knee. Dried up a completed.    Patient was taken to the operating room for general anesthesia. She had Ancef for preoperative anabiotic. After successful general anesthesia the patient in the supine position had her left leg prepped and draped sterilely. Timeout was completed. Scope was placed into the joint laterally. Diagnostic arthroscopy was performed. Reviewed the knee circumferentially starting the patellofemoral compartment and then placed a medial portal performed a synovitic to be to remove fat pad for visualization. Anterior cruciate ligament and PCL were intact. Medial meniscus was intact and was felt with the probe. Chondral lesion approximately 12 x 15 was noted it was contained he was in  the weightbearing area but the actual surrounding cartilage buffered the grade 3 defect from actually touching the tibia  The medial meniscectomy was performed with a shaver imbalanced the remaining rim with a ArthroCare wand., Stable remote permanent probe  Knee irrigated this wash mode of arthroscopy pump.  Injected 60 mL of Marcaine.  Weight-bear as tolerated crutches or walker  Follow-up Monday  Physical therapy starting next week 

## 2015-03-28 NOTE — Anesthesia Postprocedure Evaluation (Signed)
  Anesthesia Post-op Note  Patient: Tammy Kramer  Procedure(s) Performed: Procedure(s): KNEE ARTHROSCOPY WITH LATERAL MENISECTOMY (Left)  Patient Location: PACU  Anesthesia Type:General  Level of Consciousness: awake, alert  and oriented  Airway and Oxygen Therapy: Patient Spontanous Breathing  Post-op Pain: none  Post-op Assessment: Post-op Vital signs reviewed, Patient's Cardiovascular Status Stable, Respiratory Function Stable, Patent Airway, No signs of Nausea or vomiting and Adequate PO intake              Post-op Vital Signs: Reviewed and stable  Last Vitals:  Filed Vitals:   03/27/15 0955  BP: 145/75  Pulse: 71  Temp: 36.4 C  Resp: 16    Complications: No apparent anesthesia complicationsLate entry.

## 2015-03-30 ENCOUNTER — Encounter (HOSPITAL_COMMUNITY): Payer: Self-pay | Admitting: Orthopedic Surgery

## 2015-03-30 ENCOUNTER — Ambulatory Visit: Payer: Medicare Other | Admitting: Orthopedic Surgery

## 2015-03-31 ENCOUNTER — Ambulatory Visit (INDEPENDENT_AMBULATORY_CARE_PROVIDER_SITE_OTHER): Payer: Self-pay | Admitting: Orthopedic Surgery

## 2015-03-31 VITALS — BP 138/90 | Ht 64.5 in | Wt 188.0 lb

## 2015-03-31 DIAGNOSIS — Z4789 Encounter for other orthopedic aftercare: Secondary | ICD-10-CM

## 2015-03-31 DIAGNOSIS — S83282D Other tear of lateral meniscus, current injury, left knee, subsequent encounter: Secondary | ICD-10-CM

## 2015-03-31 NOTE — Patient Instructions (Signed)
Start home exercises perform daily 2-3 times per day continue ice until swelling has completely resolved  Walk on the leg weightbearing as tolerated. You can stop using walker or crutches whenever you're comfortable

## 2015-03-31 NOTE — Progress Notes (Signed)
PRE-OPERATIVE DIAGNOSIS:  lateral meniscus tear  POST-OPERATIVE DIAGNOSIS:  lateral meniscus tear  PROCEDURE:  Procedure(s): KNEE ARTHROSCOPY WITH LATERAL MENISECTOMY (Left)   Operative findings torn lateral meniscus. Chondral lesion medial femoral condyle requiring service approximately dime size contained anterior cruciate ligament PCL intact. Patellofemoral joint relatively normal. Mild synovitis.   Patient ID: Tammy Kramer, female   DOB: 02-Sep-1950, 64 y.o.   MRN: [id number]  Follow up visit  Chief Complaint  Patient presents with  . Follow-up    post op 1, SALK W/LM, DOS 03/27/15    BP 138/90 mmHg  Ht 5' 4.5" (1.638 m)  Wt 188 lb (85.276 kg)  BMI 31.78 kg/m2  Encounter Diagnoses  Name Primary?  . Lateral meniscus tear, left, subsequent encounter   . Surgical aftercare, musculoskeletal system Yes    She's not using weightbearing gait we encouraged her to weight-bear. Portals were clean.  4 WK F/U

## 2015-04-08 DIAGNOSIS — Z23 Encounter for immunization: Secondary | ICD-10-CM | POA: Diagnosis not present

## 2015-04-08 DIAGNOSIS — M1990 Unspecified osteoarthritis, unspecified site: Secondary | ICD-10-CM | POA: Diagnosis not present

## 2015-04-08 DIAGNOSIS — K219 Gastro-esophageal reflux disease without esophagitis: Secondary | ICD-10-CM | POA: Diagnosis not present

## 2015-04-08 DIAGNOSIS — I10 Essential (primary) hypertension: Secondary | ICD-10-CM | POA: Diagnosis not present

## 2015-04-16 DIAGNOSIS — Z23 Encounter for immunization: Secondary | ICD-10-CM | POA: Diagnosis not present

## 2015-04-28 ENCOUNTER — Encounter: Payer: Self-pay | Admitting: Orthopedic Surgery

## 2015-04-28 ENCOUNTER — Ambulatory Visit (INDEPENDENT_AMBULATORY_CARE_PROVIDER_SITE_OTHER): Payer: Medicare Other | Admitting: Orthopedic Surgery

## 2015-04-28 VITALS — BP 151/93 | Ht 64.5 in | Wt 185.0 lb

## 2015-04-28 DIAGNOSIS — M7662 Achilles tendinitis, left leg: Secondary | ICD-10-CM | POA: Diagnosis not present

## 2015-04-28 DIAGNOSIS — Z4789 Encounter for other orthopedic aftercare: Secondary | ICD-10-CM

## 2015-04-28 DIAGNOSIS — S83282D Other tear of lateral meniscus, current injury, left knee, subsequent encounter: Secondary | ICD-10-CM

## 2015-04-28 NOTE — Progress Notes (Signed)
Status post arthroscopy left knee would lateral meniscectomy. The patient says her knee is doing great but she is having continued pain in the posterior aspect of her left heel. She's having trouble weightbearing.  I will address the knee. The knee has recovered nicely. She's regained full range of motion and no swelling  Left heel recurrent problem status post one injection plantar aspect with x-ray showing plantar spur  Today's pain is in the posterior aspect of the heel near the Achilles tendon insertion. Quality pain is a dull ache it is severe it causes her to have increased pain with weightbearing and it is associated with stiffness in the morning and it's also associated with increased pain the longer she stands on it  Review of systems related to that there is no numbness or tingling in the foot and there is been no rash over the skin in that area  She has changed her shoe wear  Examination shows she has a slight limp there is tenderness at the Achilles insertion and the retrocalcaneal bursa she has a slight pes planus with valgus of the heel. She has decreased range of motion overall at the ankle joint but it is stable. No weakness is noted around the ankle joint Achilles tendon intact skin is normal sensations normal as well and pulses are good  Diagnosis retrocalcaneal bursitis and tendinitis of the Achilles left foot and ankle  Recommend injection ice BATHS come back in 2 weeks may repeat injection  Injection of verbal consent was obtained to do the injection. Timeout was completed to confirm injection site  40 mg Depo-Medrol used to inject the area with alcohol and ethyl chloride used to prep the skin

## 2015-04-28 NOTE — Patient Instructions (Signed)
Daily ice foot baths

## 2015-05-06 DIAGNOSIS — J309 Allergic rhinitis, unspecified: Secondary | ICD-10-CM | POA: Diagnosis not present

## 2015-05-06 DIAGNOSIS — K219 Gastro-esophageal reflux disease without esophagitis: Secondary | ICD-10-CM | POA: Diagnosis not present

## 2015-05-06 DIAGNOSIS — I10 Essential (primary) hypertension: Secondary | ICD-10-CM | POA: Diagnosis not present

## 2015-05-06 DIAGNOSIS — R000 Tachycardia, unspecified: Secondary | ICD-10-CM | POA: Diagnosis not present

## 2015-05-06 DIAGNOSIS — E785 Hyperlipidemia, unspecified: Secondary | ICD-10-CM | POA: Diagnosis not present

## 2015-05-11 DIAGNOSIS — M1990 Unspecified osteoarthritis, unspecified site: Secondary | ICD-10-CM | POA: Diagnosis not present

## 2015-05-11 DIAGNOSIS — I10 Essential (primary) hypertension: Secondary | ICD-10-CM | POA: Diagnosis not present

## 2015-05-18 ENCOUNTER — Ambulatory Visit (INDEPENDENT_AMBULATORY_CARE_PROVIDER_SITE_OTHER): Payer: Medicare Other | Admitting: Obstetrics and Gynecology

## 2015-05-18 ENCOUNTER — Encounter: Payer: Self-pay | Admitting: Obstetrics and Gynecology

## 2015-05-18 VITALS — BP 170/96 | Ht 64.5 in | Wt 188.0 lb

## 2015-05-18 DIAGNOSIS — Z01419 Encounter for gynecological examination (general) (routine) without abnormal findings: Secondary | ICD-10-CM

## 2015-05-18 NOTE — Progress Notes (Signed)
Patient ID: Tammy NumbersNannie S Kramer, female   DOB: 06/24/1950, 64 y.o.   MRN: [id number] Pt here today for annual exam. Pt states that she would like to discuss when she can stop having Pap smears. Pt is S/P hysterectomy.

## 2015-05-18 NOTE — Progress Notes (Signed)
Patient ID: Tammy Kramer, female   DOB: 11/20/1950, 64 y.o.   MRN: [id number]   Assessment:  Annual Gyn Exam Plan:  1. pap smear not done. Cervix surgically removed. Pt advised that she does not need to return for pap smears. 2. Return in 3 years 3    Annual mammogram and breast self-exams advised Subjective:  Tammy Kramer is a 64 y.o. female with a history of TVH with removal of cervix, who presents for annual exam. No LMP recorded. Patient has had a hysterectomy. The patient has no complaints.  She states she would like to know when she can stop having pap smears. She reports a history of total abdominal hysterectomy done approximately 30 years ago, with immediate surgical complications requiring hospitalization, and a second TVH following that. Patient is unable to recall whether her cervix was removed.  She reports she has not seen any concerning masses on her breasts. She notes she has had a mammogram this year.  She also states she had a colonoscopy last year.  Patient reports a knee arthroscopy performed last month. She states she has been doing well since, although she did have cortisone injections in her heel, which seemed to increase her blood pressure to about 170/90. She states she has been controlling her resulting high blood pressure with Diovan-HCT.   The following portions of the patient's history were reviewed and updated as appropriate: allergies, current medications, past family history, past medical history, past social history, past surgical history and problem list. Past Medical History  Diagnosis Date  . GERD (gastroesophageal reflux disease)   . Chronic constipation   . Hypercholesterolemia   . Hypertension   . Back pain   . Hemorrhoids   . Seasonal allergies   . Arthritis     Past Surgical History  Procedure Laterality Date  . Colonoscopy   10/16/2006    NUR: 3 mm polyp ablated via cold biopsy from the splenic flexure.External hemorrhoids, hyperplastic  polyp  . Esophagogastroduodenoscopy  06/14/2006    NUR: Normal esophagogastroduodenoscopy  . Cholecystectomy    . Abdominal hysterectomy    . Back surgery    . C4-c5 fusion    . Foot surgery      X 5-bone spurs and tendon release; morton's neuroma.  . Hand surgery Right     X 2-carpal tunnel release and ganglion cyst removed.  . Rotator cuff repair Right   . Colonoscopy  Feb 2011    Dr. Jena Gauss: internal hemorrhoids, otherwise normal.   . Esophagogastroduodenoscopy  Feb 2011    Dr. Jena Gauss: inflamed-appearing esophagus with overlying distal esophageal erosions superimposed on noncritical Schatzki ring, s/p dilation and biopsy. Benign path  . Colonoscopy N/A 04/08/2014    RMR: Internal hemorrhoids; colonic diverticulosis( few  as described above) hematochezia likely secondary to hemorrhoids in the setting of constipation.  . Hemorrhoid banding  2016    Dr.Rourk  . Cholecystectomy    . Knee arthroscopy with lateral menisectomy Left 03/27/2015    Procedure: KNEE ARTHROSCOPY WITH LATERAL MENISECTOMY;  Surgeon: Vickki Hearing, MD;  Location: [facility];  Service: Orthopedics;  Laterality: Left;     Current outpatient prescriptions:  .  docusate sodium (COLACE) 100 MG capsule, Take 100 mg by mouth 2 (two) times daily., Disp: , Rfl:  .  gabapentin (NEURONTIN) 100 MG capsule, Take 1 capsule (100 mg total) by mouth 3 (three) times daily., Disp: 90 capsule, Rfl: 0 .  hydrocortisone (ANUSOL-HC) 2.5 % rectal cream, Place 1  application rectally 2 (two) times daily. (Patient taking differently: Place 1 application rectally 2 (two) times daily as needed for hemorrhoids. ), Disp: 30 g, Rfl: 1 .  lansoprazole (PREVACID) 30 MG capsule, Take 30 mg by mouth daily at 12 noon., Disp: , Rfl:  .  Linaclotide (LINZESS) 145 MCG CAPS capsule, Take 1 capsule (145 mcg total) by mouth daily., Disp: 30 capsule, Rfl: 5 .  loratadine (CLARITIN) 10 MG tablet, Take 10 mg by mouth daily., Disp: , Rfl:  .  polyethylene  glycol (MIRALAX / GLYCOLAX) packet, Take 17 g by mouth every other day., Disp: , Rfl:  .  promethazine (PHENERGAN) 12.5 MG tablet, Take 1 tablet (12.5 mg total) by mouth every 6 (six) hours as needed for nausea or vomiting., Disp: 30 tablet, Rfl: 0 .  simvastatin (ZOCOR) 20 MG tablet, Take 20 mg by mouth daily., Disp: , Rfl:  .  valsartan-hydrochlorothiazide (DIOVAN-HCT) 160-12.5 MG per tablet, Take 1 tablet by mouth daily., Disp: , Rfl:   Review of Systems Constitutional: negative Gastrointestinal: negative Genitourinary: negative A complete review of systems was obtained and all systems are negative except as noted in the HPI and PMH.    Objective:  BP 170/96 mmHg  Ht 5' 4.5" (1.638 m)  Wt 188 lb (85.276 kg)  BMI 31.78 kg/m2   BMI: Body mass index is 31.78 kg/(m^2).  General Appearance: Alert, appropriate appearance for age. No acute distress HEENT: Grossly normal Neck / Thyroid:  Cardiovascular: RRR; normal S1, S2, no murmur Lungs: CTA bilaterally Back: No CVAT Breast Exam: No dimpling, nipple retraction or discharge. No masses or nodes., Normal to inspection and Normal breast tissue bilaterally Gastrointestinal: Soft, non-tender, no masses or organomegaly Pelvic Exam: Vulva and vagina appear normal. Bimanual exam reveals normal uterus and adnexa. External genitalia: normal general appearance Urinary system: urethral meatus normal Vaginal: good support of the cuff, well-healed surgical, normal mucosa without prolapse or lesions, normal without tenderness, induration or masses and normal rugae,  Cervix: removed surgically Adnexa: normal bimanual exam Uterus: removed surgically Rectal: good sphincter tone, no masses and guaiac negative  Rectovaginal: not indicated and normal rectal, no masses Lymphatic Exam: Non-palpable nodes in neck, clavicular, axillary, or inguinal regions  Skin: no rash or abnormalities Neurologic: Normal gait and speech, no tremor  Psychiatric: Alert and  oriented, appropriate affect.  Urinalysis:Not done  Christin BachJohn Zaineb Nowaczyk. MD Pgr [phone] 11:22 AM   By signing my name below, I, Ronney LionSuzanne Le, attest that this documentation has been prepared under the direction and in the presence of Tilda BurrowJohn Artesia Berkey V, MD. Electronically Signed: Ronney LionSuzanne Le, ED Scribe. 05/18/2015. 12:04 PM.  I personally performed the services described in this documentation, which was SCRIBED in my presence. The recorded information has been reviewed and considered accurate. It has been edited as necessary during review. Tilda BurrowFERGUSON,Dezaray Shibuya V, MD   (scribe attestation statement)

## 2015-05-21 ENCOUNTER — Ambulatory Visit (INDEPENDENT_AMBULATORY_CARE_PROVIDER_SITE_OTHER): Payer: Self-pay | Admitting: Orthopedic Surgery

## 2015-05-21 VITALS — BP 145/89 | Ht 64.5 in | Wt 188.0 lb

## 2015-05-21 DIAGNOSIS — Z4789 Encounter for other orthopedic aftercare: Secondary | ICD-10-CM

## 2015-05-21 DIAGNOSIS — M7662 Achilles tendinitis, left leg: Secondary | ICD-10-CM

## 2015-05-21 NOTE — Progress Notes (Signed)
This is a follow-up visit for arthroscopy left knee on October 7 and she received an injection left heel for tendinitis. Initial plain film did not show any major calcifications in the Achilles tendon she did have a plantar spur which was asymptomatic.  She is doing well in the knee area and is improving in the heel area now she can walk and stay up at night with pain  Has a little bit of clicking and popping in the knee from the patellofemoral joint but has free and easy range of motion with no swelling  She has mild tenderness in her Achilles tendon  The patient will follow-up with us on an as-needed basis

## 2015-06-26 DIAGNOSIS — I10 Essential (primary) hypertension: Secondary | ICD-10-CM | POA: Diagnosis not present

## 2015-06-26 DIAGNOSIS — E785 Hyperlipidemia, unspecified: Secondary | ICD-10-CM | POA: Diagnosis not present

## 2015-06-26 DIAGNOSIS — K219 Gastro-esophageal reflux disease without esophagitis: Secondary | ICD-10-CM | POA: Diagnosis not present

## 2015-07-20 DIAGNOSIS — K219 Gastro-esophageal reflux disease without esophagitis: Secondary | ICD-10-CM | POA: Diagnosis not present

## 2015-07-20 DIAGNOSIS — E785 Hyperlipidemia, unspecified: Secondary | ICD-10-CM | POA: Diagnosis not present

## 2015-07-20 DIAGNOSIS — I10 Essential (primary) hypertension: Secondary | ICD-10-CM | POA: Diagnosis not present

## 2015-07-27 ENCOUNTER — Ambulatory Visit (INDEPENDENT_AMBULATORY_CARE_PROVIDER_SITE_OTHER): Payer: Medicare Other | Admitting: Orthopedic Surgery

## 2015-07-27 ENCOUNTER — Ambulatory Visit (INDEPENDENT_AMBULATORY_CARE_PROVIDER_SITE_OTHER): Payer: Medicare Other

## 2015-07-27 VITALS — BP 150/85 | Ht 64.5 in | Wt 181.0 lb

## 2015-07-27 DIAGNOSIS — M654 Radial styloid tenosynovitis [de Quervain]: Secondary | ICD-10-CM

## 2015-07-27 DIAGNOSIS — M79641 Pain in right hand: Secondary | ICD-10-CM | POA: Diagnosis not present

## 2015-07-27 NOTE — Patient Instructions (Signed)
Splint x 6 weeks  Ice 3 x daily for 20 minutes  advil or aleve

## 2015-07-27 NOTE — Progress Notes (Signed)
Subjective:     Patient ID: Tammy Kramer, female   DOB: [birth year], 65 y.o.   MRN: [id number]  Chief Complaint  Patient presents with  . 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 Symptoms are pain, swelling, giving way hard to pick things up. Tingling over the wrist and thumb and index finger burning aching pain which is constant worse after activity and 7 out of 10. No previous workup. Previous treatment over-the-counter medication such as Tylenol and Advil Aleve.    Review of Systems  HENT: Positive for postnasal drip.   Gastrointestinal: Positive for constipation.  Musculoskeletal: Positive for back pain.  Allergic/Immunologic: Positive for environmental allergies.       Objective:   Physical Exam  Constitutional: She is oriented to person, place, and time. She appears well-developed and well-nourished.  HENT:  Head: Normocephalic.  Neck: Normal range of motion. Neck supple.  Cardiovascular: Intact distal pulses.   Musculoskeletal:  Right wrist exam: Inspection palpation revealed tenderness and swelling of the first extensor compartment. Range of motion is normal but painful ulnar deviation. Wrist joint is stable and shuck test Watson test. Strength is normal.  No lymph nodes are positive at this area.  Left wrist normal range of motion strength no swelling  Neurological: She is alert and oriented to person, place, and time. She has normal reflexes. She displays normal reflexes. She exhibits normal muscle tone.  Skin: Skin is warm.  Psychiatric: She has a normal mood and affect. Her behavior is normal. Judgment and thought content normal.  Nursing note and vitals reviewed.      Assessment:     I did order some plain films of the right wrist. No arthritis is seen no fracture dislocation or bone  abnormality. Impression normal right wrist x-ray      Plan:     Recommend right oh splint for de Quervain's syndrome ice 3 times a day continue over-the-counter anti-inflammatories as tolerated  Follow-up 6 weeks recheck right wrist

## 2015-08-07 DIAGNOSIS — N3941 Urge incontinence: Secondary | ICD-10-CM | POA: Diagnosis not present

## 2015-08-07 DIAGNOSIS — J00 Acute nasopharyngitis [common cold]: Secondary | ICD-10-CM | POA: Diagnosis not present

## 2015-09-03 ENCOUNTER — Other Ambulatory Visit: Payer: Self-pay | Admitting: Orthopedic Surgery

## 2015-09-03 DIAGNOSIS — M79641 Pain in right hand: Secondary | ICD-10-CM

## 2015-09-07 ENCOUNTER — Ambulatory Visit (INDEPENDENT_AMBULATORY_CARE_PROVIDER_SITE_OTHER): Payer: Medicare Other | Admitting: Orthopedic Surgery

## 2015-09-07 ENCOUNTER — Encounter: Payer: Self-pay | Admitting: Orthopedic Surgery

## 2015-09-07 VITALS — BP 129/83 | Ht 64.5 in | Wt 181.0 lb

## 2015-09-07 DIAGNOSIS — M1811 Unilateral primary osteoarthritis of first carpometacarpal joint, right hand: Secondary | ICD-10-CM | POA: Diagnosis not present

## 2015-09-07 DIAGNOSIS — M79644 Pain in right finger(s): Secondary | ICD-10-CM | POA: Diagnosis not present

## 2015-09-07 DIAGNOSIS — M25541 Pain in joints of right hand: Secondary | ICD-10-CM

## 2015-09-07 NOTE — Progress Notes (Addendum)
Chief Complaint  Patient presents with  . 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  Neurological: Negative for tingling and focal weakness.    Exam Physical Exam  Constitutional: She is oriented to person, place, and time. She appears well-developed and well-nourished. No distress.  Cardiovascular: Intact distal pulses.   Neurological: She is alert and oriented to person, place, and time. She exhibits normal muscle tone. Coordination normal.  Skin: Skin is warm and dry. No rash noted. She is not diaphoretic. No erythema. No pallor.  Psychiatric: She has a normal mood and affect. Her behavior is normal. Judgment and thought content normal.  Thumb is tender mild pain with compression grind test Negative carpal tunnel exam no numbness no tingling negative Tinel's over the carpal tunnel grip strength normal  Right  thumb injection Medication  1 mL of 40 mg Depo-Medrol  2 mL of 1% lidocaine plain  Ethyl chloride for anesthesia  Verbal consent was obtained timeout was taken to confirm the injection site as right thumb CMC joint  Alcohol was used to prepare the skin along with ethyl chloride and then the injection was made [facility] without complication sterile bandage applied patient will follow-up in a month diagnosis CMC arthrosis

## 2015-09-11 DIAGNOSIS — H00012 Hordeolum externum right lower eyelid: Secondary | ICD-10-CM | POA: Diagnosis not present

## 2015-09-11 DIAGNOSIS — N390 Urinary tract infection, site not specified: Secondary | ICD-10-CM | POA: Diagnosis not present

## 2015-10-12 ENCOUNTER — Ambulatory Visit: Payer: Medicare Other | Admitting: Orthopedic Surgery

## 2015-11-17 DIAGNOSIS — H524 Presbyopia: Secondary | ICD-10-CM | POA: Diagnosis not present

## 2015-11-17 DIAGNOSIS — H52203 Unspecified astigmatism, bilateral: Secondary | ICD-10-CM | POA: Diagnosis not present

## 2015-11-17 DIAGNOSIS — H2513 Age-related nuclear cataract, bilateral: Secondary | ICD-10-CM | POA: Diagnosis not present

## 2015-12-29 ENCOUNTER — Other Ambulatory Visit (HOSPITAL_COMMUNITY): Payer: Self-pay | Admitting: Internal Medicine

## 2015-12-29 DIAGNOSIS — Z1231 Encounter for screening mammogram for malignant neoplasm of breast: Secondary | ICD-10-CM

## 2016-01-25 ENCOUNTER — Ambulatory Visit (HOSPITAL_COMMUNITY)
Admission: RE | Admit: 2016-01-25 | Discharge: 2016-01-25 | Disposition: A | Discharge status: Home / Self Care | Payer: Medicare Other | Source: Ambulatory Visit | Attending: Internal Medicine | Admitting: Internal Medicine

## 2016-01-25 DIAGNOSIS — Z1231 Encounter for screening mammogram for malignant neoplasm of breast: Secondary | ICD-10-CM | POA: Diagnosis not present

## 2016-02-09 DIAGNOSIS — M79674 Pain in right toe(s): Secondary | ICD-10-CM | POA: Diagnosis not present

## 2016-02-09 DIAGNOSIS — L600 Ingrowing nail: Secondary | ICD-10-CM | POA: Diagnosis not present

## 2016-02-09 DIAGNOSIS — B351 Tinea unguium: Secondary | ICD-10-CM | POA: Diagnosis not present

## 2016-02-23 DIAGNOSIS — M79674 Pain in right toe(s): Secondary | ICD-10-CM | POA: Diagnosis not present

## 2016-02-23 DIAGNOSIS — L600 Ingrowing nail: Secondary | ICD-10-CM | POA: Diagnosis not present

## 2016-02-23 DIAGNOSIS — B351 Tinea unguium: Secondary | ICD-10-CM | POA: Diagnosis not present

## 2016-04-29 DIAGNOSIS — L600 Ingrowing nail: Secondary | ICD-10-CM | POA: Diagnosis not present

## 2016-05-02 DIAGNOSIS — Z23 Encounter for immunization: Secondary | ICD-10-CM | POA: Diagnosis not present

## 2016-05-17 DIAGNOSIS — L600 Ingrowing nail: Secondary | ICD-10-CM | POA: Diagnosis not present

## 2016-05-30 ENCOUNTER — Encounter: Payer: Self-pay | Admitting: Orthopedic Surgery

## 2016-05-30 ENCOUNTER — Ambulatory Visit (INDEPENDENT_AMBULATORY_CARE_PROVIDER_SITE_OTHER): Payer: Medicare Other | Admitting: Orthopedic Surgery

## 2016-05-30 DIAGNOSIS — M1811 Unilateral primary osteoarthritis of first carpometacarpal joint, right hand: Secondary | ICD-10-CM

## 2016-05-30 DIAGNOSIS — G5601 Carpal tunnel syndrome, right upper limb: Secondary | ICD-10-CM

## 2016-05-30 NOTE — Progress Notes (Signed)
Patient ID: Tammy Kramer, female   DOB: 12/02/1950, 65 y.o.   MRN: [id number]  Chief Complaint  Patient presents with  . Follow-up    RIGHT WRIST PAIN    HPI Tammy Tammy Kramer is a 65 y.o. female.   HPI  Recheck right wrist possible de Quervain's versus CMC arthritis still having pain  Also complains of heavy feeling in the fingers at night that they're like concrete  Review of Systems Review of Systems  No numbness or tingling at present but history of carpal tunnel syndrome in the right and left wrist status post carpal tunnel surgery right wrist Physical Exam  tenderness over the [facility] joint painful range of motion but full range of motion of the [facility] joint no loss and pinch strength CMC joint stable pulses good sensation normal right hand    Physical Exam  Injection first CMC joint right hand  Verbal consent was given timeout was taken to confirm injection site 40 mg of Depo-Medrol and 3 mL 1% lidocaine injected at the [facility] joint #1 right hand  Recommend carpal tunnel splint at night  Return 6 weeks

## 2016-05-31 DIAGNOSIS — Z0000 Encounter for general adult medical examination without abnormal findings: Secondary | ICD-10-CM | POA: Diagnosis not present

## 2016-05-31 DIAGNOSIS — E782 Mixed hyperlipidemia: Secondary | ICD-10-CM | POA: Diagnosis not present

## 2016-05-31 DIAGNOSIS — I10 Essential (primary) hypertension: Secondary | ICD-10-CM | POA: Diagnosis not present

## 2016-05-31 DIAGNOSIS — E785 Hyperlipidemia, unspecified: Secondary | ICD-10-CM | POA: Diagnosis not present

## 2016-05-31 DIAGNOSIS — R799 Abnormal finding of blood chemistry, unspecified: Secondary | ICD-10-CM | POA: Diagnosis not present

## 2016-05-31 DIAGNOSIS — B351 Tinea unguium: Secondary | ICD-10-CM | POA: Diagnosis not present

## 2016-05-31 DIAGNOSIS — J309 Allergic rhinitis, unspecified: Secondary | ICD-10-CM | POA: Diagnosis not present

## 2016-05-31 DIAGNOSIS — K219 Gastro-esophageal reflux disease without esophagitis: Secondary | ICD-10-CM | POA: Diagnosis not present

## 2016-06-15 ENCOUNTER — Encounter: Payer: Self-pay | Admitting: Obstetrics and Gynecology

## 2016-06-15 ENCOUNTER — Ambulatory Visit (INDEPENDENT_AMBULATORY_CARE_PROVIDER_SITE_OTHER): Payer: Medicare Other | Admitting: Obstetrics and Gynecology

## 2016-06-15 DIAGNOSIS — N898 Other specified noninflammatory disorders of vagina: Secondary | ICD-10-CM

## 2016-06-15 DIAGNOSIS — R1084 Generalized abdominal pain: Secondary | ICD-10-CM

## 2016-06-15 MED ORDER — FLUCONAZOLE 150 MG PO TABS: 150.0000 mg | ORAL_TABLET | Freq: Once | ORAL | 1 refills | Status: AC

## 2016-06-15 MED ORDER — MICONAZOLE NITRATE 2 % VA CREA: 1.0000 | TOPICAL_CREAM | Freq: Every day | VAGINAL | Status: DC

## 2016-06-15 NOTE — Progress Notes (Signed)
Patient ID: Tammy NumbersNannie S Festa, female   DOB: 10/08/1950, 65 y.o.   MRN: [id number]   [facility]  @DATE @            Patient name: Tammy Kramer MRN [id number]  Date of birth: 10/10/1950  CC & HPI:   Chief Complaint  Patient presents with  . vaginal discharge with odor    pain in right side off and on     Tammy Numbersannie S Henton is a 65 y.o. female presenting today for malodorous vaginal discharge that began recently with associated intermittent suprapubic abdominal pain.   ROS:  ROS +malodorous vaginal discharge  +suprapubic abdominal pain   Pertinent History Reviewed:   Reviewed: Significant for abdominal hysterectomy  Medical         Past Medical History:  Diagnosis Date  . Arthritis   . Back pain   . Chronic constipation   . GERD (gastroesophageal reflux disease)   . Hemorrhoids   . Hypercholesterolemia   . Hypertension   . Seasonal allergies                               Surgical Hx:    Past Surgical History:  Procedure Laterality Date  . ABDOMINAL HYSTERECTOMY    . BACK SURGERY    . C4-C5 fusion    . cholecystectomy    . CHOLECYSTECTOMY    . COLONOSCOPY   10/16/2006   NUR: 3 mm polyp ablated via cold biopsy from the splenic flexure.External hemorrhoids, hyperplastic polyp  . COLONOSCOPY  Feb 2011   Dr. Jena Gaussourk: internal hemorrhoids, otherwise normal.   . COLONOSCOPY N/A 04/08/2014   RMR: Internal hemorrhoids; colonic diverticulosis( few  as described above) hematochezia likely secondary to hemorrhoids in the setting of constipation.  Marland Kitchen. DILATION AND CURETTAGE OF UTERUS     multiple  . ESOPHAGOGASTRODUODENOSCOPY  06/14/2006   NUR: Normal esophagogastroduodenoscopy  . ESOPHAGOGASTRODUODENOSCOPY  Feb 2011   Dr. Jena Gaussourk: inflamed-appearing esophagus with overlying distal esophageal erosions superimposed on noncritical Schatzki ring, s/p dilation and biopsy. Benign path  . foot surgery     X 5-bone spurs and tendon release; morton's neuroma.  Marland Kitchen. HAND SURGERY  Right    X 2-carpal tunnel release and ganglion cyst removed.  Marland Kitchen. HEMORRHOID BANDING  2016   Dr.Rourk  . KNEE ARTHROSCOPY WITH LATERAL MENISECTOMY Left 03/27/2015   Procedure: KNEE ARTHROSCOPY WITH LATERAL MENISECTOMY;  Surgeon: Vickki HearingStanley E Harrison, MD;  Location: [facility];  Service: Orthopedics;  Laterality: Left;  . ROTATOR CUFF REPAIR Right    Medications: Reviewed & Updated - see associated section                       Current Outpatient Prescriptions:  .  lansoprazole (PREVACID) 30 MG capsule, Take 30 mg by mouth daily at 12 noon., Disp: , Rfl:  .  loratadine (CLARITIN) 10 MG tablet, Take 10 mg by mouth daily., Disp: , Rfl:  .  simvastatin (ZOCOR) 20 MG tablet, Take 40 mg by mouth daily. , Disp: , Rfl:  .  valsartan-hydrochlorothiazide (DIOVAN-HCT) 160-12.5 MG per tablet, Take 2 tablets by mouth daily. , Disp: , Rfl:    Social History: Reviewed -  reports that she has never smoked. She has never used smokeless tobacco.  Objective Findings:  Vitals: Blood pressure 138/78, pulse 70, height 5' 4.5" (1.638 m), weight 187 lb (84.8 kg).  Physical Examination: General  appearance - alert, well appearing, and in no distress Mental status - alert, oriented to person, place, and time Abdomen - soft, nontender, nondistended, no masses or organomegaly Pelvic -  VULVA: normal appearing vulva with no masses, tenderness or lesions,  VAGINA: normal appearing vagina with normal color and discharge, no lesions. Healthy appearing, well-healed vaginal cuff. Healthy appearing vaginal secretions with few clumps. BIMANUAL: Cuff is smooth, well supported, minimal tenderness on the right side.  Cervix, uterus surgically removed.   Wet prep: occasional rare yeast  KOH: negative    Assessment & Plan:   A:  1. Monilial vulvovaginitis   P:  1. Rx Diflucan 2. Pt to use OTC Monistat  2. F/u PRN    By signing my name below, I, Doreatha MartinEva Mathews, attest that this documentation has been prepared under the  direction and in the presence of Tilda BurrowJohn V Anaiah Mcmannis, MD. Electronically Signed: Doreatha MartinEva Mathews, ED Scribe. 06/15/16. 2:18 PM.  I personally performed the services described in this documentation, which was SCRIBED in my presence. The recorded information has been reviewed and considered accurate. It has been edited as necessary during review. Tilda BurrowFERGUSON,Oreta Soloway V, MD

## 2016-07-11 ENCOUNTER — Encounter: Payer: Self-pay | Admitting: Orthopedic Surgery

## 2016-07-11 ENCOUNTER — Ambulatory Visit (INDEPENDENT_AMBULATORY_CARE_PROVIDER_SITE_OTHER): Payer: Medicare Other | Admitting: Orthopedic Surgery

## 2016-07-11 DIAGNOSIS — M1811 Unilateral primary osteoarthritis of first carpometacarpal joint, right hand: Secondary | ICD-10-CM | POA: Diagnosis not present

## 2016-07-11 DIAGNOSIS — G5601 Carpal tunnel syndrome, right upper limb: Secondary | ICD-10-CM

## 2016-07-11 NOTE — Progress Notes (Signed)
Patient ID: Tammy Kramer, female   DOB: 05/07/1951, 66 y.o.   MRN: [id number]  Chief Complaint  Patient presents with  . Follow-up    Rt CMC OA    HPI Tammy Kramer is a 66 y.o. female.   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 degree she received one injection with good results but still having some discomfort over the right [facility] joint  Review of Systems Review of Systems  History of carpal tunnel symptoms bracing has helped her carpal tunnel syndrome in terms of numbness and tingling in night pain Physical Exam  Exam reveals swelling and tenderness over the right CMC joint with normal range of motion in the wrist and thumb no instability pinch strength normal skin intact pulses good capillary refill normal no sensory deficit elicited     MEDICAL DECISION MAKING  DATA     DIAGNOSIS  Encounter Diagnoses  Name Primary?  . Primary osteoarthritis of first carpometacarpal joint of right hand Yes  . Carpal tunnel syndrome of right wrist    Carpal tunnel symptoms improved continue bracing  PLAN(RISK)    Repeat injection   Right  thumb injection Medication  1 mL of 40 mg Depo-Medrol  2 mL of 1% lidocaine plain  Ethyl chloride for anesthesia  Verbal consent was obtained timeout was taken to confirm the injection site as right thumb  Alcohol was used to prepare the skin along with ethyl chloride and then the injection was made in the carpometacarpal joint there were no complications   Follow-up 2 months

## 2016-07-22 ENCOUNTER — Encounter: Payer: Self-pay | Admitting: Internal Medicine

## 2016-08-11 DIAGNOSIS — J069 Acute upper respiratory infection, unspecified: Secondary | ICD-10-CM | POA: Diagnosis not present

## 2016-08-11 DIAGNOSIS — J309 Allergic rhinitis, unspecified: Secondary | ICD-10-CM | POA: Diagnosis not present

## 2016-09-05 DIAGNOSIS — J329 Chronic sinusitis, unspecified: Secondary | ICD-10-CM | POA: Diagnosis not present

## 2016-09-05 DIAGNOSIS — I10 Essential (primary) hypertension: Secondary | ICD-10-CM | POA: Diagnosis not present

## 2016-09-05 DIAGNOSIS — J309 Allergic rhinitis, unspecified: Secondary | ICD-10-CM | POA: Diagnosis not present

## 2016-09-12 ENCOUNTER — Ambulatory Visit: Payer: Medicare Other | Admitting: Orthopedic Surgery

## 2016-10-10 ENCOUNTER — Ambulatory Visit (INDEPENDENT_AMBULATORY_CARE_PROVIDER_SITE_OTHER): Payer: Medicare Other | Admitting: Otolaryngology

## 2016-10-10 DIAGNOSIS — J343 Hypertrophy of nasal turbinates: Secondary | ICD-10-CM | POA: Diagnosis not present

## 2016-10-10 DIAGNOSIS — J310 Chronic rhinitis: Secondary | ICD-10-CM

## 2016-10-10 DIAGNOSIS — R070 Pain in throat: Secondary | ICD-10-CM

## 2016-10-13 DIAGNOSIS — J309 Allergic rhinitis, unspecified: Secondary | ICD-10-CM | POA: Diagnosis not present

## 2016-10-13 DIAGNOSIS — J40 Bronchitis, not specified as acute or chronic: Secondary | ICD-10-CM | POA: Diagnosis not present

## 2016-10-13 DIAGNOSIS — K219 Gastro-esophageal reflux disease without esophagitis: Secondary | ICD-10-CM | POA: Diagnosis not present

## 2016-10-24 ENCOUNTER — Encounter (HOSPITAL_COMMUNITY): Payer: Self-pay

## 2016-10-24 ENCOUNTER — Emergency Department (HOSPITAL_COMMUNITY): Payer: Medicare Other

## 2016-10-24 ENCOUNTER — Emergency Department (HOSPITAL_COMMUNITY)
Admission: EM | Admit: 2016-10-24 | Discharge: 2016-10-24 | Disposition: A | Discharge status: Home / Self Care | Payer: Medicare Other | Attending: Emergency Medicine | Admitting: Emergency Medicine

## 2016-10-24 DIAGNOSIS — S39012A Strain of muscle, fascia and tendon of lower back, initial encounter: Secondary | ICD-10-CM | POA: Diagnosis not present

## 2016-10-24 DIAGNOSIS — S0911XA Strain of muscle and tendon of head, initial encounter: Secondary | ICD-10-CM | POA: Diagnosis not present

## 2016-10-24 DIAGNOSIS — M546 Pain in thoracic spine: Secondary | ICD-10-CM | POA: Diagnosis not present

## 2016-10-24 DIAGNOSIS — M542 Cervicalgia: Secondary | ICD-10-CM | POA: Insufficient documentation

## 2016-10-24 DIAGNOSIS — R52 Pain, unspecified: Secondary | ICD-10-CM | POA: Diagnosis not present

## 2016-10-24 DIAGNOSIS — M549 Dorsalgia, unspecified: Secondary | ICD-10-CM | POA: Insufficient documentation

## 2016-10-24 DIAGNOSIS — Y999 Unspecified external cause status: Secondary | ICD-10-CM | POA: Insufficient documentation

## 2016-10-24 DIAGNOSIS — I10 Essential (primary) hypertension: Secondary | ICD-10-CM | POA: Diagnosis not present

## 2016-10-24 DIAGNOSIS — Y92410 Unspecified street and highway as the place of occurrence of the external cause: Secondary | ICD-10-CM | POA: Insufficient documentation

## 2016-10-24 DIAGNOSIS — R51 Headache: Secondary | ICD-10-CM | POA: Insufficient documentation

## 2016-10-24 DIAGNOSIS — S0990XA Unspecified injury of head, initial encounter: Secondary | ICD-10-CM | POA: Diagnosis present

## 2016-10-24 DIAGNOSIS — S199XXA Unspecified injury of neck, initial encounter: Secondary | ICD-10-CM | POA: Diagnosis not present

## 2016-10-24 DIAGNOSIS — M545 Low back pain: Secondary | ICD-10-CM | POA: Diagnosis not present

## 2016-10-24 DIAGNOSIS — Y9389 Activity, other specified: Secondary | ICD-10-CM | POA: Insufficient documentation

## 2016-10-24 DIAGNOSIS — Z79899 Other long term (current) drug therapy: Secondary | ICD-10-CM | POA: Diagnosis not present

## 2016-10-24 DIAGNOSIS — S161XXA Strain of muscle, fascia and tendon at neck level, initial encounter: Secondary | ICD-10-CM | POA: Diagnosis not present

## 2016-10-24 MED ORDER — TRAMADOL HCL 50 MG PO TABS: 50.0000 mg | ORAL_TABLET | Freq: Four times a day (QID) | ORAL | 0 refills | Status: DC | PRN
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
## 2016-10-24 NOTE — ED Notes (Signed)
Pt returned from CT °

## 2016-10-24 NOTE — Discharge Instructions (Signed)
Take Tylenol or Motrin for pain and follow-up with her doctor if any problems
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
## 2016-11-15 ENCOUNTER — Ambulatory Visit: Payer: Medicare Other | Admitting: Orthopedic Surgery

## 2016-11-21 ENCOUNTER — Ambulatory Visit (INDEPENDENT_AMBULATORY_CARE_PROVIDER_SITE_OTHER): Payer: Medicare Other | Admitting: Otolaryngology

## 2016-11-24 ENCOUNTER — Other Ambulatory Visit (HOSPITAL_COMMUNITY): Payer: Self-pay | Admitting: Internal Medicine

## 2016-11-24 DIAGNOSIS — Z1231 Encounter for screening mammogram for malignant neoplasm of breast: Secondary | ICD-10-CM

## 2016-11-28 ENCOUNTER — Encounter: Payer: Self-pay | Admitting: Gastroenterology

## 2016-11-28 ENCOUNTER — Ambulatory Visit (INDEPENDENT_AMBULATORY_CARE_PROVIDER_SITE_OTHER): Payer: Medicare Other | Admitting: Gastroenterology

## 2016-11-28 VITALS — BP 127/76 | HR 79 | Temp 97.3°F | Ht 64.0 in | Wt 186.4 lb

## 2016-11-28 DIAGNOSIS — K219 Gastro-esophageal reflux disease without esophagitis: Secondary | ICD-10-CM | POA: Diagnosis not present

## 2016-11-28 DIAGNOSIS — K641 Second degree hemorrhoids: Secondary | ICD-10-CM | POA: Diagnosis not present

## 2016-11-28 DIAGNOSIS — K625 Hemorrhage of anus and rectum: Secondary | ICD-10-CM
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
## 2016-11-28 NOTE — Patient Instructions (Signed)
1. I will let you know if Dr. Jena Gaussourk recommends updating your colonoscopy given recent rectal bleeding. If we update your colonoscopy now, we will also plan for upper endoscopy to evaluate your reflux. Further recommendations to follow.  2. For now continue prevacid once daily.  3. I will review records from Dr. Suszanne Connerseoh.

## 2016-11-29 DIAGNOSIS — B351 Tinea unguium: Secondary | ICD-10-CM | POA: Diagnosis not present

## 2016-11-30 NOTE — Assessment & Plan Note (Addendum)
Recent flare of GERD. Primarily nocturnal symptoms, waking up strangling likely due to regurgitation. Coughing predominately at nighttime when she lays down. Has seen ENT, patient describes fiberoptic study and was told that she had redness around her vocal cords likely related to acid reflux. Added ranitidine at bedtime but patient did not tolerate. Currently on lansoprazole 30 mg daily. Consider increasing lansoprazole to twice a day versus  EGD, pending review of ENT records.
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
## 2016-11-30 NOTE — Progress Notes (Signed)
cc'ed to pcp °

## 2016-12-04 NOTE — Progress Notes (Signed)
Discussed with RMR. Plan for TCS/EGD for rectal bleeding/refractory gerd. Please schedule.  

## 2016-12-05 DIAGNOSIS — K219 Gastro-esophageal reflux disease without esophagitis: Secondary | ICD-10-CM | POA: Diagnosis not present

## 2016-12-05 DIAGNOSIS — E785 Hyperlipidemia, unspecified: Secondary | ICD-10-CM | POA: Diagnosis not present

## 2016-12-05 DIAGNOSIS — I10 Essential (primary) hypertension: Secondary | ICD-10-CM | POA: Diagnosis not present

## 2016-12-05 NOTE — Progress Notes (Signed)
LMOM to call back

## 2016-12-06 ENCOUNTER — Other Ambulatory Visit: Payer: Self-pay

## 2016-12-06 DIAGNOSIS — K219 Gastro-esophageal reflux disease without esophagitis: Secondary | ICD-10-CM

## 2016-12-06 DIAGNOSIS — K625 Hemorrhage of anus and rectum: Secondary | ICD-10-CM

## 2016-12-06 MED ORDER — NA SULFATE-K SULFATE-MG SULF 17.5-3.13-1.6 GM/177ML PO SOLN: 1.0000 | ORAL | 0 refills | Status: DC

## 2016-12-06 NOTE — Progress Notes (Signed)
Pt called office. TCS/EGD with RMR scheduled for 12/08/16 at 8:15am. Instructions given on the phone, since she will need to start clear liquids after lunch today. [facility] she [facility] up prep (she is aware). Orders entered for procedure.

## 2016-12-08 ENCOUNTER — Ambulatory Visit (HOSPITAL_COMMUNITY)
Admission: RE | Admit: 2016-12-08 | Discharge: 2016-12-08 | Disposition: A | Discharge status: Home / Self Care | Payer: Medicare Other | Source: Ambulatory Visit | Attending: Internal Medicine | Admitting: Internal Medicine

## 2016-12-08 ENCOUNTER — Encounter (HOSPITAL_COMMUNITY)
Admission: RE | Disposition: A | Discharge status: Home / Self Care | Payer: Self-pay | Source: Ambulatory Visit | Attending: Internal Medicine

## 2016-12-08 ENCOUNTER — Encounter (HOSPITAL_COMMUNITY): Payer: Self-pay

## 2016-12-08 DIAGNOSIS — Z8249 Family history of ischemic heart disease and other diseases of the circulatory system: Secondary | ICD-10-CM | POA: Diagnosis not present

## 2016-12-08 DIAGNOSIS — E7800 Pure hypercholesterolemia, unspecified: Secondary | ICD-10-CM | POA: Insufficient documentation

## 2016-12-08 DIAGNOSIS — R1310 Dysphagia, unspecified: Secondary | ICD-10-CM | POA: Insufficient documentation

## 2016-12-08 DIAGNOSIS — Z9049 Acquired absence of other specified parts of digestive tract: Secondary | ICD-10-CM | POA: Diagnosis not present

## 2016-12-08 DIAGNOSIS — K648 Other hemorrhoids: Secondary | ICD-10-CM

## 2016-12-08 DIAGNOSIS — M1990 Unspecified osteoarthritis, unspecified site: Secondary | ICD-10-CM | POA: Diagnosis not present

## 2016-12-08 DIAGNOSIS — Z885 Allergy status to narcotic agent status: Secondary | ICD-10-CM | POA: Insufficient documentation

## 2016-12-08 DIAGNOSIS — K222 Esophageal obstruction: Secondary | ICD-10-CM | POA: Diagnosis not present

## 2016-12-08 DIAGNOSIS — Z833 Family history of diabetes mellitus: Secondary | ICD-10-CM | POA: Insufficient documentation

## 2016-12-08 DIAGNOSIS — Z888 Allergy status to other drugs, medicaments and biological substances status: Secondary | ICD-10-CM | POA: Insufficient documentation

## 2016-12-08 DIAGNOSIS — I10 Essential (primary) hypertension: Secondary | ICD-10-CM | POA: Insufficient documentation

## 2016-12-08 DIAGNOSIS — K449 Diaphragmatic hernia without obstruction or gangrene: Secondary | ICD-10-CM | POA: Insufficient documentation

## 2016-12-08 DIAGNOSIS — K625 Hemorrhage of anus and rectum: Secondary | ICD-10-CM

## 2016-12-08 DIAGNOSIS — K921 Melena: Secondary | ICD-10-CM

## 2016-12-08 DIAGNOSIS — Z800 Family history of malignant neoplasm of digestive organs: Secondary | ICD-10-CM | POA: Diagnosis not present

## 2016-12-08 DIAGNOSIS — K219 Gastro-esophageal reflux disease without esophagitis: Secondary | ICD-10-CM

## 2016-12-08 DIAGNOSIS — K209 Esophagitis, unspecified: Secondary | ICD-10-CM | POA: Diagnosis not present

## 2016-12-08 HISTORY — PX: ESOPHAGOGASTRODUODENOSCOPY: SHX5428

## 2016-12-08 HISTORY — PX: COLONOSCOPY: SHX5424

## 2016-12-08 SURGERY — COLONOSCOPY
Anesthesia: Moderate Sedation

## 2016-12-08 MED ORDER — FENTANYL CITRATE (PF) 100 MCG/2ML IJ SOLN
INTRAMUSCULAR | Status: AC
  Filled 2016-12-08: qty 2

## 2016-12-08 MED ORDER — MIDAZOLAM HCL 5 MG/5ML IJ SOLN
INTRAMUSCULAR | Status: AC
  Filled 2016-12-08: qty 10

## 2016-12-08 MED ORDER — STERILE WATER FOR IRRIGATION IR SOLN
Status: DC | PRN
  Administered 2016-12-08: 08:00:00

## 2016-12-08 MED ORDER — LIDOCAINE VISCOUS 2 % MT SOLN
OROMUCOSAL | Status: AC
  Filled 2016-12-08: qty 15

## 2016-12-08 MED ORDER — FENTANYL CITRATE (PF) 100 MCG/2ML IJ SOLN
INTRAMUSCULAR | Status: DC | PRN
  Administered 2016-12-08: 25 ug via INTRAVENOUS
  Administered 2016-12-08: 50 ug via INTRAVENOUS
  Administered 2016-12-08: 25 ug via INTRAVENOUS

## 2016-12-08 MED ORDER — ONDANSETRON HCL 4 MG/2ML IJ SOLN
INTRAMUSCULAR | Status: AC
  Filled 2016-12-08: qty 2

## 2016-12-08 MED ORDER — MEPERIDINE HCL 100 MG/ML IJ SOLN
INTRAMUSCULAR | Status: AC
  Filled 2016-12-08: qty 2

## 2016-12-08 MED ORDER — ONDANSETRON HCL 4 MG/2ML IJ SOLN
INTRAMUSCULAR | Status: DC | PRN
  Administered 2016-12-08: 4 mg via INTRAVENOUS

## 2016-12-08 MED ORDER — MIDAZOLAM HCL 5 MG/5ML IJ SOLN
INTRAMUSCULAR | Status: DC | PRN
  Administered 2016-12-08: 1 mg via INTRAVENOUS
  Administered 2016-12-08 (×2): 2 mg via INTRAVENOUS
  Administered 2016-12-08: 1 mg via INTRAVENOUS

## 2016-12-08 MED ORDER — SODIUM CHLORIDE 0.9 % IV SOLN
INTRAVENOUS | Status: DC
  Administered 2016-12-08: 08:00:00 via INTRAVENOUS

## 2016-12-08 MED ORDER — LIDOCAINE VISCOUS 2 % MT SOLN
OROMUCOSAL | Status: DC | PRN
Start: 2016-12-08 — End: 2016-12-08
  Administered 2016-12-08: 1 via OROMUCOSAL

## 2016-12-08 NOTE — Interval H&P Note (Signed)
History and Physical Interval Note:  12/08/2016 8:20 AM  Tammy Kramer  has presented today for surgery, with the diagnosis of rectal bleeding, refractory GERD  The various methods of treatment have been discussed with the patient and family. After consideration of risks, benefits and other options for treatment, the patient has consented to  Procedure(s) with comments: COLONOSCOPY (N/A) - 8:15am ESOPHAGOGASTRODUODENOSCOPY (EGD) (N/A) as a surgical intervention .  The patient's history has been reviewed, patient examined, no change in status, stable for surgery.  I have reviewed the patient's chart and labs.  Questions were answered to the patient's satisfaction.     Tammy Kramer  No change. Naproxen stopped by Dr. Felecia ShellingFanta. Persisting dysphagia. EGD with possible ED and colonoscopy per plan.  The risks, benefits, limitations, imponderables and alternatives regarding both EGD and colonoscopy have been reviewed with the patient. Questions have been answered. All parties agreeable.

## 2016-12-08 NOTE — Op Note (Signed)
[facility] Patient Name: Tammy Kramer Procedure Date: 12/08/2016 8:46 AM MRN: [id number] Date of Birth: 1951-01-23 Attending MD: Gennette Pac , MD CSN: [id number] Age: 66 Admit Type: Outpatient Procedure:                Colonoscopy Indications:              Hematochezia Providers:                Gennette Pac, MD, Jannett Celestine, RN, Burke Keels, Technician Referring MD:              Medicines:                Midazolam 6 mg IV, Fentanyl 100 micrograms IV,                            Ondansetron 4 mg IV Complications:            No immediate complications. Estimated Blood Loss:     Estimated blood loss: none. Procedure:                Pre-Anesthesia Assessment:                           - Prior to the procedure, a History and Physical                            was performed, and patient medications and                            allergies were reviewed. The patient's tolerance of                            previous anesthesia was also reviewed. The risks                            and benefits of the procedure and the sedation                            options and risks were discussed with the patient.                            All questions were answered, and informed consent                            was obtained. Prior Anticoagulants: The patient has                            taken no previous anticoagulant or antiplatelet                            agents. ASA Grade Assessment: II - A patient with  mild systemic disease. After reviewing the risks                            and benefits, the patient was deemed in                            satisfactory condition to undergo the procedure.                           After obtaining informed consent, the colonoscope                            was passed under direct vision. Throughout the                            procedure, the patient's blood pressure,  pulse, and                            oxygen saturations were monitored continuously. The                            [id number] ([id number]) scope was introduced through                            the anus and advanced to the the cecum, identified                            by appendiceal orifice and ileocecal valve. The                            ileocecal valve, appendiceal orifice, and rectum                            were photographed. The entire colon was well                            visualized. The patient tolerated the procedure                            well. The quality of the bowel preparation was                            adequate. Scope In: 8:50:32 AM Scope Out: 9:02:04 AM Scope Withdrawal Time: 0 hours 6 minutes 30 seconds  Total Procedure Duration: 0 hours 11 minutes 32 seconds  Findings:      The digital rectal exam was normal.      Non-bleeding internal hemorrhoids were found during retroflexion. The       hemorrhoids were small.      The exam was otherwise without abnormality on direct and retroflexion       views. Impression:               - Non-bleeding internal hemorrhoids.                           -  The examination was otherwise normal on direct                            and retroflexion views.                           - No specimens collected. Moderate Sedation:      Moderate (conscious) sedation was administered by the endoscopy nurse       and supervised by the endoscopist. The following parameters were       monitored: oxygen saturation, heart rate, blood pressure, respiratory       rate, EKG, adequacy of pulmonary ventilation, and response to care.       Total physician intraservice time was 20 minutes. Recommendation:           - Patient has a contact number available for                            emergencies. The signs and symptoms of potential                            delayed complications were discussed with the                             patient. Return to normal activities tomorrow.                            Written discharge instructions were provided to the                            patient.                           - Resume previous diet.                           - Continue present medications. Begin Benefiber 1                            tablespoon twice daily.                           - Await pathology results.                           - Repeat colonoscopy in 5 years for surveillance.                           - Return to GI clinic in 8 weeks. See EGD report. Procedure Code(s):        --- Professional ---                           [id number], Colonoscopy, flexible; diagnostic, including                            collection of specimen(s) by brushing or washing,  when performed (separate procedure)                           99152, Moderate sedation services provided by the                            same physician or other qualified health care                            professional performing the diagnostic or                            therapeutic service that the sedation supports,                            requiring the presence of an independent trained                            observer to assist in the monitoring of the                            patient's level of consciousness and physiological                            status; initial 15 minutes of intraservice time,                            patient age 1 years or older Diagnosis Code(s):        --- Professional ---                           K64.8, Other hemorrhoids                           K92.1, Melena (includes Hematochezia) CPT copyright 2016 American Medical Association. All rights reserved. The codes documented in this report are preliminary and upon coder review may  be revised to meet current compliance requirements. Gerrit Friends. Persis Graffius, MD Gennette Pac, MD 12/08/2016 9:19:47 AM This report has been signed  electronically. Number of Addenda: 0

## 2016-12-08 NOTE — Op Note (Signed)
[facility] Patient Name: Tammy Kramer Procedure Date: 12/08/2016 8:16 AM MRN: [id number] Date of Birth: 18-Apr-1951 Attending MD: Gennette Pac , MD CSN: [id number] Age: 66 Admit Type: Outpatient Procedure:                Upper GI endoscopy Indications:              Dysphagia Providers:                Gennette Pac, MD, Jannett Celestine, RN, Burke Keels, Technician Referring MD:              Medicines:                Midazolam 5 mg IV, Fentanyl 100 micrograms IV Complications:            No immediate complications. Estimated Blood Loss:     Estimated blood loss: none. Procedure:                Pre-Anesthesia Assessment:                           - Prior to the procedure, a History and Physical                            was performed, and patient medications and                            allergies were reviewed. The patient's tolerance of                            previous anesthesia was also reviewed. The risks                            and benefits of the procedure and the sedation                            options and risks were discussed with the patient.                            All questions were answered, and informed consent                            was obtained. Prior Anticoagulants: The patient has                            taken no previous anticoagulant or antiplatelet                            agents. ASA Grade Assessment: II - A patient with                            mild systemic disease. After reviewing the risks  and benefits, the patient was deemed in                            satisfactory condition to undergo the procedure.                           After obtaining informed consent, the endoscope was                            passed under direct vision. Throughout the                            procedure, the patient's blood pressure, pulse, and                            oxygen  saturations were monitored continuously. The                            [id number] ([id number]) scope was introduced through the                            mouth, and advanced to the second part of duodenum.                            The upper GI endoscopy was accomplished without                            difficulty. The patient tolerated the procedure                            well. Scope In: 8:36:04 AM Scope Out: 8:43:51 AM Total Procedure Duration: 0 hours 7 minutes 47 seconds  Findings:      LA Grade A (one or more mucosal breaks less than 5 mm, not extending       between tops of 2 mucosal folds) esophagitis was found. No Barrett's       epithelium seen.      A mild Schatzki ring (acquired) was found at the gastroesophageal       junction.      A small hiatal hernia was present.      The exam was otherwise without abnormality.      The duodenal bulb and second portion of the duodenum were normal. The       scope was withdrawn. Dilation was performed with a Maloney dilator with       mild resistance at 54 Fr. The dilation site was examined following       endoscope reinsertion and showed no change. Estimated blood loss: none. Impression:               - LA Grade A esophagitis.                           - Mild Schatzki ring. Dilated.                           - Small hiatal hernia.                           -  The examination was otherwise normal.                           - Normal duodenal bulb and second portion of the                            duodenum.                           - No specimens collected. Moderate Sedation:      Moderate (conscious) sedation was administered by the endoscopy nurse       and supervised by the endoscopist. The following parameters were       monitored: oxygen saturation, heart rate, blood pressure, respiratory       rate, EKG, adequacy of pulmonary ventilation, and response to care.       Total physician intraservice time was 17  minutes. Recommendation:           - Patient has a contact number available for                            emergencies. The signs and symptoms of potential                            delayed complications were discussed with the                            patient. Return to normal activities tomorrow.                            Written discharge instructions were provided to the                            patient.                           - Resume previous diet. Increase Prevacid to 30 mg                            twice daily.                           - Continue present medications.                           - No repeat upper endoscopy.                           - Return to GI office in 8 weeks. See colonoscopy                            report. Procedure Code(s):        --- Professional ---                           [id number], Esophagogastroduodenoscopy, flexible,  transoral; diagnostic, including collection of                            specimen(s) by brushing or washing, when performed                            (separate procedure)                           43450, Dilation of esophagus, by unguided sound or                            bougie, single or multiple passes                           99152, Moderate sedation services provided by the                            same physician or other qualified health care                            professional performing the diagnostic or                            therapeutic service that the sedation supports,                            requiring the presence of an independent trained                            observer to assist in the monitoring of the                            patient's level of consciousness and physiological                            status; initial 15 minutes of intraservice time,                            patient age 88 years or older Diagnosis Code(s):        --- Professional ---                            K20.9, Esophagitis, unspecified                           K22.2, Esophageal obstruction                           K44.9, Diaphragmatic hernia without obstruction or                            gangrene                           R13.10, Dysphagia, unspecified CPT copyright 2016 American Medical  Association. All rights reserved. The codes documented in this report are preliminary and upon coder review may  be revised to meet current compliance requirements. Gerrit Friendsobert M. Rourk, MD Gennette Pacobert Michael Rourk, MD 12/08/2016 8:48:51 AM This report has been signed electronically. Number of Addenda: 0

## 2016-12-08 NOTE — H&P (View-Only) (Signed)
Discussed with RMR. Plan for TCS/EGD for rectal bleeding/refractory gerd. Please schedule.

## 2016-12-08 NOTE — Discharge Instructions (Signed)
°Colonoscopy °Discharge Instructions ° °Read the instructions outlined below and refer to this sheet in the next few weeks. These discharge instructions provide you with general information on caring for yourself after you leave the hospital. Your doctor may also give you specific instructions. While your treatment has been planned according to the most current medical practices available, unavoidable complications occasionally occur. If you have any problems or questions after discharge, call Dr. Rourk at [phone]. °ACTIVITY °· You may resume your regular activity, but move at a slower pace for the next 24 hours.  °· Take frequent rest periods for the next 24 hours.  °· Walking will help get rid of the air and reduce the bloated feeling in your belly (abdomen).  °· No driving for 24 hours (because of the medicine (anesthesia) used during the test).   °· Do not sign any important legal documents or operate any machinery for 24 hours (because of the anesthesia used during the test).  °NUTRITION °· Drink plenty of fluids.  °· You may resume your normal diet as instructed by your doctor.  °· Begin with a light meal and progress to your normal diet. Heavy or fried foods are harder to digest and may make you feel sick to your stomach (nauseated).  °· Avoid alcoholic beverages for 24 hours or as instructed.  °MEDICATIONS °· You may resume your normal medications unless your doctor tells you otherwise.  °WHAT YOU CAN EXPECT TODAY °· Some feelings of bloating in the abdomen.  °· Passage of more gas than usual.  °· Spotting of blood in your stool or on the toilet paper.  °IF YOU HAD POLYPS REMOVED DURING THE COLONOSCOPY: °· No aspirin products for 7 days or as instructed.  °· No alcohol for 7 days or as instructed.  °· Eat a soft diet for the next 24 hours.  °FINDING OUT THE RESULTS OF YOUR TEST °Not all test results are available during your visit. If your test results are not back during the visit, make an appointment  with your caregiver to find out the results. Do not assume everything is normal if you have not heard from your caregiver or the medical facility. It is important for you to follow up on all of your test results.  °SEEK IMMEDIATE MEDICAL ATTENTION IF: °· You have more than a spotting of blood in your stool.  °· Your belly is swollen (abdominal distention).  °· You are nauseated or vomiting.  °· You have a temperature over 101.  °· You have abdominal pain or discomfort that is severe or gets worse throughout the day.  °EGD °Discharge instructions °Please read the instructions outlined below and refer to this sheet in the next few weeks. These discharge instructions provide you with general information on caring for yourself after you leave the hospital. Your doctor may also give you specific instructions. While your treatment has been planned according to the most current medical practices available, unavoidable complications occasionally occur. If you have any problems or questions after discharge, please call your doctor. °ACTIVITY °· You may resume your regular activity but move at a slower pace for the next 24 hours.  °· Take frequent rest periods for the next 24 hours.  °· Walking will help expel (get rid of) the air and reduce the bloated feeling in your abdomen.  °· No driving for 24 hours (because of the anesthesia (medicine) used during the test).  °· You may shower.  °· Do not sign any important   legal documents or operate any machinery for 24 hours (because of the anesthesia used during the test).  NUTRITION  Drink plenty of fluids.   You may resume your normal diet.   Begin with a light meal and progress to your normal diet.   Avoid alcoholic beverages for 24 hours or as instructed by your caregiver.  MEDICATIONS  You may resume your normal medications unless your caregiver tells you otherwise.  WHAT YOU CAN EXPECT TODAY  You may experience abdominal discomfort such as a feeling of fullness  or gas pains.  FOLLOW-UP  Your doctor will discuss the results of your test with you.  SEEK IMMEDIATE MEDICAL ATTENTION IF ANY OF THE FOLLOWING OCCUR:  Excessive nausea (feeling sick to your stomach) and/or vomiting.   Severe abdominal pain and distention (swelling).   Trouble swallowing.   Temperature over 101 F (37.8 C).   Rectal bleeding or vomiting of blood.    Constipation and GERD information provided  Increase Prevacid to 30 mg twice daily  Benefiber 1 tablespoon twice daily  Office visit with us in 8 weeks

## 2016-12-13 ENCOUNTER — Ambulatory Visit: Payer: Medicare Other | Admitting: Orthopedic Surgery

## 2016-12-13 ENCOUNTER — Encounter (HOSPITAL_COMMUNITY): Payer: Self-pay | Admitting: Internal Medicine

## 2016-12-29 ENCOUNTER — Telehealth: Payer: Self-pay

## 2016-12-29 NOTE — Telephone Encounter (Signed)
I have been trying to get the information to do a PA for this pt to get prevacid 30mg  bid. Pt finally called back and left a voicemail- she said she has medicare and uses [facility] in [place]. I called [facility] to ask them who her rx insurance provider is and was told that the pt picked up an rx for prevacid 30mg  bid on 12/09/16 and it didn't need a PA at this time.   Pt has tried and failed: omeprazole, zantac, and prevacid once a day.

## 2017-01-03 ENCOUNTER — Ambulatory Visit (INDEPENDENT_AMBULATORY_CARE_PROVIDER_SITE_OTHER): Payer: Medicare Other | Admitting: Orthopedic Surgery

## 2017-01-03 ENCOUNTER — Encounter: Payer: Self-pay | Admitting: Orthopedic Surgery

## 2017-01-03 DIAGNOSIS — M1811 Unilateral primary osteoarthritis of first carpometacarpal joint, right hand: Secondary | ICD-10-CM

## 2017-01-03 NOTE — Progress Notes (Signed)
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  . Primary osteoarthritis of first carpometacarpal joint of right hand Yes

## 2017-01-26 ENCOUNTER — Ambulatory Visit (HOSPITAL_COMMUNITY)
Admission: RE | Admit: 2017-01-26 | Discharge: 2017-01-26 | Disposition: A | Discharge status: Home / Self Care | Payer: Medicare Other | Source: Ambulatory Visit | Attending: Internal Medicine | Admitting: Internal Medicine

## 2017-01-26 DIAGNOSIS — Z1231 Encounter for screening mammogram for malignant neoplasm of breast: Secondary | ICD-10-CM | POA: Insufficient documentation

## 2017-02-06 ENCOUNTER — Encounter: Payer: Self-pay | Admitting: Internal Medicine

## 2017-02-07 DIAGNOSIS — B351 Tinea unguium: Secondary | ICD-10-CM | POA: Diagnosis not present

## 2017-03-07 DIAGNOSIS — E785 Hyperlipidemia, unspecified: Secondary | ICD-10-CM | POA: Diagnosis not present

## 2017-03-07 DIAGNOSIS — Z23 Encounter for immunization: Secondary | ICD-10-CM | POA: Diagnosis not present

## 2017-03-07 DIAGNOSIS — K219 Gastro-esophageal reflux disease without esophagitis: Secondary | ICD-10-CM | POA: Diagnosis not present

## 2017-03-07 DIAGNOSIS — I10 Essential (primary) hypertension: Secondary | ICD-10-CM | POA: Diagnosis not present

## 2017-04-04 DIAGNOSIS — H2513 Age-related nuclear cataract, bilateral: Secondary | ICD-10-CM | POA: Diagnosis not present

## 2017-04-04 DIAGNOSIS — H25013 Cortical age-related cataract, bilateral: Secondary | ICD-10-CM | POA: Diagnosis not present

## 2017-04-04 DIAGNOSIS — H1851 Endothelial corneal dystrophy: Secondary | ICD-10-CM | POA: Diagnosis not present

## 2017-04-19 NOTE — Patient Instructions (Signed)
Your procedure is scheduled on: 05/02/2017   [facility] at  730   AM.  Call this number if you have problems the morning of surgery: [phone]   Do not eat food or drink liquids :After Midnight.      Take these medicines the morning of surgery with A SIP OF WATER: prevacid, claritin, diovan.   Do not wear jewelry, make-up or nail polish.  Do not wear lotions, powders, or perfumes. You may wear deodorant.  Do not shave 48 hours prior to surgery.  Do not bring valuables to the hospital.  Contacts, dentures or bridgework may not be worn into surgery.  Leave suitcase in the car. After surgery it may be brought to your room.  For patients admitted to the hospital, checkout time is 11:00 AM the day of discharge.   Patients discharged the day of surgery will not be allowed to drive home.  :     Please read over the following fact sheets that you were given: Coughing and Deep Breathing, Surgical Site Infection Prevention, Anesthesia Post-op Instructions and Care and Recovery After Surgery    Cataract A cataract is a clouding of the lens of the eye. When a lens becomes cloudy, vision is reduced based on the degree and nature of the clouding. Many cataracts reduce vision to some degree. Some cataracts make people more near-sighted as they develop. Other cataracts increase glare. Cataracts that are ignored and become worse can sometimes look white. The white color can be seen through the pupil. CAUSES   Aging. However, cataracts may occur at any age, even in newborns.   Certain drugs.   Trauma to the eye.   Certain diseases such as diabetes.   Specific eye diseases such as chronic inflammation inside the eye or a sudden attack of a rare form of glaucoma.   Inherited or acquired medical problems.  SYMPTOMS   Gradual, progressive drop in vision in the affected eye.   Severe, rapid visual loss. This most often happens when trauma is the cause.  DIAGNOSIS  To detect a cataract,  an eye doctor examines the lens. Cataracts are best diagnosed with an exam of the eyes with the pupils enlarged (dilated) by drops.  TREATMENT  For an early cataract, vision may improve by using different eyeglasses or stronger lighting. If that does not help your vision, surgery is the only effective treatment. A cataract needs to be surgically removed when vision loss interferes with your everyday activities, such as driving, reading, or watching TV. A cataract may also have to be removed if it prevents examination or treatment of another eye problem. Surgery removes the cloudy lens and usually replaces it with a substitute lens (intraocular lens, IOL).  At a time when both you and your doctor agree, the cataract will be surgically removed. If you have cataracts in both eyes, only one is usually removed at a time. This allows the operated eye to heal and be out of danger from any possible problems after surgery (such as infection or poor wound healing). In rare cases, a cataract may be doing damage to your eye. In these cases, your caregiver may advise surgical removal right away. The vast majority of people who have cataract surgery have better vision afterward. HOME CARE INSTRUCTIONS  If you are not planning surgery, you may be asked to do the following:  Use different eyeglasses.   Use stronger or brighter lighting.   Ask your eye doctor about reducing  your medicine dose or changing medicines if it is thought that a medicine caused your cataract. Changing medicines does not make the cataract go away on its own.   Become familiar with your surroundings. Poor vision can lead to injury. Avoid bumping into things on the affected side. You are at a higher risk for tripping or falling.   Exercise extreme care when driving or operating machinery.   Wear sunglasses if you are sensitive to bright light or experiencing problems with glare.  SEEK IMMEDIATE MEDICAL CARE IF:   You have a worsening or  sudden vision loss.   You notice redness, swelling, or increasing pain in the eye.   You have a fever.  Document Released: 06/06/2005 Document Revised: 05/26/2011 Document Reviewed: 01/28/2011 [facility] Patient Information 2012 [place].PATIENT INSTRUCTIONS POST-ANESTHESIA  IMMEDIATELY FOLLOWING SURGERY:  Do not drive or operate machinery for the first twenty four hours after surgery.  Do not make any important decisions for twenty four hours after surgery or while taking narcotic pain medications or sedatives.  If you develop intractable nausea and vomiting or a severe headache please notify your doctor immediately.  FOLLOW-UP:  Please make an appointment with your surgeon as instructed. You do not need to follow up with anesthesia unless specifically instructed to do so.  WOUND CARE INSTRUCTIONS (if applicable):  Keep a dry clean dressing on the anesthesia/puncture wound site if there is drainage.  Once the wound has quit draining you may leave it open to air.  Generally you should leave the bandage intact for twenty four hours unless there is drainage.  If the epidural site drains for more than 36-48 hours please call the anesthesia department.  QUESTIONS?:  Please feel free to call your physician or the hospital operator if you have any questions, and they will be happy to assist you.

## 2017-04-25 ENCOUNTER — Encounter (HOSPITAL_COMMUNITY)
Admission: RE | Admit: 2017-04-25 | Discharge: 2017-04-25 | Disposition: A | Discharge status: Home / Self Care | Payer: Medicare Other | Source: Ambulatory Visit | Attending: Ophthalmology | Admitting: Ophthalmology

## 2017-04-25 ENCOUNTER — Encounter (HOSPITAL_COMMUNITY): Payer: Self-pay

## 2017-04-25 DIAGNOSIS — I10 Essential (primary) hypertension: Secondary | ICD-10-CM | POA: Diagnosis not present

## 2017-04-25 DIAGNOSIS — Z01810 Encounter for preprocedural cardiovascular examination: Secondary | ICD-10-CM | POA: Diagnosis not present

## 2017-04-25 DIAGNOSIS — H2511 Age-related nuclear cataract, right eye: Secondary | ICD-10-CM | POA: Insufficient documentation

## 2017-04-25 DIAGNOSIS — R9431 Abnormal electrocardiogram [ECG] [EKG]: Secondary | ICD-10-CM | POA: Diagnosis not present

## 2017-04-25 DIAGNOSIS — Z01818 Encounter for other preprocedural examination: Secondary | ICD-10-CM | POA: Diagnosis not present

## 2017-04-25 DIAGNOSIS — M25511 Pain in right shoulder: Secondary | ICD-10-CM | POA: Diagnosis not present

## 2017-04-25 DIAGNOSIS — R51 Headache: Secondary | ICD-10-CM | POA: Diagnosis not present

## 2017-04-25 HISTORY — DX: Adverse effect of unspecified anesthetic, initial encounter: T41.45XA

## 2017-04-25 HISTORY — DX: Other complications of anesthesia, initial encounter: T88.59XA

## 2017-04-25 LAB — BASIC METABOLIC PANEL
Anion gap: 11 (ref 5–15)
BUN: 20 mg/dL (ref 6–20)
CHLORIDE: 105 mmol/L (ref 101–111)
CO2: 24 mmol/L (ref 22–32)
Calcium: 9.7 mg/dL (ref 8.9–10.3)
Creatinine, Ser: 1.07 mg/dL — ABNORMAL HIGH (ref 0.44–1.00)
GFR, EST NON AFRICAN AMERICAN: 52 mL/min — AB (ref >60)
Glucose, Bld: 91 mg/dL (ref 65–99)
POTASSIUM: 3.6 mmol/L (ref 3.5–5.1)
SODIUM: 140 mmol/L (ref 135–145)

## 2017-04-25 LAB — CBC WITH DIFFERENTIAL/PLATELET
BASOS ABS: 0 10*3/uL (ref 0.0–0.1)
BASOS PCT: 0 %
EOS ABS: 0.1 10*3/uL (ref 0.0–0.7)
Eosinophils Relative: 1 %
HCT: 38.6 % (ref 36.0–46.0)
HEMOGLOBIN: 12.7 g/dL (ref 12.0–15.0)
LYMPHS ABS: 1.6 10*3/uL (ref 0.7–4.0)
Lymphocytes Relative: 21 %
MCH: 30.1 pg (ref 26.0–34.0)
MCHC: 32.9 g/dL (ref 30.0–36.0)
MCV: 91.5 fL (ref 78.0–100.0)
Monocytes Absolute: 0.5 10*3/uL (ref 0.1–1.0)
Monocytes Relative: 7 %
NEUTROS PCT: 71 %
Neutro Abs: 5.5 10*3/uL (ref 1.7–7.7)
Platelets: 295 10*3/uL (ref 150–400)
RBC: 4.22 MIL/uL (ref 3.87–5.11)
RDW: 12.6 % (ref 11.5–15.5)
WBC: 7.7 10*3/uL (ref 4.0–10.5)

## 2017-05-02 ENCOUNTER — Encounter (HOSPITAL_COMMUNITY)
Admission: RE | Disposition: A | Discharge status: Home / Self Care | Payer: Self-pay | Source: Ambulatory Visit | Attending: Ophthalmology

## 2017-05-02 ENCOUNTER — Ambulatory Visit (HOSPITAL_COMMUNITY)
Admission: RE | Admit: 2017-05-02 | Discharge: 2017-05-02 | Disposition: A | Discharge status: Home / Self Care | Payer: Medicare Other | Source: Ambulatory Visit | Attending: Ophthalmology | Admitting: Ophthalmology

## 2017-05-02 ENCOUNTER — Ambulatory Visit (HOSPITAL_COMMUNITY): Payer: Medicare Other | Admitting: Anesthesiology

## 2017-05-02 DIAGNOSIS — Z79899 Other long term (current) drug therapy: Secondary | ICD-10-CM | POA: Diagnosis not present

## 2017-05-02 DIAGNOSIS — H25013 Cortical age-related cataract, bilateral: Secondary | ICD-10-CM | POA: Diagnosis not present

## 2017-05-02 DIAGNOSIS — H2512 Age-related nuclear cataract, left eye: Secondary | ICD-10-CM | POA: Diagnosis not present

## 2017-05-02 DIAGNOSIS — K219 Gastro-esophageal reflux disease without esophagitis: Secondary | ICD-10-CM | POA: Insufficient documentation

## 2017-05-02 DIAGNOSIS — H25011 Cortical age-related cataract, right eye: Secondary | ICD-10-CM | POA: Diagnosis not present

## 2017-05-02 DIAGNOSIS — Z885 Allergy status to narcotic agent status: Secondary | ICD-10-CM | POA: Insufficient documentation

## 2017-05-02 DIAGNOSIS — H2511 Age-related nuclear cataract, right eye: Secondary | ICD-10-CM | POA: Diagnosis not present

## 2017-05-02 DIAGNOSIS — M1990 Unspecified osteoarthritis, unspecified site: Secondary | ICD-10-CM | POA: Diagnosis not present

## 2017-05-02 DIAGNOSIS — I10 Essential (primary) hypertension: Secondary | ICD-10-CM | POA: Diagnosis not present

## 2017-05-02 DIAGNOSIS — H25012 Cortical age-related cataract, left eye: Secondary | ICD-10-CM | POA: Diagnosis not present

## 2017-05-02 DIAGNOSIS — H2513 Age-related nuclear cataract, bilateral: Secondary | ICD-10-CM | POA: Insufficient documentation

## 2017-05-02 HISTORY — PX: CATARACT EXTRACTION W/PHACO: SHX586

## 2017-05-02 SURGERY — PHACOEMULSIFICATION, CATARACT, WITH IOL INSERTION
Anesthesia: Monitor Anesthesia Care | Site: Eye | Laterality: Right

## 2017-05-02 MED ORDER — MIDAZOLAM HCL 2 MG/2ML IJ SOLN
INTRAMUSCULAR | Status: AC
  Filled 2017-05-02: qty 2

## 2017-05-02 MED ORDER — KETOROLAC TROMETHAMINE 0.5 % OP SOLN
1.0000 [drp] | OPHTHALMIC | Status: AC
  Administered 2017-05-02 (×3): 1 [drp] via OPHTHALMIC

## 2017-05-02 MED ORDER — PROVISC 10 MG/ML IO SOLN
INTRAOCULAR | Status: DC | PRN
  Administered 2017-05-02: 0.85 mL via INTRAOCULAR

## 2017-05-02 MED ORDER — MIDAZOLAM HCL 2 MG/2ML IJ SOLN: 1.0000 mg | INTRAMUSCULAR | 0 refills | Status: DC

## 2017-05-02 MED ORDER — FENTANYL CITRATE (PF) 100 MCG/2ML IJ SOLN
25.0000 ug | Freq: Once | INTRAMUSCULAR | Status: AC
  Administered 2017-05-02: 25 ug via INTRAVENOUS

## 2017-05-02 MED ORDER — BSS IO SOLN
INTRAOCULAR | Status: DC | PRN
  Administered 2017-05-02: 500 mL

## 2017-05-02 MED ORDER — PHENYLEPHRINE HCL 2.5 % OP SOLN
1.0000 [drp] | OPHTHALMIC | Status: AC
  Administered 2017-05-02 (×3): 1 [drp] via OPHTHALMIC

## 2017-05-02 MED ORDER — BSS IO SOLN
INTRAOCULAR | Status: DC | PRN
  Administered 2017-05-02: 15 mL

## 2017-05-02 MED ORDER — CYCLOPENTOLATE-PHENYLEPHRINE 0.2-1 % OP SOLN
1.0000 [drp] | OPHTHALMIC | Status: AC
  Administered 2017-05-02 (×3): 1 [drp] via OPHTHALMIC

## 2017-05-02 MED ORDER — TETRACAINE HCL 0.5 % OP SOLN
1.0000 [drp] | OPHTHALMIC | Status: AC
  Administered 2017-05-02 (×3): 1 [drp] via OPHTHALMIC

## 2017-05-02 MED ORDER — FENTANYL CITRATE (PF) 100 MCG/2ML IJ SOLN
INTRAMUSCULAR | Status: AC
  Filled 2017-05-02: qty 2

## 2017-05-02 MED ORDER — LACTATED RINGERS IV SOLN
INTRAVENOUS | Status: DC
  Administered 2017-05-02: 09:00:00 via INTRAVENOUS

## 2017-05-02 MED ORDER — TETRACAINE 0.5 % OP SOLN OPTIME - NO CHARGE
OPHTHALMIC | Status: DC | PRN
  Administered 2017-05-02: 2 [drp] via OPHTHALMIC

## 2017-05-02 MED ORDER — MIDAZOLAM HCL 2 MG/2ML IJ SOLN
1.0000 mg | INTRAMUSCULAR | Status: AC
  Administered 2017-05-02: 2 mg via INTRAVENOUS

## 2017-05-02 SURGICAL SUPPLY — 9 items
CLOTH BEACON ORANGE TIMEOUT ST (SAFETY) ×3 IMPLANT
EYE SHIELD UNIVERSAL CLEAR (GAUZE/BANDAGES/DRESSINGS) ×3 IMPLANT
GLOVE BIOGEL PI IND STRL 7.0 (GLOVE) ×2 IMPLANT
GLOVE BIOGEL PI INDICATOR 7.0 (GLOVE) ×4
LENS ALC ACRYL/TECN (Ophthalmic Related) ×3 IMPLANT
PAD ARMBOARD 7.5X6 YLW CONV (MISCELLANEOUS) ×3 IMPLANT
TAPE SURG TRANSPORE 1 IN (GAUZE/BANDAGES/DRESSINGS) ×1 IMPLANT
TAPE SURGICAL TRANSPORE 1 IN (GAUZE/BANDAGES/DRESSINGS) ×2
WATER STERILE IRR 250ML POUR (IV SOLUTION) ×3 IMPLANT

## 2017-05-02 NOTE — Discharge Instructions (Signed)
°  °          [facility] Instructions °[address]- [place] [address]- °    ° °1. Avoid closing eyes tightly. One often closes the eye tightly when laughing, talking, sneezing, coughing or if they feel irritated. At these times, you should be careful not to close your eyes tightly. ° °2. Instill eye drops as instructed. To instill drops in your eye, open it, look up and have someone gently pull the lower lid down and instill a couple of drops inside the lower lid. ° °3. Do not touch upper lid. ° °4. Take Advil or Tylenol for pain. ° °5. You may use either eye for near work, such as reading or sewing and you may watch television. ° °6. You may have your hair done at the beauty parlor at any time. ° °7. Wear dark glasses with or without your own glasses if you are in bright light. ° °8. Call our office at [phone] or [phone] if you have sharp pain in your eye or unusual symptoms. ° °9.  FOLLOW UP WITH DR. SHAPIRO TODAY IN HIS [place] OFFICE AT 2:45pm. ° °  °I have received a copy of the above instructions and will follow them.  ° ° ° °IF YOU ARE IN IMMEDIATE DANGER CALL 911! ° °It is important for you to keep your follow-up appointment with your physician after discharge, OR, for you /your caregiver to make a follow-up appointment with your physician / medical provider after discharge. ° °Show these instructions to the next healthcare provider you see. ° °

## 2017-05-02 NOTE — Op Note (Signed)
Patient brought to the operating room and prepped and draped in the usual manner.  Lid speculum inserted in right eye.  Stab incision made at the twelve o'clock position.  Provisc instilled in the anterior chamber.   A 2.4 mm. Stab incision was made temporally.  An anterior capsulotomy was done with a bent 25 gauge needle.  The nucleus was hydrodissected.  The Phaco tip was inserted in the anterior chamber and the nucleus was emulsified.  CDE was 5.99.  The cortical material was then removed with the I and A tip.  Posterior capsule was the polished.  The anterior chamber was deepened with Provisc.  A 21.0 Diopter Alcon [id number] IOL was then inserted in the capsular bag.  Provisc was then removed with the I and A tip.  The wound was then hydrated.  Patient sent to the Recovery Room in good condition with follow up in my office.  Preoperative Diagnosis: Cortical and Nuclear Cataract OD Postoperative Diagnosis:  Same Procedure name: Kelman Phacoemulsification OD with IOL

## 2017-05-02 NOTE — Transfer of Care (Signed)
Immediate Anesthesia Transfer of Care Note  Patient: Tammy Kramer  Procedure(s) Performed: CATARACT EXTRACTION PHACO AND INTRAOCULAR LENS PLACEMENT (IOC) (Right Eye)  Patient Location: Short Stay  Anesthesia Type:MAC  Level of Consciousness: awake and alert   Airway & Oxygen Therapy: Patient Spontanous Breathing  Post-op Assessment: Report given to RN and Post -op Vital signs reviewed and stable  Post vital signs: Reviewed and stable  Last Vitals:  Vitals:   05/02/17 0900 05/02/17 0905  BP: (!) 109/58   Resp: 12 19  Temp:    SpO2: 100% 100%    Last Pain:  Vitals:   05/02/17 0810  TempSrc: Oral      Patients Stated Pain Goal: 6 (83/43/73 5789)  Complications: No apparent anesthesia complications

## 2017-05-02 NOTE — Anesthesia Postprocedure Evaluation (Signed)
Anesthesia Post Note  Patient: Tammy Kramer  Procedure(s) Performed: CATARACT EXTRACTION PHACO AND INTRAOCULAR LENS PLACEMENT (IOC) (Right Eye)  Patient location during evaluation: Short Stay Anesthesia Type: MAC Level of consciousness: awake and alert Pain management: pain level controlled Vital Signs Assessment: post-procedure vital signs reviewed and stable Respiratory status: spontaneous breathing Cardiovascular status: stable Postop Assessment: no apparent nausea or vomiting Anesthetic complications: no     Last Vitals:  Vitals:   05/02/17 0900 05/02/17 0905  BP: (!) 109/58   Resp: 12 19  Temp:    SpO2: 100% 100%    Last Pain:  Vitals:   05/02/17 0810  TempSrc: Oral                 Kirstina Leinweber

## 2017-05-02 NOTE — H&P (Signed)
The patient was re examined and there is no change in the patients condition since the original H and P. 

## 2017-05-02 NOTE — Anesthesia Procedure Notes (Signed)
Procedure Name: MAC Date/Time: 05/02/2017 9:05 AM Performed by: Vista Deck, CRNA Pre-anesthesia Checklist: Patient identified, Emergency Drugs available, Suction available, Timeout performed and Patient being monitored Patient Re-evaluated:Patient Re-evaluated prior to induction Oxygen Delivery Method: Nasal Cannula

## 2017-05-02 NOTE — Anesthesia Preprocedure Evaluation (Addendum)
Anesthesia Evaluation  Patient identified by MRN, date of birth, ID band Patient awake    Reviewed: Allergy & Precautions, NPO status , Patient's Chart, lab work & pertinent test results  History of Anesthesia Complications (+) PROLONGED EMERGENCE and history of anesthetic complications  Airway Mallampati: III  TM Distance: >3 FB     Dental  (+) Teeth Intact, Partial Upper   Pulmonary neg pulmonary ROS,    breath sounds clear to auscultation       Cardiovascular hypertension, Pt. on medications  Rhythm:Regular Rate:Normal     Neuro/Psych    GI/Hepatic GERD  Medicated and Controlled,  Endo/Other    Renal/GU      Musculoskeletal  (+) Arthritis ,   Abdominal   Peds  Hematology   Anesthesia Other Findings   Reproductive/Obstetrics                             Anesthesia Physical Anesthesia Plan  ASA: II  Anesthesia Plan: MAC   Post-op Pain Management:    Induction: Intravenous  PONV Risk Score and Plan:   Airway Management Planned: Nasal Cannula  Additional Equipment:   Intra-op Plan:   Post-operative Plan:   Informed Consent: I have reviewed the patients History and Physical, chart, labs and discussed the procedure including the risks, benefits and alternatives for the proposed anesthesia with the patient or authorized representative who has indicated his/her understanding and acceptance.     Plan Discussed with:   Anesthesia Plan Comments:         Anesthesia Quick Evaluation  

## 2017-05-04 ENCOUNTER — Encounter (HOSPITAL_COMMUNITY): Payer: Self-pay | Admitting: Ophthalmology

## 2017-05-05 ENCOUNTER — Ambulatory Visit: Payer: Medicare Other | Admitting: Orthopedic Surgery

## 2017-05-09 ENCOUNTER — Encounter (HOSPITAL_COMMUNITY): Payer: Self-pay

## 2017-05-09 ENCOUNTER — Encounter (HOSPITAL_COMMUNITY)
Admission: RE | Admit: 2017-05-09 | Discharge: 2017-05-09 | Disposition: A | Discharge status: Home / Self Care | Payer: Medicare Other | Source: Ambulatory Visit | Attending: Ophthalmology | Admitting: Ophthalmology

## 2017-05-16 ENCOUNTER — Ambulatory Visit (HOSPITAL_COMMUNITY)
Admission: RE | Admit: 2017-05-16 | Discharge: 2017-05-16 | Disposition: A | Discharge status: Home / Self Care | Payer: Medicare Other | Source: Ambulatory Visit | Attending: Ophthalmology | Admitting: Ophthalmology

## 2017-05-16 ENCOUNTER — Ambulatory Visit (HOSPITAL_COMMUNITY): Payer: Medicare Other | Admitting: Anesthesiology

## 2017-05-16 ENCOUNTER — Encounter (HOSPITAL_COMMUNITY): Payer: Self-pay | Admitting: Anesthesiology

## 2017-05-16 ENCOUNTER — Encounter (HOSPITAL_COMMUNITY)
Admission: RE | Disposition: A | Discharge status: Home / Self Care | Payer: Self-pay | Source: Ambulatory Visit | Attending: Ophthalmology

## 2017-05-16 DIAGNOSIS — M1990 Unspecified osteoarthritis, unspecified site: Secondary | ICD-10-CM | POA: Insufficient documentation

## 2017-05-16 DIAGNOSIS — Z79899 Other long term (current) drug therapy: Secondary | ICD-10-CM | POA: Diagnosis not present

## 2017-05-16 DIAGNOSIS — H2512 Age-related nuclear cataract, left eye: Secondary | ICD-10-CM | POA: Diagnosis not present

## 2017-05-16 DIAGNOSIS — I10 Essential (primary) hypertension: Secondary | ICD-10-CM | POA: Diagnosis not present

## 2017-05-16 DIAGNOSIS — K219 Gastro-esophageal reflux disease without esophagitis: Secondary | ICD-10-CM | POA: Diagnosis not present

## 2017-05-16 DIAGNOSIS — H25012 Cortical age-related cataract, left eye: Secondary | ICD-10-CM | POA: Diagnosis not present

## 2017-05-16 HISTORY — PX: CATARACT EXTRACTION W/PHACO: SHX586

## 2017-05-16 SURGERY — PHACOEMULSIFICATION, CATARACT, WITH IOL INSERTION
Anesthesia: Monitor Anesthesia Care | Site: Eye | Laterality: Left

## 2017-05-16 MED ORDER — LACTATED RINGERS IV SOLN
INTRAVENOUS | Status: DC | PRN
  Administered 2017-05-16: 10:00:00 via INTRAVENOUS

## 2017-05-16 MED ORDER — KETOROLAC TROMETHAMINE 0.5 % OP SOLN
1.0000 [drp] | OPHTHALMIC | Status: AC
  Administered 2017-05-16 (×2): 1 [drp] via OPHTHALMIC

## 2017-05-16 MED ORDER — PROVISC 10 MG/ML IO SOLN
INTRAOCULAR | Status: DC | PRN
  Administered 2017-05-16: 0.85 mL via INTRAOCULAR

## 2017-05-16 MED ORDER — CYCLOPENTOLATE-PHENYLEPHRINE 0.2-1 % OP SOLN
1.0000 [drp] | OPHTHALMIC | Status: AC
  Administered 2017-05-16 (×2): 1 [drp] via OPHTHALMIC

## 2017-05-16 MED ORDER — PHENYLEPHRINE HCL 2.5 % OP SOLN
1.0000 [drp] | OPHTHALMIC | Status: AC
  Administered 2017-05-16 (×2): 1 [drp] via OPHTHALMIC

## 2017-05-16 MED ORDER — TETRACAINE HCL 0.5 % OP SOLN
1.0000 [drp] | OPHTHALMIC | Status: AC
  Administered 2017-05-16 (×2): 1 [drp] via OPHTHALMIC

## 2017-05-16 MED ORDER — BSS IO SOLN
INTRAOCULAR | Status: DC | PRN
  Administered 2017-05-16: 15 mL

## 2017-05-16 MED ORDER — EPINEPHRINE PF 1 MG/ML IJ SOLN
INTRAOCULAR | Status: DC | PRN
  Administered 2017-05-16: 500 mL

## 2017-05-16 MED ORDER — TETRACAINE 0.5 % OP SOLN OPTIME - NO CHARGE
OPHTHALMIC | Status: DC | PRN
  Administered 2017-05-16: 2 [drp] via OPHTHALMIC

## 2017-05-16 SURGICAL SUPPLY — 10 items
CLOTH BEACON ORANGE TIMEOUT ST (SAFETY) ×3 IMPLANT
EYE SHIELD UNIVERSAL CLEAR (GAUZE/BANDAGES/DRESSINGS) ×3 IMPLANT
GLOVE BIOGEL PI IND STRL 6.5 (GLOVE) ×2 IMPLANT
GLOVE BIOGEL PI INDICATOR 6.5 (GLOVE) ×4
LENS ALC ACRYL/TECN (Ophthalmic Related) ×3 IMPLANT
PAD ARMBOARD 7.5X6 YLW CONV (MISCELLANEOUS) ×3 IMPLANT
RING MALYGIN (MISCELLANEOUS) ×3 IMPLANT
TAPE SURG TRANSPORE 1 IN (GAUZE/BANDAGES/DRESSINGS) ×1 IMPLANT
TAPE SURGICAL TRANSPORE 1 IN (GAUZE/BANDAGES/DRESSINGS) ×2
WATER STERILE IRR 250ML POUR (IV SOLUTION) ×3 IMPLANT

## 2017-05-16 NOTE — Anesthesia Procedure Notes (Signed)
Procedure Name: MAC Date/Time: 05/16/2017 10:53 AM Performed by: Andree Elk Amy A, CRNA Pre-anesthesia Checklist: Patient identified, Timeout performed, Emergency Drugs available, Suction available and Patient being monitored Oxygen Delivery Method: Nasal cannula

## 2017-05-16 NOTE — H&P (Signed)
The patient was re examined and there is no change in the patients condition since the original H and P. 

## 2017-05-16 NOTE — Anesthesia Postprocedure Evaluation (Signed)
Anesthesia Post Note  Patient: Tammy Kramer  Procedure(s) Performed: CATARACT EXTRACTION PHACO AND INTRAOCULAR LENS PLACEMENT (IOC) (Left Eye)  Patient location during evaluation: Short Stay Anesthesia Type: MAC Level of consciousness: awake and alert, oriented and patient cooperative Pain management: pain level controlled Vital Signs Assessment: post-procedure vital signs reviewed and stable Respiratory status: spontaneous breathing and respiratory function stable Cardiovascular status: stable Postop Assessment: no apparent nausea or vomiting Anesthetic complications: no     Last Vitals:  Vitals:   05/16/17 1010  BP: 133/76  Pulse: 79  Temp: 36.6 C    Last Pain:  Vitals:   05/16/17 1010  TempSrc: Oral                 Emmery Seiler A

## 2017-05-16 NOTE — Anesthesia Preprocedure Evaluation (Signed)
Anesthesia Evaluation  Patient identified by MRN, date of birth, ID band Patient awake    Reviewed: Allergy & Precautions, NPO status , Patient's Chart, lab work & pertinent test results  History of Anesthesia Complications (+) PROLONGED EMERGENCE and history of anesthetic complications  Airway Mallampati: III  TM Distance: >3 FB     Dental  (+) Teeth Intact, Partial Upper   Pulmonary neg pulmonary ROS,    breath sounds clear to auscultation       Cardiovascular hypertension, Pt. on medications  Rhythm:Regular Rate:Normal     Neuro/Psych    GI/Hepatic GERD  Medicated and Controlled,  Endo/Other    Renal/GU      Musculoskeletal  (+) Arthritis ,   Abdominal   Peds  Hematology   Anesthesia Other Findings   Reproductive/Obstetrics                             Anesthesia Physical Anesthesia Plan  ASA: II  Anesthesia Plan: MAC   Post-op Pain Management:    Induction: Intravenous  PONV Risk Score and Plan:   Airway Management Planned: Nasal Cannula  Additional Equipment:   Intra-op Plan:   Post-operative Plan:   Informed Consent: I have reviewed the patients History and Physical, chart, labs and discussed the procedure including the risks, benefits and alternatives for the proposed anesthesia with the patient or authorized representative who has indicated his/her understanding and acceptance.     Plan Discussed with:   Anesthesia Plan Comments:         Anesthesia Quick Evaluation

## 2017-05-16 NOTE — Discharge Instructions (Signed)
°  °        [facility] Instructions °[address]- [employer] [employer] [address]-[place] °    ° °1. Avoid closing eyes tightly. One often closes the eye tightly when laughing, talking, sneezing, coughing or if they feel irritated. At these times, you should be careful not to close your eyes tightly. ° °2. Instill eye drops as instructed. To instill drops in your eye, open it, look up and have someone gently pull the lower lid down and instill a couple of drops inside the lower lid. ° °3. Do not touch upper lid. ° °4. Take Advil or Tylenol for pain. ° °5. You may use either eye for near work, such as reading or sewing and you may watch television. ° °6. You may have your hair done at the beauty parlor at any time. ° °7. Wear dark glasses with or without your own glasses if you are in bright light. ° °8. Call our office at [phone] or [phone] if you have sharp pain in your eye or unusual symptoms. ° °9.  FOLLOW UP WITH DR. SHAPIRO TODAY IN HIS [place] OFFICE AT 3:  pm. ° °  °I have received a copy of the above instructions and will follow them.  ° ° ° °IF YOU ARE IN IMMEDIATE DANGER CALL 911! ° °It is important for you to keep your follow-up appointment with your physician after discharge, OR, for you /your caregiver to make a follow-up appointment with your physician / medical provider after discharge. ° °Show these instructions to the next healthcare provider you see. ° ° °Monitored Anesthesia Care, Care After °These instructions provide you with information about caring for yourself after your procedure. Your health care provider may also give you more specific instructions. Your treatment has been planned according to current medical practices, but problems sometimes occur. Call your health care provider if you have any problems or questions after your procedure. °What can I expect after the procedure? °After your procedure, it is common to: °· Feel sleepy for several hours. °· Feel  clumsy and have poor balance for several hours. °· Feel forgetful about what happened after the procedure. °· Have poor judgment for several hours. °· Feel nauseous or vomit. °· Have a sore throat if you had a breathing tube during the procedure. ° °Follow these instructions at home: °For at least 24 hours after the procedure: ° °· Do not: °? Participate in activities in which you could fall or become injured. °? Drive. °? Use heavy machinery. °? Drink alcohol. °? Take sleeping pills or medicines that cause drowsiness. °? Make important decisions or sign legal documents. °? Take care of children on your own. °· Rest. °Eating and drinking °· Follow the diet that is recommended by your health care provider. °· If you vomit, drink water, juice, or soup when you can drink without vomiting. °· Make sure you have little or no nausea before eating solid foods. °General instructions °· Have a responsible adult stay with you until you are awake and alert. °· Take over-the-counter and prescription medicines only as told by your health care provider. °· If you smoke, do not smoke without supervision. °· Keep all follow-up visits as told by your health care provider. This is important. °Contact a health care provider if: °· You keep feeling nauseous or you keep vomiting. °· You feel light-headed. °· You develop a rash. °· You have a fever. °Get help right away if: °· You have   trouble breathing. °This information is not intended to replace advice given to you by your health care provider. Make sure you discuss any questions you have with your health care provider. °Document Released: 09/27/2015 Document Revised: 01/27/2016 Document Reviewed: 09/27/2015 °Elsevier Interactive Patient Education © 2018 [employer] ° ° °

## 2017-05-16 NOTE — Transfer of Care (Signed)
Immediate Anesthesia Transfer of Care Note  Patient: Tammy Kramer  Procedure(s) Performed: CATARACT EXTRACTION PHACO AND INTRAOCULAR LENS PLACEMENT (IOC) (Left Eye)  Patient Location: Short Stay  Anesthesia Type:MAC  Level of Consciousness: awake, alert , oriented and patient cooperative  Airway & Oxygen Therapy: Patient Spontanous Breathing  Post-op Assessment: Report given to RN and Post -op Vital signs reviewed and stable  Post vital signs: Reviewed and stable  Last Vitals:  Vitals:   05/16/17 1010  BP: 133/76  Pulse: 79  Temp: 36.6 C    Last Pain:  Vitals:   05/16/17 1010  TempSrc: Oral      Patients Stated Pain Goal: 6 (64/33/29 5188)  Complications: No apparent anesthesia complications

## 2017-05-16 NOTE — Op Note (Signed)
Patient brought to the operating room and prepped and draped in the usual manner.  Lid speculum inserted in left eye.  Stab incision made at the twelve o'clock position.  Provisc instilled in the anterior chamber.   A 2.4 mm. Stab incision was made temporally. Due to a small pupil, a Malugyn Ring was inserted.  An anterior capsulotomy was done with a bent 25 gauge needle.  The nucleus was hydrodissected.  The Phaco tip was inserted in the anterior chamber and the nucleus was emulsified.  CDE was 3.80.  The cortical material was then removed with the I and A tip.  Posterior capsule was the polished.  The anterior chamber was deepened with Provisc.  A 21.5 Diopter Alcon [id number] IOL was then inserted in the capsular bag.  The Malugyn Ring was removed. Provisc was then removed with the I and A tip.  The wound was then hydrated.  Patient sent to the Recovery Room in good condition with follow up in my office.  Preoperative Diagnosis: Cortical and  Nuclear Cataract OS Postoperative Diagnosis:  Same Procedure name: Kelman Phacoemulsification OS with IOL

## 2017-05-17 ENCOUNTER — Encounter (HOSPITAL_COMMUNITY): Payer: Self-pay | Admitting: Ophthalmology

## 2017-06-05 ENCOUNTER — Ambulatory Visit (INDEPENDENT_AMBULATORY_CARE_PROVIDER_SITE_OTHER): Payer: Medicare Other

## 2017-06-05 ENCOUNTER — Encounter: Payer: Self-pay | Admitting: Orthopedic Surgery

## 2017-06-05 ENCOUNTER — Ambulatory Visit (INDEPENDENT_AMBULATORY_CARE_PROVIDER_SITE_OTHER): Payer: Medicare Other | Admitting: Orthopedic Surgery

## 2017-06-05 VITALS — BP 141/80 | HR 111 | Ht 64.0 in | Wt 188.0 lb

## 2017-06-05 DIAGNOSIS — M5030 Other cervical disc degeneration, unspecified cervical region: Secondary | ICD-10-CM | POA: Diagnosis not present

## 2017-06-05 DIAGNOSIS — Z0000 Encounter for general adult medical examination without abnormal findings: Secondary | ICD-10-CM | POA: Diagnosis not present

## 2017-06-05 DIAGNOSIS — M25511 Pain in right shoulder: Secondary | ICD-10-CM

## 2017-06-05 DIAGNOSIS — M542 Cervicalgia: Secondary | ICD-10-CM | POA: Diagnosis not present

## 2017-06-05 DIAGNOSIS — E785 Hyperlipidemia, unspecified: Secondary | ICD-10-CM | POA: Diagnosis not present

## 2017-06-05 DIAGNOSIS — Z1389 Encounter for screening for other disorder: Secondary | ICD-10-CM | POA: Diagnosis not present

## 2017-06-05 DIAGNOSIS — J309 Allergic rhinitis, unspecified: Secondary | ICD-10-CM | POA: Diagnosis not present

## 2017-06-05 DIAGNOSIS — I10 Essential (primary) hypertension: Secondary | ICD-10-CM | POA: Diagnosis not present

## 2017-06-05 DIAGNOSIS — K219 Gastro-esophageal reflux disease without esophagitis: Secondary | ICD-10-CM | POA: Diagnosis not present

## 2017-06-05 DIAGNOSIS — R799 Abnormal finding of blood chemistry, unspecified: Secondary | ICD-10-CM | POA: Diagnosis not present

## 2017-06-05 DIAGNOSIS — E782 Mixed hyperlipidemia: Secondary | ICD-10-CM | POA: Diagnosis not present

## 2017-06-05 NOTE — Progress Notes (Signed)
Progress Note   Patient ID: Tammy Kramer, female   DOB: 04/17/1951, 66 y.o.   MRN: [id number]  Chief Complaint  Patient presents with  . Shoulder Pain    right     66 year old female comes in for evaluation of right neck and shoulder pain.  She had a C5-6 fusion by Dr. Channing Muttersoy for ruptured disc and spinal stenosis.  She did well.  She comes in complaining of pain that starts in her neck radiates down the right side into her right elbow.  This is associated with fatigue with activities of daily living and she gets relief by abducting her arm and putting it over her head  Pain location cervical spine  Quality dull aching associated with some burning sensation  Severity moderate duration started October  Timing constant       Review of Systems  Constitutional: Negative for fever.  Musculoskeletal: Positive for neck pain.  Neurological: Negative for tingling and tremors.   Current Meds  Medication Sig  . acetaminophen (TYLENOL) 325 MG tablet Take 650 mg by mouth every 6 (six) hours as needed (for pain).  Marland Kitchen. atorvastatin (LIPITOR) 40 MG tablet Take 40 mg by mouth daily.   Marland Kitchen. BIOTIN PO Take 1 tablet by mouth daily.  Marland Kitchen. ibuprofen (ADVIL,MOTRIN) 200 MG tablet Take 400 mg by mouth every 8 (eight) hours as needed (for pain).  Marland Kitchen. lansoprazole (PREVACID) 30 MG capsule Take 30 mg by mouth daily before breakfast. 30 mins before meal  . loratadine (CLARITIN) 10 MG tablet Take 10 mg by mouth daily.  . Naphazoline HCl (CLEAR EYES OP) Place 1 drop 2 (two) times daily as needed into both eyes (dry eyes).   . traMADol (ULTRAM) 50 MG tablet Take 50 mg by mouth every 6 (six) hours as needed.  . valsartan-hydrochlorothiazide (DIOVAN-HCT) 160-12.5 MG per tablet Take 2 tablets by mouth daily.     Past Medical History:  Diagnosis Date  . Arthritis   . Back pain   . Chronic constipation   . Complication of anesthesia    difficulty waking up from anesthesia  . GERD (gastroesophageal reflux  disease)   . Hemorrhoids   . Hypercholesterolemia   . Hypertension   . Seasonal allergies      Allergies  Allergen Reactions  . Codeine Other (See Comments)    Hallucinations/dizziness/"out of it"  . Meperidine Hcl Other (See Comments)    Dizziness/headaches/hallucinations    BP (!) 141/80   Pulse (!) 111   Ht 5\' 4"  (1.626 m)   Wt 188 lb (85.3 kg)   BMI 32.27 kg/m    Physical Exam  Constitutional: She is oriented to person, place, and time. She appears well-developed and well-nourished.  Neurological: She is alert and oriented to person, place, and time.  Psychiatric: She has a normal mood and affect. Judgment normal.  Vitals reviewed.   Ortho Exam Shoulder exam is unremarkable.  She has full forward abduction and elevation against resistance with normal range of motion except for internal rotation which is chronic from her rotator cuff surgery.  Her shoulder is stable she has excellent strength in abduction and external rotation the skin is warm dry and intact she has no lymphadenopathy pulses are excellent sensation is normal she has 2+ reflexes at the elbow and forearm bilaterally  Her cervical spine shows decreased range of motion in turning right to left she has a negative Spurling sign but pain with lateral flexion to the left and rotation to the  left.    Medical decision-making  Imaging: Shoulder x-ray shows mild glenohumeral arthritis chronic  She had a CT scan in May 2018 which showed C5-C6 fusion with disease above and below and mild anterolisthesis in the C7-T1 region  Encounter Diagnoses  Name Primary?  . Acute pain of right shoulder   . Neck pain Yes  . DDD (degenerative disc disease), cervical     Recommend fu with neurosurgery the shoulder checks out fine    Fuller CanadaStanley Alanea Woolridge, MD 06/05/2017 10:30 AM

## 2017-07-05 ENCOUNTER — Ambulatory Visit (INDEPENDENT_AMBULATORY_CARE_PROVIDER_SITE_OTHER): Payer: Medicare Other | Admitting: Obstetrics and Gynecology

## 2017-07-05 ENCOUNTER — Encounter: Payer: Self-pay | Admitting: Obstetrics and Gynecology

## 2017-07-05 ENCOUNTER — Other Ambulatory Visit: Payer: Self-pay

## 2017-07-05 VITALS — BP 140/82 | HR 96 | Ht 64.5 in | Wt 190.0 lb

## 2017-07-05 DIAGNOSIS — R1031 Right lower quadrant pain: Secondary | ICD-10-CM

## 2017-07-05 NOTE — Progress Notes (Signed)
Patient ID: Tammy NumbersNannie S Kramer, female   DOB: 01/07/1951, 67 y.o.   MRN: [id number]   [facility]  @DATE @            Patient name: Tammy Kramer MRN [id number]  Date of birth: 02/14/1951  CC & HPI:  Tammy Kramer is a 67 y.o. female presenting today for intermittent pelvic pain that started in December 2018. She is currently not in any pain, but notes that the last episode lasted for about 3 days. When she was experiencing the pain she did not find anything unusual and could not figure why she was hurting. She denies fever, chills, or any other symptoms or complaints at this time.  All of the time of her last pain episode but no appointments were readily available therefore she has the appointment today  ROS:  ROS +pelvic pain -fever -chills All systems are negative except as noted in the HPI and PMH.   Pertinent History Reviewed:   Reviewed:  Medical         Past Medical History:  Diagnosis Date  . Arthritis   . Back pain   . Chronic constipation   . Complication of anesthesia    difficulty waking up from anesthesia  . GERD (gastroesophageal reflux disease)   . Hemorrhoids   . Hypercholesterolemia   . Hypertension   . Seasonal allergies                               Surgical Hx:    Past Surgical History:  Procedure Laterality Date  . ABDOMINAL HYSTERECTOMY    . BACK SURGERY    . C4-C5 fusion    . CATARACT EXTRACTION W/PHACO Right 05/02/2017   Procedure: CATARACT EXTRACTION PHACO AND INTRAOCULAR LENS PLACEMENT (IOC);  Surgeon: Jethro BolusShapiro, Mark, MD;  Location: [facility];  Service: Ophthalmology;  Laterality: Right;  CDE: 5.99  . CATARACT EXTRACTION W/PHACO Left 05/16/2017   Procedure: CATARACT EXTRACTION PHACO AND INTRAOCULAR LENS PLACEMENT (IOC);  Surgeon: Jethro BolusShapiro, Mark, MD;  Location: [facility];  Service: Ophthalmology;  Laterality: Left;  CDE: 3.80  . cholecystectomy    . CHOLECYSTECTOMY    . COLONOSCOPY   10/16/2006   NUR: 3 mm polyp ablated via cold biopsy from  the splenic flexure.External hemorrhoids, hyperplastic polyp  . COLONOSCOPY  Feb 2011   Dr. Jena Gaussourk: internal hemorrhoids, otherwise normal.   . COLONOSCOPY N/A 04/08/2014   RMR: Internal hemorrhoids; colonic diverticulosis( few  as described above) hematochezia likely secondary to hemorrhoids in the setting of constipation.  . COLONOSCOPY N/A 12/08/2016   Procedure: COLONOSCOPY;  Surgeon: Corbin Adeourk, Robert M, MD;  Location: [facility];  Service: Endoscopy;  Laterality: N/A;  8:15am  . DILATION AND CURETTAGE OF UTERUS     multiple  . ESOPHAGOGASTRODUODENOSCOPY  06/14/2006   NUR: Normal esophagogastroduodenoscopy  . ESOPHAGOGASTRODUODENOSCOPY  Feb 2011   Dr. Jena Gaussourk: inflamed-appearing esophagus with overlying distal esophageal erosions superimposed on noncritical Schatzki ring, s/p dilation and biopsy. Benign path  . ESOPHAGOGASTRODUODENOSCOPY N/A 12/08/2016   Procedure: ESOPHAGOGASTRODUODENOSCOPY (EGD);  Surgeon: Corbin Adeourk, Robert M, MD;  Location: [facility];  Service: Endoscopy;  Laterality: N/A;  . foot surgery     X 5-bone spurs and tendon release; morton's neuroma.  Marland Kitchen. HAND SURGERY Right    X 2-carpal tunnel release and ganglion cyst removed.  Marland Kitchen. HEMORRHOID BANDING  2016   Dr.Rourk  . KNEE ARTHROSCOPY WITH LATERAL MENISECTOMY  Left 03/27/2015   Procedure: KNEE ARTHROSCOPY WITH LATERAL MENISECTOMY;  Surgeon: Vickki Hearing, MD;  Location: [facility];  Service: Orthopedics;  Laterality: Left;  . ROTATOR CUFF REPAIR Right    Medications: Reviewed & Updated - see associated section                       Current Outpatient Medications:  .  atorvastatin (LIPITOR) 40 MG tablet, Take 40 mg by mouth daily. , Disp: , Rfl:  .  ibuprofen (ADVIL,MOTRIN) 200 MG tablet, Take 400 mg by mouth every 8 (eight) hours as needed (for pain)., Disp: , Rfl:  .  lansoprazole (PREVACID) 30 MG capsule, Take 30 mg by mouth daily before breakfast. 30 mins before meal, Disp: , Rfl:  .  loratadine (CLARITIN) 10 MG tablet,  Take 10 mg by mouth daily., Disp: , Rfl:  .  Naphazoline HCl (CLEAR EYES OP), Place 1 drop 2 (two) times daily as needed into both eyes (dry eyes). , Disp: , Rfl:  .  valsartan-hydrochlorothiazide (DIOVAN-HCT) 160-12.5 MG per tablet, Take 2 tablets by mouth daily. , Disp: , Rfl:  .  BIOTIN PO, Take 1 tablet by mouth daily., Disp: , Rfl:  .  traMADol (ULTRAM) 50 MG tablet, Take 50 mg by mouth every 6 (six) hours as needed., Disp: , Rfl:    Social History: Reviewed -  reports that  has never smoked. she has never used smokeless tobacco.  Objective Findings:  Vitals: Blood pressure 140/82, pulse 96, height 5' 4.5" (1.638 m), weight 190 lb (86.2 kg).  PHYSICAL EXAMINATION General appearance - alert, well appearing, and in no distress, oriented to person, place, and time and overweight Mental status - alert, oriented to person, place, and time, normal mood, behavior, speech, dress, motor activity, and thought processes, affect appropriate to mood  PELVIC DEFERRED  Assessment & Plan:   A:  1. RLQ/ pubic pain, resolved  P:  1. Need to be seen when she is experiencing the pain 2. Pt will call for a same day or next day appointment      By signing my name below, I, Diona Browner, attest that this documentation has been prepared under the direction and in the presence of Tilda Burrow, MD. Electronically Signed: Diona Browner, Medical Scribe. 07/05/17. 3:40 PM.  I personally performed the services described in this documentation, which was SCRIBED in my presence. The recorded information has been reviewed and considered accurate. It has been edited as necessary during review. Tilda Burrow, MD

## 2017-07-13 ENCOUNTER — Telehealth: Payer: Self-pay | Admitting: Internal Medicine

## 2017-07-13 NOTE — Telephone Encounter (Signed)
[phone] PLEASE CALL PATIENT ABOUT HER PRESCRIPTION.  HER PHARMACY IS SUPPOSED TO BE FAXING US INFORMATION ON CHANGING IT DUE TO HER INSURANCE

## 2017-07-13 NOTE — Telephone Encounter (Signed)
Received approval of Lansoprazole 06/20/2017-07/13/2018. Letter will be scanned in chart.

## 2017-07-13 NOTE — Telephone Encounter (Signed)
Spoke with pt and [facility]. Lannsoprazole 30mg  needs a PA. Will start pt.

## 2017-07-27 DIAGNOSIS — I10 Essential (primary) hypertension: Secondary | ICD-10-CM | POA: Diagnosis not present

## 2017-07-27 DIAGNOSIS — M549 Dorsalgia, unspecified: Secondary | ICD-10-CM | POA: Diagnosis not present

## 2017-07-27 DIAGNOSIS — N390 Urinary tract infection, site not specified: Secondary | ICD-10-CM | POA: Diagnosis not present

## 2017-08-10 ENCOUNTER — Encounter: Payer: Self-pay | Admitting: Gastroenterology

## 2017-08-10 ENCOUNTER — Ambulatory Visit (INDEPENDENT_AMBULATORY_CARE_PROVIDER_SITE_OTHER): Payer: Medicare Other | Admitting: Gastroenterology

## 2017-08-10 VITALS — BP 130/74 | HR 87 | Temp 97.0°F | Ht 65.0 in | Wt 188.0 lb

## 2017-08-10 DIAGNOSIS — K641 Second degree hemorrhoids: Secondary | ICD-10-CM

## 2017-08-10 DIAGNOSIS — K5900 Constipation, unspecified: Secondary | ICD-10-CM

## 2017-08-10 MED ORDER — HYDROCORTISONE 2.5 % RE CREA: 1.0000 "application " | TOPICAL_CREAM | Freq: Two times a day (BID) | RECTAL | 0 refills | Status: DC

## 2017-08-10 MED ORDER — LUBIPROSTONE 24 MCG PO CAPS: 24.0000 ug | ORAL_CAPSULE | Freq: Two times a day (BID) | ORAL | 5 refills | Status: DC

## 2017-08-10 NOTE — Assessment & Plan Note (Signed)
Increasing difficulty to manage her constipation. She is using tramadol contributing to her constipation issues. Abdominal exam benign. Recent colonoscopy reassuring.  Start amitizar 24 g twice daily. Samples and prescription provided. If she needs more immediate relief she could try suppository or enema today. If she doesn't note significant improvement in her symptoms she is to notify me and I would consider further workup via imaging. She is having some issues with her hemorrhoids lately and the same constipation. rx for anusol cream bid anorectally.   Office visit in two to three weeks to see how she is doing. If she is significantly improved she can cancel the appointment
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
## 2017-08-10 NOTE — Patient Instructions (Signed)
1. Start Amitiza twice daily with food.  2. Start anusol cream apply inside the rectum twice daily for two weeks.  3. You may use glycerin suppository or fleet enema to get bowels started if needed.  4. Call if you do not get results or your symptoms worsen. At that time would consider xray. Return to office in 2-3 weeks for follow up. If you are doing a lot better you can cancel the appointment.

## 2017-08-11 NOTE — Progress Notes (Signed)
CC'D TO PCP °

## 2017-08-22 DIAGNOSIS — B351 Tinea unguium: Secondary | ICD-10-CM | POA: Diagnosis not present

## 2017-08-28 DIAGNOSIS — E785 Hyperlipidemia, unspecified: Secondary | ICD-10-CM | POA: Diagnosis not present

## 2017-08-28 DIAGNOSIS — K219 Gastro-esophageal reflux disease without esophagitis: Secondary | ICD-10-CM | POA: Diagnosis not present

## 2017-08-28 DIAGNOSIS — I10 Essential (primary) hypertension: Secondary | ICD-10-CM | POA: Diagnosis not present

## 2017-08-28 DIAGNOSIS — J309 Allergic rhinitis, unspecified: Secondary | ICD-10-CM | POA: Diagnosis not present

## 2017-08-31 ENCOUNTER — Ambulatory Visit: Payer: Medicare Other | Admitting: Nurse Practitioner

## 2017-10-09 DIAGNOSIS — Z961 Presence of intraocular lens: Secondary | ICD-10-CM | POA: Diagnosis not present

## 2017-10-25 ENCOUNTER — Telehealth: Payer: Self-pay | Admitting: Nurse Practitioner

## 2017-10-25 ENCOUNTER — Ambulatory Visit: Payer: Medicare Other | Admitting: Nurse Practitioner

## 2017-10-25 ENCOUNTER — Encounter: Payer: Self-pay | Admitting: Nurse Practitioner

## 2017-10-25 NOTE — Progress Notes (Deleted)
Referring Provider: Avon Gully, MD Primary Care Physician:  Avon Gully, MD Primary GI:  Dr. Jena Gauss  No chief complaint on file.   HPI:   Tammy Kramer is a 67 y.o. female who presents for follow-up on constipation.  The patient was last seen in our office 08/10/2017 for hemorrhoids and constipation.  History of constipation, reflux, esophagitis.  EGD and colonoscopy up-to-date 12/08/2016 with LA grade a esophagitis and mild Schatzki's ring status post dilation, internal hemorrhoids.  At her last visit reflux is improved, complaining of constipation which is been difficult to manage over the previous few weeks.  Noted rectal pressure and stool will not come out.  Lower back pressure as well.  Some bright red blood per rectum with constipation and straining.  Takes tramadol as needed.  Almost proceeded to the emergency department over the previous weekend due to her constipation.  Took multiple over-the-counter laxatives and an old Amitiza and finally had a very small stool.  At her last visit, noted previous bowel movement 2 days prior.  Lower abdominal pain which subsides after bowel movement.  Recommended Amitiza 24 mcg twice daily, Anusol rectal cream, glycerin suppository or fleets enema as needed, call if any worsening symptoms to consider x-ray.  Follow-up in 2 to 3 weeks.  Today she states   Past Medical History:  Diagnosis Date  . Arthritis   . Back pain   . Chronic constipation   . Complication of anesthesia    difficulty waking up from anesthesia  . GERD (gastroesophageal reflux disease)   . Hemorrhoids   . Hypercholesterolemia   . Hypertension   . Seasonal allergies     Past Surgical History:  Procedure Laterality Date  . ABDOMINAL HYSTERECTOMY    . BACK SURGERY    . C4-C5 fusion    . CATARACT EXTRACTION W/PHACO Right 05/02/2017   Procedure: CATARACT EXTRACTION PHACO AND INTRAOCULAR LENS PLACEMENT (IOC);  Surgeon: Jethro Bolus, MD;  Location: [facility];  Service:  Ophthalmology;  Laterality: Right;  CDE: 5.99  . CATARACT EXTRACTION W/PHACO Left 05/16/2017   Procedure: CATARACT EXTRACTION PHACO AND INTRAOCULAR LENS PLACEMENT (IOC);  Surgeon: Jethro Bolus, MD;  Location: [facility];  Service: Ophthalmology;  Laterality: Left;  CDE: 3.80  . cholecystectomy    . CHOLECYSTECTOMY    . COLONOSCOPY   10/16/2006   NUR: 3 mm polyp ablated via cold biopsy from the splenic flexure.External hemorrhoids, hyperplastic polyp  . COLONOSCOPY  Feb 2011   Dr. Jena Gauss: internal hemorrhoids, otherwise normal.   . COLONOSCOPY N/A 04/08/2014   RMR: Internal hemorrhoids; colonic diverticulosis( few  as described above) hematochezia likely secondary to hemorrhoids in the setting of constipation.  . COLONOSCOPY N/A 12/08/2016   Dr. Jena Gauss: none bleeding internal hemorrhoids which were small. Next colonoscopy in five years.  Marland Kitchen DILATION AND CURETTAGE OF UTERUS     multiple  . ESOPHAGOGASTRODUODENOSCOPY  06/14/2006   NUR: Normal esophagogastroduodenoscopy  . ESOPHAGOGASTRODUODENOSCOPY  Feb 2011   Dr. Jena Gauss: inflamed-appearing esophagus with overlying distal esophageal erosions superimposed on noncritical Schatzki ring, s/p dilation and biopsy. Benign path  . ESOPHAGOGASTRODUODENOSCOPY N/A 12/08/2016   Dr. Jena Gauss: LA grade A esophagitis, mild schatzki's ring at the GE junction, small hiatal hernia, esophageal dilation performed. Prevacid increased to BID.  . foot surgery     X 5-bone spurs and tendon release; morton's neuroma.  Marland Kitchen HAND SURGERY Right    X 2-carpal tunnel release and ganglion cyst removed.  Marland Kitchen HEMORRHOID BANDING  2016  Dr.Rourk  . KNEE ARTHROSCOPY WITH LATERAL MENISECTOMY Left 03/27/2015   Procedure: KNEE ARTHROSCOPY WITH LATERAL MENISECTOMY;  Surgeon: Vickki Hearing, MD;  Location: [facility];  Service: Orthopedics;  Laterality: Left;  . ROTATOR CUFF REPAIR Right     Current Outpatient Medications  Medication Sig Dispense Refill  . atorvastatin (LIPITOR) 40 MG tablet  Take 40 mg by mouth daily.     . hydrocortisone (ANUSOL-HC) 2.5 % rectal cream Place 1 application rectally 2 (two) times daily. 30 g 0  . ibuprofen (ADVIL,MOTRIN) 200 MG tablet Take 400 mg by mouth every 8 (eight) hours as needed (for pain).    Marland Kitchen lansoprazole (PREVACID) 30 MG capsule Take 30 mg by mouth daily before breakfast. 30 mins before meal    . loratadine (CLARITIN) 10 MG tablet Take 10 mg by mouth daily.    Marland Kitchen lubiprostone (AMITIZA) 24 MCG capsule Take 1 capsule (24 mcg total) by mouth 2 (two) times daily with a meal. 60 capsule 5  . Naphazoline HCl (CLEAR EYES OP) Place 1 drop 2 (two) times daily as needed into both eyes (dry eyes).     . traMADol (ULTRAM) 50 MG tablet Take 50 mg by mouth every 6 (six) hours as needed.    . valsartan-hydrochlorothiazide (DIOVAN-HCT) 160-12.5 MG per tablet Take 2 tablets by mouth daily.      No current facility-administered medications for this visit.     Allergies as of 10/25/2017 - Review Complete 08/10/2017  Allergen Reaction Noted  . Codeine Other (See Comments)   . Meperidine hcl Other (See Comments)     Family History  Problem Relation Age of Onset  . Hypertension Mother   . Aneurysm Mother   . Diabetes Mother   . Heart disease Father   . Heart disease Son   . Colon cancer Brother        age 98, deceased 2 weeks later    Social History   Socioeconomic History  . Marital status: Divorced    Spouse name: Not on file  . Number of children: Not on file  . Years of education: Not on file  . Highest education level: Not on file  Occupational History  . Not on file  Social Needs  . Financial resource strain: Not on file  . Food insecurity:    Worry: Not on file    Inability: Not on file  . Transportation needs:    Medical: Not on file    Non-medical: Not on file  Tobacco Use  . Smoking status: Never Smoker  . Smokeless tobacco: Never Used  . Tobacco comment: Never smoked  Substance and Sexual Activity  . Alcohol use: No  .  Drug use: No  . Sexual activity: Yes    Birth control/protection: Surgical    Comment: hyst  Lifestyle  . Physical activity:    Days per week: Not on file    Minutes per session: Not on file  . Stress: Not on file  Relationships  . Social connections:    Talks on phone: Not on file    Gets together: Not on file    Attends religious service: Not on file    Active member of club or organization: Not on file    Attends meetings of clubs or organizations: Not on file    Relationship status: Not on file  Other Topics Concern  . Not on file  Social History Narrative  . Not on file    Review of Systems:  General: Negative for anorexia, weight loss, fever, chills, fatigue, weakness. Eyes: Negative for vision changes.  ENT: Negative for hoarseness, difficulty swallowing , nasal congestion. CV: Negative for chest pain, angina, palpitations, dyspnea on exertion, peripheral edema.  Respiratory: Negative for dyspnea at rest, dyspnea on exertion, cough, sputum, wheezing.  GI: See history of present illness. GU:  Negative for dysuria, hematuria, urinary incontinence, urinary frequency, nocturnal urination.  MS: Negative for joint pain, low back pain.  Derm: Negative for rash or itching.  Neuro: Negative for weakness, abnormal sensation, seizure, frequent headaches, memory loss, confusion.  Psych: Negative for anxiety, depression, suicidal ideation, hallucinations.  Endo: Negative for unusual weight change.  Heme: Negative for bruising or bleeding. Allergy: Negative for rash or hives.   Physical Exam: There were no vitals taken for this visit. General:   Alert and oriented. Pleasant and cooperative. Well-nourished and well-developed.  Head:  Normocephalic and atraumatic. Eyes:  Without icterus, sclera clear and conjunctiva pink.  Ears:  Normal auditory acuity. Mouth:  No deformity or lesions, oral mucosa pink.  Throat/Neck:  Supple, without mass or thyromegaly. Cardiovascular:  S1, S2  present without murmurs appreciated. Normal pulses noted. Extremities without clubbing or edema. Respiratory:  Clear to auscultation bilaterally. No wheezes, rales, or rhonchi. No distress.  Gastrointestinal:  +BS, soft, non-tender and non-distended. No HSM noted. No guarding or rebound. No masses appreciated.  Rectal:  Deferred  Musculoskalatal:  Symmetrical without gross deformities. Normal posture. Skin:  Intact without significant lesions or rashes. Neurologic:  Alert and oriented x4;  grossly normal neurologically. Psych:  Alert and cooperative. Normal mood and affect. Heme/Lymph/Immune: No significant cervical adenopathy. No excessive bruising noted.    10/25/2017 1:19 PM   Disclaimer: This note was dictated with voice recognition software. Similar sounding words can inadvertently be transcribed and may not be corrected upon review.

## 2017-10-25 NOTE — Telephone Encounter (Signed)
PATIENT WAS A NO SHOW AND LETTER SENT  °

## 2017-10-30 NOTE — Telephone Encounter (Signed)
Noted  

## 2017-11-10 DIAGNOSIS — K219 Gastro-esophageal reflux disease without esophagitis: Secondary | ICD-10-CM | POA: Diagnosis not present

## 2017-11-10 DIAGNOSIS — I10 Essential (primary) hypertension: Secondary | ICD-10-CM | POA: Diagnosis not present

## 2017-11-10 DIAGNOSIS — E785 Hyperlipidemia, unspecified: Secondary | ICD-10-CM | POA: Diagnosis not present

## 2017-11-10 DIAGNOSIS — K649 Unspecified hemorrhoids: Secondary | ICD-10-CM | POA: Diagnosis not present

## 2017-11-14 ENCOUNTER — Other Ambulatory Visit: Payer: Self-pay | Admitting: Gastroenterology

## 2017-11-21 DIAGNOSIS — B351 Tinea unguium: Secondary | ICD-10-CM | POA: Diagnosis not present

## 2017-12-12 DIAGNOSIS — I10 Essential (primary) hypertension: Secondary | ICD-10-CM | POA: Diagnosis not present

## 2017-12-12 DIAGNOSIS — J019 Acute sinusitis, unspecified: Secondary | ICD-10-CM | POA: Diagnosis not present

## 2017-12-12 DIAGNOSIS — K219 Gastro-esophageal reflux disease without esophagitis: Secondary | ICD-10-CM | POA: Diagnosis not present

## 2017-12-12 DIAGNOSIS — E785 Hyperlipidemia, unspecified: Secondary | ICD-10-CM | POA: Diagnosis not present

## 2018-01-30 ENCOUNTER — Other Ambulatory Visit (HOSPITAL_COMMUNITY): Payer: Self-pay | Admitting: Internal Medicine

## 2018-01-30 DIAGNOSIS — Z1231 Encounter for screening mammogram for malignant neoplasm of breast: Secondary | ICD-10-CM

## 2018-02-09 ENCOUNTER — Ambulatory Visit (HOSPITAL_COMMUNITY)
Admission: RE | Admit: 2018-02-09 | Discharge: 2018-02-09 | Disposition: A | Discharge status: Home / Self Care | Payer: Medicare Other | Source: Ambulatory Visit | Attending: Internal Medicine | Admitting: Internal Medicine

## 2018-02-09 DIAGNOSIS — Z1231 Encounter for screening mammogram for malignant neoplasm of breast: Secondary | ICD-10-CM | POA: Insufficient documentation

## 2018-02-16 DIAGNOSIS — M7741 Metatarsalgia, right foot: Secondary | ICD-10-CM | POA: Diagnosis not present

## 2018-02-16 DIAGNOSIS — M1990 Unspecified osteoarthritis, unspecified site: Secondary | ICD-10-CM | POA: Diagnosis not present

## 2018-02-16 DIAGNOSIS — I10 Essential (primary) hypertension: Secondary | ICD-10-CM | POA: Diagnosis not present

## 2018-02-16 DIAGNOSIS — E785 Hyperlipidemia, unspecified: Secondary | ICD-10-CM | POA: Diagnosis not present

## 2018-02-16 DIAGNOSIS — K219 Gastro-esophageal reflux disease without esophagitis: Secondary | ICD-10-CM | POA: Diagnosis not present

## 2018-02-16 DIAGNOSIS — M2040 Other hammer toe(s) (acquired), unspecified foot: Secondary | ICD-10-CM | POA: Diagnosis not present

## 2018-03-27 DIAGNOSIS — Z23 Encounter for immunization: Secondary | ICD-10-CM | POA: Diagnosis not present

## 2018-03-30 DIAGNOSIS — L600 Ingrowing nail: Secondary | ICD-10-CM | POA: Diagnosis not present

## 2018-03-30 DIAGNOSIS — M79675 Pain in left toe(s): Secondary | ICD-10-CM | POA: Diagnosis not present

## 2018-04-12 DIAGNOSIS — N390 Urinary tract infection, site not specified: Secondary | ICD-10-CM | POA: Diagnosis not present

## 2018-04-12 DIAGNOSIS — R300 Dysuria: Secondary | ICD-10-CM | POA: Diagnosis not present

## 2018-04-13 DIAGNOSIS — R300 Dysuria: Secondary | ICD-10-CM | POA: Diagnosis not present

## 2018-04-27 DIAGNOSIS — Z4889 Encounter for other specified surgical aftercare: Secondary | ICD-10-CM | POA: Diagnosis not present

## 2018-05-24 ENCOUNTER — Ambulatory Visit (INDEPENDENT_AMBULATORY_CARE_PROVIDER_SITE_OTHER): Payer: Medicare Other | Admitting: Gastroenterology

## 2018-05-24 ENCOUNTER — Encounter: Payer: Self-pay | Admitting: *Deleted

## 2018-05-24 ENCOUNTER — Encounter: Payer: Self-pay | Admitting: Gastroenterology

## 2018-05-24 VITALS — BP 120/64 | HR 89 | Temp 97.2°F | Ht 65.0 in | Wt 166.6 lb

## 2018-05-24 DIAGNOSIS — R634 Abnormal weight loss: Secondary | ICD-10-CM

## 2018-05-24 DIAGNOSIS — K5900 Constipation, unspecified: Secondary | ICD-10-CM | POA: Diagnosis not present

## 2018-05-24 DIAGNOSIS — K642 Third degree hemorrhoids: Secondary | ICD-10-CM | POA: Diagnosis not present

## 2018-05-24 NOTE — Addendum Note (Signed)
Addended by: Gelene MinkBOONE, Zamyah Wiesman W on: 05/24/2018 11:14 AM   Modules accepted: Orders

## 2018-05-24 NOTE — Assessment & Plan Note (Signed)
Decrease Amitiza to 8 mcg BID due to diarrhea with higher dosing.

## 2018-05-24 NOTE — Patient Instructions (Signed)
I would like for you to try the Amitiza 8 microgram capsule twice a day with food (make sure to take with food to avoid nausea). Let me know how this does for you!  I have ordered a CT scan of your abdomen and pelvis.  I do think you would be a good candidate for hemorrhoid banding after we make sure nothing else is going on.  Further recommendations to follow!   It was a pleasure to see you today. I strive to create trusting relationships with patients to provide genuine, compassionate, and quality care. I value your feedback. If you receive a survey regarding your visit,  I greatly appreciate you taking time to fill this out.   Gelene MinkAnna W. Laster Appling, PhD, ANP-BC [facility]

## 2018-05-24 NOTE — Progress Notes (Signed)
Referring Provider: Avon Gully, MD Primary Care Physician:  Avon Gully, MD Primary GI: Dr. Jena Gauss   Chief Complaint  Patient presents with  . rectal pressure    feels like something is blocking bm from coming out  . Weight Loss    lost approx 20 lbs since August (not trying)    HPI:   Tammy Kramer is a 67 y.o. female presenting today with a history of GERD and constipation. Colonoscopy and EGD on file from June 2018. Known history of hemorrhoids.   Feb 2019 was 188. Now 166.   Has pressure in rectum. Feels like she can't empty all at one time. Stomach feels full in lower abdomen. Just small amounts of stool. Was on Amitiza but and felt she was having too many bowel movements. Stool color is reddish. Feels like something coming across her rectum. Denies any prolapsing. Denies any pain with BM.   Sometimes stool empties all at one time like a laxative. Eats an egg for breakfast. Watching intake in evening to avoid reflux exacerbations.   Past Medical History:  Diagnosis Date  . Arthritis   . Back pain   . Chronic constipation   . Complication of anesthesia    difficulty waking up from anesthesia  . GERD (gastroesophageal reflux disease)   . Hemorrhoids   . Hypercholesterolemia   . Hypertension   . Seasonal allergies     Past Surgical History:  Procedure Laterality Date  . ABDOMINAL HYSTERECTOMY    . BACK SURGERY    . C4-C5 fusion    . CATARACT EXTRACTION W/PHACO Right 05/02/2017   Procedure: CATARACT EXTRACTION PHACO AND INTRAOCULAR LENS PLACEMENT (IOC);  Surgeon: Jethro Bolus, MD;  Location: [facility];  Service: Ophthalmology;  Laterality: Right;  CDE: 5.99  . CATARACT EXTRACTION W/PHACO Left 05/16/2017   Procedure: CATARACT EXTRACTION PHACO AND INTRAOCULAR LENS PLACEMENT (IOC);  Surgeon: Jethro Bolus, MD;  Location: [facility];  Service: Ophthalmology;  Laterality: Left;  CDE: 3.80  . cholecystectomy    . CHOLECYSTECTOMY    . COLONOSCOPY   10/16/2006   NUR: 3  mm polyp ablated via cold biopsy from the splenic flexure.External hemorrhoids, hyperplastic polyp  . COLONOSCOPY  Feb 2011   Dr. Jena Gauss: internal hemorrhoids, otherwise normal.   . COLONOSCOPY N/A 04/08/2014   RMR: Internal hemorrhoids; colonic diverticulosis( few  as described above) hematochezia likely secondary to hemorrhoids in the setting of constipation.  . COLONOSCOPY N/A 12/08/2016   Dr. Jena Gauss: none bleeding internal hemorrhoids which were small. Next colonoscopy in five years.  Marland Kitchen DILATION AND CURETTAGE OF UTERUS     multiple  . ESOPHAGOGASTRODUODENOSCOPY  06/14/2006   NUR: Normal esophagogastroduodenoscopy  . ESOPHAGOGASTRODUODENOSCOPY  Feb 2011   Dr. Jena Gauss: inflamed-appearing esophagus with overlying distal esophageal erosions superimposed on noncritical Schatzki ring, s/p dilation and biopsy. Benign path  . ESOPHAGOGASTRODUODENOSCOPY N/A 12/08/2016   Dr. Jena Gauss: LA grade A esophagitis, mild schatzki's ring at the GE junction, small hiatal hernia, esophageal dilation performed. Prevacid increased to BID.  . foot surgery     X 5-bone spurs and tendon release; morton's neuroma.  Marland Kitchen HAND SURGERY Right    X 2-carpal tunnel release and ganglion cyst removed.  Marland Kitchen HEMORRHOID BANDING  2016   Dr.Rourk  . KNEE ARTHROSCOPY WITH LATERAL MENISECTOMY Left 03/27/2015   Procedure: KNEE ARTHROSCOPY WITH LATERAL MENISECTOMY;  Surgeon: Vickki Hearing, MD;  Location: [facility];  Service: Orthopedics;  Laterality: Left;  . ROTATOR CUFF REPAIR Right  Current Outpatient Medications  Medication Sig Dispense Refill  . atorvastatin (LIPITOR) 40 MG tablet Take 40 mg by mouth daily.     Marland Kitchen. ibuprofen (ADVIL,MOTRIN) 200 MG tablet Take 400 mg by mouth every 8 (eight) hours as needed (for pain).    Marland Kitchen. lansoprazole (PREVACID) 30 MG capsule Take 30 mg by mouth as needed. 30 mins before meal    . loratadine (CLARITIN) 10 MG tablet Take 10 mg by mouth daily.    . Naphazoline HCl (CLEAR EYES OP) Place 1 drop 2  (two) times daily as needed into both eyes (dry eyes).     Marland Kitchen. PROCTOZONE-HC 2.5 % rectal cream USE RECTALLY TWICE DAILY (Patient taking differently: as needed. ) 30 g 0  . traMADol (ULTRAM) 50 MG tablet Take 50 mg by mouth every 6 (six) hours as needed.    . valsartan-hydrochlorothiazide (DIOVAN-HCT) 160-12.5 MG per tablet Take 2 tablets by mouth daily.     Marland Kitchen. lubiprostone (AMITIZA) 24 MCG capsule Take 1 capsule (24 mcg total) by mouth 2 (two) times daily with a meal. (Patient not taking: Reported on 05/24/2018) 60 capsule 5   No current facility-administered medications for this visit.     Allergies as of 05/24/2018 - Review Complete 05/24/2018  Allergen Reaction Noted  . Codeine Other (See Comments)   . Meperidine hcl Other (See Comments)     Family History  Problem Relation Age of Onset  . Hypertension Mother   . Aneurysm Mother   . Diabetes Mother   . Heart disease Father   . Heart disease Son   . Colon cancer Brother        age 68, deceased 2 weeks later    Social History   Socioeconomic History  . Marital status: Divorced    Spouse name: Not on file  . Number of children: Not on file  . Years of education: Not on file  . Highest education level: Not on file  Occupational History  . Not on file  Social Needs  . Financial resource strain: Not on file  . Food insecurity:    Worry: Not on file    Inability: Not on file  . Transportation needs:    Medical: Not on file    Non-medical: Not on file  Tobacco Use  . Smoking status: Never Smoker  . Smokeless tobacco: Never Used  . Tobacco comment: Never smoked  Substance and Sexual Activity  . Alcohol use: No  . Drug use: No  . Sexual activity: Yes    Birth control/protection: Surgical    Comment: hyst  Lifestyle  . Physical activity:    Days per week: Not on file    Minutes per session: Not on file  . Stress: Not on file  Relationships  . Social connections:    Talks on phone: Not on file    Gets together: Not  on file    Attends religious service: Not on file    Active member of club or organization: Not on file    Attends meetings of clubs or organizations: Not on file    Relationship status: Not on file  Other Topics Concern  . Not on file  Social History Narrative  . Not on file    Review of Systems: Gen: see HPI  CV: Denies chest pain, palpitations, syncope, peripheral edema, and claudication. Resp: Denies dyspnea at rest, cough, wheezing, coughing up blood, and pleurisy. GI: see HPI  Derm: Denies rash, itching, dry skin Psych: Denies  depression, anxiety, memory loss, confusion. No homicidal or suicidal ideation.  Heme: Denies bruising, bleeding, and enlarged lymph nodes.  Physical Exam: BP 120/64   Pulse 89   Temp (!) 97.2 F (36.2 C) (Oral)   Ht 5\' 5"  (1.651 m)   Wt 166 lb 9.6 oz (75.6 kg)   BMI 27.72 kg/m  General:   Alert and oriented. No distress noted. Pleasant and cooperative.  Head:  Normocephalic and atraumatic. Eyes:  Conjuctiva clear without scleral icterus. Mouth:  Oral mucosa pink and moist.  Abdomen:  +BS, soft, mild discomfort lower abdomen and non-distended. No rebound or guarding. No HSM or masses noted. Rectal: external hemorrhoid tags, most pronounced left lateral. Anoscopy performed without mass. Internal hemorrhoids noted, most pronounced left lateral position.  Msk:  Symmetrical without gross deformities. Normal posture. Extremities:  Without edema. Neurologic:  Alert and  oriented x4 Psych:  Alert and cooperative. Normal mood and affect.

## 2018-05-24 NOTE — Assessment & Plan Note (Signed)
Known internal hemorrhoids on colonoscopy last year. Sensation of pressure in rectum, with unproductive stools. Anoscopy performed today without mass and notable internal hemorrhoids most pronounced left lateral position. Will maximize constipation management but avoid higher dosing of Amitiza as this cause more diarrhea. Start Amitiza 8 mcg po BID with food. Samples provided. Due to abnormal weight loss, also obtaining CT abd/pelvis with contrast. Consider hemorrhoid banding if CT unrevealing. Colonoscopy/EGD on file.

## 2018-05-24 NOTE — Assessment & Plan Note (Signed)
Documented 20 lbs weight loss over past several months. 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 However, need to rule out occult malignancy. Colonoscopy/EGD on file from June 2018. CT abd/pelvis with contrast ordered.

## 2018-05-24 NOTE — Progress Notes (Signed)
CC'D TO PCP °

## 2018-05-25 DIAGNOSIS — Z0000 Encounter for general adult medical examination without abnormal findings: Secondary | ICD-10-CM | POA: Diagnosis not present

## 2018-05-25 DIAGNOSIS — R300 Dysuria: Secondary | ICD-10-CM | POA: Diagnosis not present

## 2018-05-25 DIAGNOSIS — K219 Gastro-esophageal reflux disease without esophagitis: Secondary | ICD-10-CM | POA: Diagnosis not present

## 2018-05-25 DIAGNOSIS — J309 Allergic rhinitis, unspecified: Secondary | ICD-10-CM | POA: Diagnosis not present

## 2018-05-25 DIAGNOSIS — E782 Mixed hyperlipidemia: Secondary | ICD-10-CM | POA: Diagnosis not present

## 2018-05-25 DIAGNOSIS — Z1389 Encounter for screening for other disorder: Secondary | ICD-10-CM | POA: Diagnosis not present

## 2018-05-25 DIAGNOSIS — R799 Abnormal finding of blood chemistry, unspecified: Secondary | ICD-10-CM | POA: Diagnosis not present

## 2018-05-25 DIAGNOSIS — E785 Hyperlipidemia, unspecified: Secondary | ICD-10-CM | POA: Diagnosis not present

## 2018-05-25 DIAGNOSIS — Z1331 Encounter for screening for depression: Secondary | ICD-10-CM | POA: Diagnosis not present

## 2018-05-25 DIAGNOSIS — I10 Essential (primary) hypertension: Secondary | ICD-10-CM | POA: Diagnosis not present

## 2018-06-12 ENCOUNTER — Ambulatory Visit (HOSPITAL_COMMUNITY)
Admission: RE | Admit: 2018-06-12 | Discharge: 2018-06-12 | Disposition: A | Discharge status: Home / Self Care | Payer: Medicare Other | Source: Ambulatory Visit | Attending: Gastroenterology | Admitting: Gastroenterology

## 2018-06-12 DIAGNOSIS — K449 Diaphragmatic hernia without obstruction or gangrene: Secondary | ICD-10-CM | POA: Diagnosis not present

## 2018-06-12 DIAGNOSIS — R634 Abnormal weight loss: Secondary | ICD-10-CM | POA: Diagnosis not present

## 2018-06-12 MED ORDER — IOPAMIDOL (ISOVUE-300) INJECTION 61%
100.0000 mL | Freq: Once | INTRAVENOUS | Status: AC | PRN
  Administered 2018-06-12: 100 mL via INTRAVENOUS

## 2018-06-18 NOTE — Progress Notes (Signed)
CT without acute findings. She does have prominent stool throughout the colon favoring constipation. I had started her on Amitiza 8 mcg BID. How is she doing with this? Need to maximize bowel regimen and may pursue outpatient hemorrhoid banding if she is still symptomatic.

## 2018-06-25 ENCOUNTER — Other Ambulatory Visit: Payer: Self-pay | Admitting: Gastroenterology

## 2018-06-25 MED ORDER — LUBIPROSTONE 8 MCG PO CAPS: 8.0000 ug | ORAL_CAPSULE | Freq: Two times a day (BID) | ORAL | 3 refills | Status: DC

## 2018-06-25 NOTE — Progress Notes (Signed)
Amitiza sent to pharmacy.  

## 2018-06-25 NOTE — Progress Notes (Signed)
Stacey: please arrange 3 month follow-up

## 2018-06-26 ENCOUNTER — Encounter: Payer: Self-pay | Admitting: Internal Medicine

## 2018-06-26 NOTE — Progress Notes (Signed)
Scheduled and letter sent

## 2018-07-12 ENCOUNTER — Emergency Department (HOSPITAL_COMMUNITY): Payer: Medicare Other

## 2018-07-12 ENCOUNTER — Emergency Department (HOSPITAL_COMMUNITY)
Admission: EM | Admit: 2018-07-12 | Discharge: 2018-07-12 | Disposition: A | Discharge status: Home / Self Care | Payer: Medicare Other | Attending: Emergency Medicine | Admitting: Emergency Medicine

## 2018-07-12 ENCOUNTER — Encounter (HOSPITAL_COMMUNITY): Payer: Self-pay | Admitting: Emergency Medicine

## 2018-07-12 ENCOUNTER — Other Ambulatory Visit: Payer: Self-pay

## 2018-07-12 DIAGNOSIS — Z79899 Other long term (current) drug therapy: Secondary | ICD-10-CM | POA: Insufficient documentation

## 2018-07-12 DIAGNOSIS — I10 Essential (primary) hypertension: Secondary | ICD-10-CM | POA: Diagnosis not present

## 2018-07-12 DIAGNOSIS — Z7982 Long term (current) use of aspirin: Secondary | ICD-10-CM | POA: Diagnosis not present

## 2018-07-12 DIAGNOSIS — K219 Gastro-esophageal reflux disease without esophagitis: Secondary | ICD-10-CM | POA: Diagnosis not present

## 2018-07-12 DIAGNOSIS — R0789 Other chest pain: Secondary | ICD-10-CM | POA: Insufficient documentation

## 2018-07-12 DIAGNOSIS — R079 Chest pain, unspecified: Secondary | ICD-10-CM | POA: Diagnosis not present

## 2018-07-12 DIAGNOSIS — E785 Hyperlipidemia, unspecified: Secondary | ICD-10-CM | POA: Diagnosis not present

## 2018-07-12 LAB — BASIC METABOLIC PANEL
Anion gap: 9 (ref 5–15)
BUN: 14 mg/dL (ref 8–23)
CO2: 27 mmol/L (ref 22–32)
CREATININE: 1.06 mg/dL — AB (ref 0.44–1.00)
Calcium: 11.4 mg/dL — ABNORMAL HIGH (ref 8.9–10.3)
Chloride: 104 mmol/L (ref 98–111)
GFR calc Af Amer: >60 mL/min (ref >60)
GFR calc non Af Amer: 54 mL/min — ABNORMAL LOW (ref >60)
Glucose, Bld: 90 mg/dL (ref 70–99)
Potassium: 3.3 mmol/L — ABNORMAL LOW (ref 3.5–5.1)
SODIUM: 140 mmol/L (ref 135–145)

## 2018-07-12 LAB — TROPONIN I
Troponin I: <0.03 ng/mL (ref <0.03)
Troponin I: <0.03 ng/mL (ref <0.03)

## 2018-07-12 LAB — CBC
HCT: 41.7 % (ref 36.0–46.0)
Hemoglobin: 13.1 g/dL (ref 12.0–15.0)
MCH: 30.3 pg (ref 26.0–34.0)
MCHC: 31.4 g/dL (ref 30.0–36.0)
MCV: 96.5 fL (ref 80.0–100.0)
Platelets: 325 10*3/uL (ref 150–400)
RBC: 4.32 MIL/uL (ref 3.87–5.11)
RDW: 11.9 % (ref 11.5–15.5)
WBC: 8.8 10*3/uL (ref 4.0–10.5)
nRBC: 0 % (ref 0.0–0.2)

## 2018-07-12 MED ORDER — SUCRALFATE 1 GM/10ML PO SUSP
1.0000 g | Freq: Once | ORAL | Status: AC
  Administered 2018-07-12: 1 g via ORAL
  Filled 2018-07-12: qty 10

## 2018-07-12 MED ORDER — SUCRALFATE 1 G PO TABS: 1.0000 g | ORAL_TABLET | Freq: Three times a day (TID) | ORAL | 0 refills | Status: AC

## 2018-07-12 NOTE — ED Triage Notes (Signed)
Pt c/o indigestion, bilateral shoulder pain that will radiate to LT breast, & intermittent chest pressure.Pt took OTC medication with no relief. Pt states when pain hits, she becomes SOB.

## 2018-07-12 NOTE — Discharge Instructions (Signed)
As discussed, your evaluation today has been largely reassuring.  But, it is important that you monitor your condition carefully, and do not hesitate to return to the ED if you develop new, or concerning changes in your condition.  For the next week please take the newly prescribed medication as instructed. In addition, please use your Prilosec daily, for the next week.  Otherwise, please follow-up with your physician for appropriate ongoing care.

## 2018-07-12 NOTE — ED Provider Notes (Signed)
[facility] EMERGENCY DEPARTMENT Provider Note   CSN: [id number] Arrival date & time: 07/12/18  1531     History   Chief Complaint Chief Complaint  Patient presents with  . Chest Pain    HPI Tammy Kramer is a 68 y.o. female.  HPI Patient presents with chest pain. Onset was yesterday morning, approximately 36 hours ago. Patient notes that she was well prior to the onset, including the night prior. She does have a history of GERD, as well as heart disease.  She takes PPI regularly. She notes that the night prior to the onset of symptoms, she had a dietary indiscretion, and subsequently slept in an unusual position. She awoke yesterday with pain in her left scapula, superior sternal area, described as sore, tight.  On since that time symptoms have been waxing, waning, and a similar distribution. No new dyspnea, no syncope, no fever, no chills, no vomiting. Patient continues to take all medication as directed.  Past Medical History:  Diagnosis Date  . Arthritis   . Back pain   . Chronic constipation   . Complication of anesthesia    difficulty waking up from anesthesia  . GERD (gastroesophageal reflux disease)   . Hemorrhoids   . Hypercholesterolemia   . Hypertension   . Seasonal allergies     Patient Active Problem List   Diagnosis Date Noted  . Vaginal discharge 06/15/2016  . Lateral meniscus tear, current   . Hemorrhoids 07/29/2014  . Rectal bleeding 03/24/2014  . Constipation 03/24/2014  . SHOULDER PAIN 08/10/2009  . IMPINGEMENT SYNDROME 08/10/2009  . RUPTURE ROTATOR CUFF 08/10/2009  . GERD 07/28/2009  . HEMATOCHEZIA 07/28/2009  . WEIGHT LOSS, ABNORMAL 07/28/2009  . DYSPHAGIA UNSPECIFIED 07/28/2009  . CHANGE IN BOWELS 07/28/2009  . EPIGASTRIC PAIN 07/28/2009  . LOW BACK PAIN 06/02/2008    Past Surgical History:  Procedure Laterality Date  . ABDOMINAL HYSTERECTOMY    . BACK SURGERY    . C4-C5 fusion    . CATARACT EXTRACTION W/PHACO Right 05/02/2017    Procedure: CATARACT EXTRACTION PHACO AND INTRAOCULAR LENS PLACEMENT (IOC);  Surgeon: Jethro Bolus, MD;  Location: [facility];  Service: Ophthalmology;  Laterality: Right;  CDE: 5.99  . CATARACT EXTRACTION W/PHACO Left 05/16/2017   Procedure: CATARACT EXTRACTION PHACO AND INTRAOCULAR LENS PLACEMENT (IOC);  Surgeon: Jethro Bolus, MD;  Location: [facility];  Service: Ophthalmology;  Laterality: Left;  CDE: 3.80  . cholecystectomy    . CHOLECYSTECTOMY    . COLONOSCOPY   10/16/2006   NUR: 3 mm polyp ablated via cold biopsy from the splenic flexure.External hemorrhoids, hyperplastic polyp  . COLONOSCOPY  Feb 2011   Dr. Jena Gauss: internal hemorrhoids, otherwise normal.   . COLONOSCOPY N/A 04/08/2014   RMR: Internal hemorrhoids; colonic diverticulosis( few  as described above) hematochezia likely secondary to hemorrhoids in the setting of constipation.  . COLONOSCOPY N/A 12/08/2016   Dr. Jena Gauss: none bleeding internal hemorrhoids which were small. Next colonoscopy in five years.  Marland Kitchen DILATION AND CURETTAGE OF UTERUS     multiple  . ESOPHAGOGASTRODUODENOSCOPY  06/14/2006   NUR: Normal esophagogastroduodenoscopy  . ESOPHAGOGASTRODUODENOSCOPY  Feb 2011   Dr. Jena Gauss: inflamed-appearing esophagus with overlying distal esophageal erosions superimposed on noncritical Schatzki ring, s/p dilation and biopsy. Benign path  . ESOPHAGOGASTRODUODENOSCOPY N/A 12/08/2016   Dr. Jena Gauss: LA grade A esophagitis, mild schatzki's ring at the GE junction, small hiatal hernia, esophageal dilation performed. Prevacid increased to BID.  . foot surgery     X 5-bone  spurs and tendon release; morton's neuroma.  Marland Kitchen. HAND SURGERY Right    X 2-carpal tunnel release and ganglion cyst removed.  Marland Kitchen. HEMORRHOID BANDING  2016   Dr.Rourk  . KNEE ARTHROSCOPY WITH LATERAL MENISECTOMY Left 03/27/2015   Procedure: KNEE ARTHROSCOPY WITH LATERAL MENISECTOMY;  Surgeon: Vickki HearingStanley E Harrison, MD;  Location: [facility];  Service: Orthopedics;  Laterality: Left;  .  ROTATOR CUFF REPAIR Right      OB History    Gravida  3   Para  3   Term  1   Preterm  2   AB      Living  3     SAB      TAB      Ectopic      Multiple      Live Births  3            Home Medications    Prior to Admission medications   Medication Sig Start Date End Date Taking? Authorizing Provider  aspirin 81 MG chewable tablet Chew 243 mg by mouth daily.   Yes [provider]  atorvastatin (LIPITOR) 40 MG tablet Take 40 mg by mouth daily.  10/03/16  Yes [provider]  bismuth subsalicylate (PEPTO BISMOL) 262 MG chewable tablet Chew 524 mg by mouth as needed for indigestion.   Yes [provider]  calcium carbonate (TUMS - DOSED IN MG ELEMENTAL CALCIUM) 500 MG chewable tablet Chew 1 tablet by mouth daily.   Yes [provider]  fluticasone (FLONASE) 50 MCG/ACT nasal spray Place 1 spray into both nostrils daily.  04/27/18  Yes [provider]  ibuprofen (ADVIL,MOTRIN) 200 MG tablet Take 200 mg by mouth every 8 (eight) hours as needed (for pain).    Yes [provider]  lansoprazole (PREVACID) 30 MG capsule Take 30 mg by mouth every other day. 30 mins before meal   Yes [provider]  loratadine (CLARITIN) 10 MG tablet Take 10 mg by mouth daily.   Yes [provider]  lubiprostone (AMITIZA) 8 MCG capsule Take 1 capsule (8 mcg total) by mouth 2 (two) times daily with a meal. 06/25/18  Yes Gelene MinkBoone, Anna W, NP  PROCTOZONE-HC 2.5 % rectal cream USE RECTALLY TWICE DAILY Patient taking differently: Place 1 application rectally 3 (three) times daily.  11/15/17  Yes Gelene MinkBoone, Anna W, NP  valsartan-hydrochlorothiazide (DIOVAN-HCT) 160-12.5 MG per tablet Take 2 tablets by mouth daily.    Yes [provider]  Naphazoline HCl (CLEAR EYES OP) Place 1 drop 2 (two) times daily as needed into both eyes (dry eyes).     [provider]  traMADol (ULTRAM) 50 MG tablet Take 50 mg by mouth every 6 (six)  hours as needed.    [provider]    Family History Family History  Problem Relation Age of Onset  . Hypertension Mother   . Aneurysm Mother   . Diabetes Mother   . Heart disease Father   . Heart disease Son   . Colon cancer Brother        age 69, deceased 2 weeks later    Social History Social History   Tobacco Use  . Smoking status: Never Smoker  . Smokeless tobacco: Never Used  . Tobacco comment: Never smoked  Substance Use Topics  . Alcohol use: No  . Drug use: No     Allergies   Codeine and Meperidine hcl   Review of Systems Review of Systems  Constitutional:  Per HPI, otherwise negative  HENT:       Per HPI, otherwise negative  Respiratory:       Per HPI, otherwise negative  Cardiovascular:       Per HPI, otherwise negative  Gastrointestinal: Negative for vomiting.  Endocrine:       Negative aside from HPI  Genitourinary:       Neg aside from HPI   Musculoskeletal:       Per HPI, otherwise negative  Skin: Negative.   Neurological: Negative for syncope.     Physical Exam Updated Vital Signs BP 137/80   Pulse 81   Temp 97.6 F (36.4 C) (Oral)   Resp 13   Ht 5\' 4"  (1.626 m)   Wt 75.3 kg   SpO2 96%   BMI 28.49 kg/m   Physical Exam Vitals signs and nursing note reviewed.  Constitutional:      General: She is not in acute distress.    Appearance: She is well-developed.  HENT:     Head: Normocephalic and atraumatic.  Eyes:     Conjunctiva/sclera: Conjunctivae normal.  Cardiovascular:     Rate and Rhythm: Normal rate and regular rhythm.  Pulmonary:     Effort: Pulmonary effort is normal. No respiratory distress.     Breath sounds: Normal breath sounds. No stridor.  Abdominal:     General: There is no distension.  Skin:    General: Skin is warm and dry.  Neurological:     Mental Status: She is alert and oriented to person, place, and time.     Cranial Nerves: No cranial nerve deficit.      ED Treatments / Results   Labs (all labs ordered are listed, but only abnormal results are displayed) Labs Reviewed  BASIC METABOLIC PANEL - Abnormal; Notable for the following components:      Result Value   Potassium 3.3 (*)    Creatinine, Ser 1.06 (*)    Calcium 11.4 (*)    GFR calc non Af Amer 54 (*)    All other components within normal limits  CBC  TROPONIN I  TROPONIN I    EKG EKG Interpretation  Date/Time:  Thursday July 12 2018 15:37:40 EST Ventricular Rate:  103 PR Interval:  160 QRS Duration: 70 QT Interval:  312 QTC Calculation: 408 R Axis:   112 Text Interpretation:  Sinus tachycardia Biatrial enlargement Right axis deviation Artifact No significant change since last tracing Abnormal ekg Confirmed by Gerhard Munch [phone]) on 07/12/2018 5:32:04 PM   Radiology Dg Chest 2 View  Result Date: 07/12/2018 CLINICAL DATA:  Left chest pain and pressure. Shortness of breath. EXAM: CHEST - 2 VIEW COMPARISON:  07/20/2006. FINDINGS: The cardiac silhouette remains borderline enlarged. Clear lungs with normal vascularity. Mild thoracic spine degenerative changes. Cholecystectomy clips. IMPRESSION: No acute findings. Electronically Signed   By: Beckie Salts M.D.   On: 07/12/2018 16:11    Procedures Procedures (including critical care time)  Medications Ordered in ED Medications  sucralfate (CARAFATE) 1 GM/10ML suspension 1 g (1 g Oral Given 07/12/18 1836)     Initial Impression / Assessment and Plan / ED Course  I have reviewed the triage vital signs and the nursing notes.  Pertinent labs & imaging results that were available during my care of the patient were reviewed by me and considered in my medical decision making (see chart for details).     Update: After initial troponin, the patient remains in no distress, awake and alert,  hemodynamically unremarkable.  8:58 PM Patient awake and alert, in no distress. She has received Carafate, has improvement in her condition. With 2 normal  troponin, unremarkable EKG, reassuring x-ray, and no ongoing complaints, low suspicion for atypical ACS, or other acute new pathology. Suspicion for gastroesophageal etiology given her history. Patient has a GI physician with whom she will follow-up if she does not improve, is appropriate for discharge with a modified course of medication.   Final Clinical Impressions(s) / ED Diagnoses  Atypical chest pain   Gerhard Munch, MD 07/12/18 2059

## 2018-07-24 ENCOUNTER — Ambulatory Visit: Payer: Medicare Other | Admitting: Cardiovascular Disease

## 2018-07-24 ENCOUNTER — Encounter: Payer: Self-pay | Admitting: Cardiovascular Disease

## 2018-07-24 ENCOUNTER — Encounter: Payer: Self-pay | Admitting: *Deleted

## 2018-07-24 VITALS — BP 158/70 | HR 90 | Ht 64.5 in | Wt 167.0 lb

## 2018-07-24 DIAGNOSIS — E7800 Pure hypercholesterolemia, unspecified: Secondary | ICD-10-CM

## 2018-07-24 DIAGNOSIS — K219 Gastro-esophageal reflux disease without esophagitis: Secondary | ICD-10-CM

## 2018-07-24 DIAGNOSIS — Z8249 Family history of ischemic heart disease and other diseases of the circulatory system: Secondary | ICD-10-CM | POA: Diagnosis not present

## 2018-07-24 DIAGNOSIS — R079 Chest pain, unspecified: Secondary | ICD-10-CM

## 2018-07-24 DIAGNOSIS — I10 Essential (primary) hypertension: Secondary | ICD-10-CM

## 2018-07-24 NOTE — Progress Notes (Signed)
CARDIOLOGY CONSULT NOTE  Patient ID: Tammy Kramer MRN: [id number] DOB/AGE: 69-Jul-1952 69 y.o.  Admit date: (Not on file) Primary Physician: Avon Gully, MD Referring Physician: Avon Gully, MD  Reason for Consultation: Chest pain  HPI: Tammy Kramer is a 68 y.o. female who is being seen today for the evaluation of chest pain at the request of Avon Gully, MD.  Past medical history includes hyperlipidemia, hypertension, and GERD.  She was evaluated in the ED for chest pain on 07/12/2018.  I reviewed all relevant documentation, labs, and studies.  She was given Carafate and symptoms improved.  Troponins were normal.  It was suspected her symptoms were GI in etiology.  She was mildly hypokalemic, potassium 3.3.  CBC was normal.  Chest x-ray showed no acute findings.  I personally reviewed the ECG which showed sinus tachycardia, 103 bpm.  She told me that she has been awakening with a left-sided chest heaviness and pressure.  It occurs before eating.  She also has left infrascapular pains which radiate around to the left side of her chest.  At times it has been associated with nausea and shortness of breath particularly on the day she went to the ED.  She has had chills but denies fevers.  She denies dysphagia for solids and liquids.  She denies leg swelling, palpitations, orthopnea, and paroxysmal nocturnal dyspnea.  CT of the abdomen on 06/12/2018 showed no acute intra-abdominal process.  There was prominent stool throughout the colon which favored constipation.  Family history: Her sister, [name], is also my patient.  She has heart disease.  Her father died of CHF.  She has a son with atrial fibrillation.  She has a daughter who lives in [place] and works in [place].   Allergies  Allergen Reactions  . Codeine Other (See Comments)    Hallucinations/dizziness/"out of it"  . Meperidine Hcl Other (See Comments)   Dizziness/headaches/hallucinations    Current Outpatient Medications  Medication Sig Dispense Refill  . aspirin EC 81 MG tablet Take 81 mg by mouth daily.    Marland Kitchen atorvastatin (LIPITOR) 40 MG tablet Take 40 mg by mouth daily.     Marland Kitchen bismuth subsalicylate (PEPTO BISMOL) 262 MG chewable tablet Chew 524 mg by mouth as needed for indigestion.    . calcium carbonate (TUMS - DOSED IN MG ELEMENTAL CALCIUM) 500 MG chewable tablet Chew 1 tablet by mouth daily.    . fluticasone (FLONASE) 50 MCG/ACT nasal spray Place 1 spray into both nostrils daily.   3  . ibuprofen (ADVIL,MOTRIN) 200 MG tablet Take 200 mg by mouth every 8 (eight) hours as needed (for pain).     Marland Kitchen lansoprazole (PREVACID) 30 MG capsule Take 30 mg by mouth every other day. 30 mins before meal    . loratadine (CLARITIN) 10 MG tablet Take 10 mg by mouth daily.    Marland Kitchen lubiprostone (AMITIZA) 8 MCG capsule Take 1 capsule (8 mcg total) by mouth 2 (two) times daily with a meal. 60 capsule 3  . Naphazoline HCl (CLEAR EYES OP) Place 1 drop 2 (two) times daily as needed into both eyes (dry eyes).     Marland Kitchen PROCTOZONE-HC 2.5 % rectal cream USE RECTALLY TWICE DAILY (Patient taking differently: Place 1 application rectally 3 (three) times daily. ) 30 g 0  . sucralfate (CARAFATE) 1 g tablet Take 1 tablet (1 g total) by mouth 4 (four) times daily -  with meals and at bedtime. 21 tablet  0  . traMADol (ULTRAM) 50 MG tablet Take 50 mg by mouth every 6 (six) hours as needed.    . valsartan-hydrochlorothiazide (DIOVAN-HCT) 160-12.5 MG per tablet Take 2 tablets by mouth daily.      No current facility-administered medications for this visit.     Past Medical History:  Diagnosis Date  . Arthritis   . Back pain   . Chronic constipation   . Complication of anesthesia    difficulty waking up from anesthesia  . GERD (gastroesophageal reflux disease)   . Hemorrhoids   . Hypercholesterolemia   . Hypertension   . Seasonal allergies     Past Surgical History:    Procedure Laterality Date  . ABDOMINAL HYSTERECTOMY    . BACK SURGERY    . C4-C5 fusion    . CATARACT EXTRACTION W/PHACO Right 05/02/2017   Procedure: CATARACT EXTRACTION PHACO AND INTRAOCULAR LENS PLACEMENT (IOC);  Surgeon: Jethro BolusShapiro, Mark, MD;  Location: [facility];  Service: Ophthalmology;  Laterality: Right;  CDE: 5.99  . CATARACT EXTRACTION W/PHACO Left 05/16/2017   Procedure: CATARACT EXTRACTION PHACO AND INTRAOCULAR LENS PLACEMENT (IOC);  Surgeon: Jethro BolusShapiro, Mark, MD;  Location: [facility];  Service: Ophthalmology;  Laterality: Left;  CDE: 3.80  . cholecystectomy    . CHOLECYSTECTOMY    . COLONOSCOPY   10/16/2006   NUR: 3 mm polyp ablated via cold biopsy from the splenic flexure.External hemorrhoids, hyperplastic polyp  . COLONOSCOPY  Feb 2011   Dr. Jena Gaussourk: internal hemorrhoids, otherwise normal.   . COLONOSCOPY N/A 04/08/2014   RMR: Internal hemorrhoids; colonic diverticulosis( few  as described above) hematochezia likely secondary to hemorrhoids in the setting of constipation.  . COLONOSCOPY N/A 12/08/2016   Dr. Jena Gaussourk: none bleeding internal hemorrhoids which were small. Next colonoscopy in five years.  Marland Kitchen. DILATION AND CURETTAGE OF UTERUS     multiple  . ESOPHAGOGASTRODUODENOSCOPY  06/14/2006   NUR: Normal esophagogastroduodenoscopy  . ESOPHAGOGASTRODUODENOSCOPY  Feb 2011   Dr. Jena Gaussourk: inflamed-appearing esophagus with overlying distal esophageal erosions superimposed on noncritical Schatzki ring, s/p dilation and biopsy. Benign path  . ESOPHAGOGASTRODUODENOSCOPY N/A 12/08/2016   Dr. Jena Gaussourk: LA grade A esophagitis, mild schatzki's ring at the GE junction, small hiatal hernia, esophageal dilation performed. Prevacid increased to BID.  . foot surgery     X 5-bone spurs and tendon release; morton's neuroma.  Marland Kitchen. HAND SURGERY Right    X 2-carpal tunnel release and ganglion cyst removed.  Marland Kitchen. HEMORRHOID BANDING  2016   Dr.Rourk  . KNEE ARTHROSCOPY WITH LATERAL MENISECTOMY Left 03/27/2015   Procedure:  KNEE ARTHROSCOPY WITH LATERAL MENISECTOMY;  Surgeon: Vickki HearingStanley E Harrison, MD;  Location: [facility];  Service: Orthopedics;  Laterality: Left;  . ROTATOR CUFF REPAIR Right     Social History   Socioeconomic History  . Marital status: Divorced    Spouse name: Not on file  . Number of children: Not on file  . Years of education: Not on file  . Highest education level: Not on file  Occupational History  . Not on file  Social Needs  . Financial resource strain: Not on file  . Food insecurity:    Worry: Not on file    Inability: Not on file  . Transportation needs:    Medical: Not on file    Non-medical: Not on file  Tobacco Use  . Smoking status: Never Smoker  . Smokeless tobacco: Never Used  . Tobacco comment: Never smoked  Substance and Sexual Activity  . Alcohol use: No  .  Drug use: No  . Sexual activity: Yes    Birth control/protection: Surgical    Comment: hyst  Lifestyle  . Physical activity:    Days per week: Not on file    Minutes per session: Not on file  . Stress: Not on file  Relationships  . Social connections:    Talks on phone: Not on file    Gets together: Not on file    Attends religious service: Not on file    Active member of club or organization: Not on file    Attends meetings of clubs or organizations: Not on file    Relationship status: Not on file  . Intimate partner violence:    Fear of current or ex partner: Not on file    Emotionally abused: Not on file    Physically abused: Not on file    Forced sexual activity: Not on file  Other Topics Concern  . Not on file  Social History Narrative  . Not on file      Current Meds  Medication Sig  . aspirin EC 81 MG tablet Take 81 mg by mouth daily.  Marland Kitchen atorvastatin (LIPITOR) 40 MG tablet Take 40 mg by mouth daily.   Marland Kitchen bismuth subsalicylate (PEPTO BISMOL) 262 MG chewable tablet Chew 524 mg by mouth as needed for indigestion.  . calcium carbonate (TUMS - DOSED IN MG ELEMENTAL CALCIUM) 500 MG chewable  tablet Chew 1 tablet by mouth daily.  . fluticasone (FLONASE) 50 MCG/ACT nasal spray Place 1 spray into both nostrils daily.   Marland Kitchen ibuprofen (ADVIL,MOTRIN) 200 MG tablet Take 200 mg by mouth every 8 (eight) hours as needed (for pain).   Marland Kitchen lansoprazole (PREVACID) 30 MG capsule Take 30 mg by mouth every other day. 30 mins before meal  . loratadine (CLARITIN) 10 MG tablet Take 10 mg by mouth daily.  Marland Kitchen lubiprostone (AMITIZA) 8 MCG capsule Take 1 capsule (8 mcg total) by mouth 2 (two) times daily with a meal.  . Naphazoline HCl (CLEAR EYES OP) Place 1 drop 2 (two) times daily as needed into both eyes (dry eyes).   Marland Kitchen PROCTOZONE-HC 2.5 % rectal cream USE RECTALLY TWICE DAILY (Patient taking differently: Place 1 application rectally 3 (three) times daily. )  . sucralfate (CARAFATE) 1 g tablet Take 1 tablet (1 g total) by mouth 4 (four) times daily -  with meals and at bedtime.  . traMADol (ULTRAM) 50 MG tablet Take 50 mg by mouth every 6 (six) hours as needed.  . valsartan-hydrochlorothiazide (DIOVAN-HCT) 160-12.5 MG per tablet Take 2 tablets by mouth daily.       Review of systems complete and found to be negative unless listed above in HPI    Physical exam Blood pressure (!) 158/70, pulse 90, height 5' 4.5" (1.638 m), weight 167 lb (75.8 kg), SpO2 97 %. General: NAD Neck: No JVD, no thyromegaly or thyroid nodule.  Lungs: Clear to auscultation bilaterally with normal respiratory effort. CV: Nondisplaced PMI. Regular rate and rhythm, normal S1/S2, no S3/S4, no murmur.  No peripheral edema.  No carotid bruit.    Abdomen: Soft, nontender, no distention.  Skin: Intact without lesions or rashes.  Neurologic: Alert and oriented x 3.  Psych: Normal affect. Extremities: No clubbing or cyanosis.  HEENT: Normal.   ECG: Most recent ECG reviewed.   Labs: Lab Results  Component Value Date/Time   K 3.3 (L) 07/12/2018 04:26 PM   BUN 14 07/12/2018 04:26 PM   CREATININE 1.06 (H)  07/12/2018 04:26 PM    HGB 13.1 07/12/2018 04:26 PM     Lipids: No results found for: LDLCALC, LDLDIRECT, CHOL, TRIG, HDL      ASSESSMENT AND PLAN:  1.  Chest pain: Unclear etiology with mixed symptoms.  She has a strong family history of heart disease.  I will obtain an exercise Myoview to evaluate for ischemic heart disease. I will order a 2-D echocardiogram with Doppler to evaluate cardiac structure, function, and regional wall motion.  2.  Hypertension: Blood pressure is elevated.  3.  Hyperlipidemia: Followed by PCP.  Currently on atorvastatin 40 mg daily.  4.  GERD: She is on multiple medications for this which are reviewed above.  She denies dysphagia for solids or liquids.   Disposition: Follow up in 3 months  Signed: Prentice Docker, M.D., F.A.C.C.  07/24/2018, 1:22 PM

## 2018-07-24 NOTE — Patient Instructions (Signed)
Medication Instructions:  Your physician recommends that you continue on your current medications as directed. Please refer to the Current Medication list given to you today.  If you need a refill on your cardiac medications before your next appointment, please call your pharmacy.   Lab work: NONE  If you have labs (blood work) drawn today and your tests are completely normal, you will receive your results only by: Marland Kitchen. MyChart Message (if you have MyChart) OR . A paper copy in the mail If you have any lab test that is abnormal or we need to change your treatment, we will call you to review the results.  Testing/Procedures: Your physician has requested that you have an echocardiogram. Echocardiography is a painless test that uses sound waves to create images of your heart. It provides your doctor with information about the size and shape of your heart and how well your heart's chambers and valves are working. This procedure takes approximately one hour. There are no restrictions for this procedure.  Your physician has requested that you have en exercise stress myoview. For further information please visit [email or web address]. Please follow instruction sheet, as given.    Follow-Up: At [facility], you and your health needs are our priority.  As part of our continuing mission to provide you with exceptional heart care, we have created designated Provider Care Teams.  These Care Teams include your primary Cardiologist (physician) and Advanced Practice Providers (APPs -  Physician Assistants and Nurse Practitioners) who all work together to provide you with the care you need, when you need it. You will need a follow up appointment in 3 months.  Please call our office 2 months in advance to schedule this appointment.  You may see No primary care provider on file. or one of the following Advanced Practice Providers on your designated Care Team:   [place]rader, PA-C [facility]([place] Office) . Jacolyn ReedyMichele  Lenze, PA-C [facility]([place] Office)  Any Other Special Instructions Will Be Listed Below (If Applicable). Thank you for choosing [facility]!

## 2018-08-01 ENCOUNTER — Ambulatory Visit (HOSPITAL_COMMUNITY)
Admission: RE | Admit: 2018-08-01 | Discharge: 2018-08-01 | Disposition: A | Discharge status: Home / Self Care | Payer: Medicare Other | Source: Ambulatory Visit | Attending: Cardiovascular Disease | Admitting: Cardiovascular Disease

## 2018-08-01 ENCOUNTER — Encounter (HOSPITAL_COMMUNITY)
Admission: RE | Admit: 2018-08-01 | Discharge: 2018-08-01 | Disposition: A | Discharge status: Home / Self Care | Payer: Medicare Other | Source: Ambulatory Visit | Attending: Cardiovascular Disease | Admitting: Cardiovascular Disease

## 2018-08-01 ENCOUNTER — Encounter (HOSPITAL_BASED_OUTPATIENT_CLINIC_OR_DEPARTMENT_OTHER)
Admission: RE | Admit: 2018-08-01 | Discharge: 2018-08-01 | Disposition: A | Discharge status: Home / Self Care | Payer: Medicare Other | Source: Ambulatory Visit | Attending: Cardiovascular Disease | Admitting: Cardiovascular Disease

## 2018-08-01 DIAGNOSIS — R079 Chest pain, unspecified: Secondary | ICD-10-CM | POA: Insufficient documentation

## 2018-08-01 LAB — NM MYOCAR MULTI W/SPECT W/WALL MOTION / EF
CHL CUP NUCLEAR SRS: 0
CHL RATE OF PERCEIVED EXERTION: 13
CSEPEW: 4.6 METS
Exercise duration (min): 2 min
Exercise duration (sec): 50 s
LV dias vol: 52 mL (ref 46–106)
LV sys vol: 16 mL
MPHR: 152 {beats}/min
Peak HR: 139 {beats}/min
Percent HR: 91 %
RATE: 0.44
Rest HR: 73 {beats}/min
SDS: 0
SSS: 0
TID: 1.54

## 2018-08-01 MED ORDER — SODIUM CHLORIDE 0.9% FLUSH
INTRAVENOUS | Status: AC
  Administered 2018-08-01: 10 mL via INTRAVENOUS
  Filled 2018-08-01: qty 10

## 2018-08-01 MED ORDER — TECHNETIUM TC 99M TETROFOSMIN IV KIT
10.0000 | PACK | Freq: Once | INTRAVENOUS | Status: AC | PRN
  Administered 2018-08-01: 10.22 via INTRAVENOUS

## 2018-08-01 MED ORDER — TECHNETIUM TC 99M TETROFOSMIN IV KIT
30.0000 | PACK | Freq: Once | INTRAVENOUS | Status: AC | PRN
  Administered 2018-08-01: 31.6 via INTRAVENOUS

## 2018-08-01 MED ORDER — REGADENOSON 0.4 MG/5ML IV SOLN
INTRAVENOUS | Status: AC
  Filled 2018-08-01: qty 5

## 2018-08-01 NOTE — Progress Notes (Signed)
*  PRELIMINARY RESULTS* Echocardiogram 2D Echocardiogram has been performed.  Stacey Drain 08/01/2018, 11:02 AM

## 2018-08-24 DIAGNOSIS — K219 Gastro-esophageal reflux disease without esophagitis: Secondary | ICD-10-CM | POA: Diagnosis not present

## 2018-08-24 DIAGNOSIS — I509 Heart failure, unspecified: Secondary | ICD-10-CM | POA: Diagnosis not present

## 2018-08-24 DIAGNOSIS — I10 Essential (primary) hypertension: Secondary | ICD-10-CM | POA: Diagnosis not present

## 2018-09-10 ENCOUNTER — Ambulatory Visit: Payer: Medicare Other | Admitting: Orthopedic Surgery

## 2018-10-02 ENCOUNTER — Encounter: Payer: Self-pay | Admitting: Gastroenterology

## 2018-10-02 ENCOUNTER — Ambulatory Visit (INDEPENDENT_AMBULATORY_CARE_PROVIDER_SITE_OTHER): Payer: Medicare Other | Admitting: Gastroenterology

## 2018-10-02 ENCOUNTER — Other Ambulatory Visit: Payer: Self-pay

## 2018-10-02 DIAGNOSIS — K5900 Constipation, unspecified: Secondary | ICD-10-CM

## 2018-10-02 DIAGNOSIS — K649 Unspecified hemorrhoids: Secondary | ICD-10-CM

## 2018-10-02 MED ORDER — LUBIPROSTONE 8 MCG PO CAPS: 8.0000 ug | ORAL_CAPSULE | Freq: Two times a day (BID) | ORAL | 5 refills | Status: DC

## 2018-10-02 NOTE — Patient Instructions (Signed)
I have refilled Amitiza for you. Continue to take this twice a day with food.  I will see you in 2 months! We will likely pursue hemorrhoid banding at that time.  Please call if you need anything!  I enjoyed talking with you again today! As you know, I value our relationship and want to provide genuine, compassionate, and quality care. I welcome your feedback. If you receive a survey regarding your visit,  I greatly appreciate you taking time to fill this out. See you next time!  Gelene Mink, PhD, ANP-BC [facility]

## 2018-10-02 NOTE — Progress Notes (Signed)
Primary Care Physician:  Avon Gully, MD  Primary GI: Dr. Jena Gauss   Virtual Visit via Telephone Note Due to COVID-19, visit is conducted virtually and was requested by patient.   I connected with Tammy Kramer on 10/02/18 at  1:30 PM EDT by telephone and verified that I am speaking with the correct person using two identifiers.   I discussed the limitations, risks, security and privacy concerns of performing an evaluation and management service by telephone and the availability of in person appointments. I also discussed with the patient that there may be a patient responsible charge related to this service. The patient expressed understanding and agreed to proceed.  Chief Complaint  Patient presents with  . Constipation    f/u. Doing okay. Having BM more regular now  . Hemorrhoids    wants to discuss banding     History of Present Illness: 68 year old female with history of GERD, constipation, recently found to have weight loss of 20 pounds from Feb 2019 to Dec 2019.   Amitiza decreased to 8 mcg BID at last visit. Internal hemorrhoids noted on anoscopy at last visit. CT due to weight loss was unrevealing other than prominent stool through colon.   Doing better with Amitiza 8 mcg BID. No rectal bleeding. Slight pressure in rectum but noted more if stool is "a little tight" but not straining. Gets up and walks around, then eases off. Lifting heavier objects sometimes bothers her. Overall improved from previously.   Evaluated by cardiology in interim from last visit.   Would like to pursue hemorrhoid banding when able to come into the office.   States she is now 89. Gained 10 lbs. Trying to finish online course.   Past Medical History:  Diagnosis Date  . Arthritis   . Back pain   . Chronic constipation   . Complication of anesthesia    difficulty waking up from anesthesia  . GERD (gastroesophageal reflux disease)   . Hemorrhoids   . Hypercholesterolemia   .  Hypertension   . Seasonal allergies      Past Surgical History:  Procedure Laterality Date  . ABDOMINAL HYSTERECTOMY    . BACK SURGERY    . C4-C5 fusion    . CATARACT EXTRACTION W/PHACO Right 05/02/2017   Procedure: CATARACT EXTRACTION PHACO AND INTRAOCULAR LENS PLACEMENT (IOC);  Surgeon: Jethro Bolus, MD;  Location: [facility];  Service: Ophthalmology;  Laterality: Right;  CDE: 5.99  . CATARACT EXTRACTION W/PHACO Left 05/16/2017   Procedure: CATARACT EXTRACTION PHACO AND INTRAOCULAR LENS PLACEMENT (IOC);  Surgeon: Jethro Bolus, MD;  Location: [facility];  Service: Ophthalmology;  Laterality: Left;  CDE: 3.80  . cholecystectomy    . CHOLECYSTECTOMY    . COLONOSCOPY   10/16/2006   NUR: 3 mm polyp ablated via cold biopsy from the splenic flexure.External hemorrhoids, hyperplastic polyp  . COLONOSCOPY  Feb 2011   Dr. Jena Gauss: internal hemorrhoids, otherwise normal.   . COLONOSCOPY N/A 04/08/2014   RMR: Internal hemorrhoids; colonic diverticulosis( few  as described above) hematochezia likely secondary to hemorrhoids in the setting of constipation.  . COLONOSCOPY N/A 12/08/2016   Dr. Jena Gauss: none bleeding internal hemorrhoids which were small. Next colonoscopy in five years.  Marland Kitchen DILATION AND CURETTAGE OF UTERUS     multiple  . ESOPHAGOGASTRODUODENOSCOPY  06/14/2006   NUR: Normal esophagogastroduodenoscopy  . ESOPHAGOGASTRODUODENOSCOPY  Feb 2011   Dr. Jena Gauss: inflamed-appearing esophagus with overlying distal esophageal erosions superimposed on noncritical Schatzki ring, s/p dilation and biopsy. Benign  path  . ESOPHAGOGASTRODUODENOSCOPY N/A 12/08/2016   Dr. Jena Gaussourk: LA grade A esophagitis, mild schatzki's ring at the GE junction, small hiatal hernia, esophageal dilation performed. Prevacid increased to BID.  . foot surgery     X 5-bone spurs and tendon release; morton's neuroma.  Marland Kitchen. HAND SURGERY Right    X 2-carpal tunnel release and ganglion cyst removed.  Marland Kitchen. HEMORRHOID BANDING  2016   Dr.Rourk  .  KNEE ARTHROSCOPY WITH LATERAL MENISECTOMY Left 03/27/2015   Procedure: KNEE ARTHROSCOPY WITH LATERAL MENISECTOMY;  Surgeon: Vickki HearingStanley E Harrison, MD;  Location: [facility];  Service: Orthopedics;  Laterality: Left;  . ROTATOR CUFF REPAIR Right      Current Meds  Medication Sig  . aspirin EC 81 MG tablet Take 81 mg by mouth daily.  Marland Kitchen. atorvastatin (LIPITOR) 40 MG tablet Take 40 mg by mouth daily.   Marland Kitchen. bismuth subsalicylate (PEPTO BISMOL) 262 MG chewable tablet Chew 524 mg by mouth as needed for indigestion.  . calcium carbonate (TUMS - DOSED IN MG ELEMENTAL CALCIUM) 500 MG chewable tablet Chew 1 tablet by mouth as needed.   . fluticasone (FLONASE) 50 MCG/ACT nasal spray Place 1 spray into both nostrils daily.   Marland Kitchen. ibuprofen (ADVIL,MOTRIN) 200 MG tablet Take 200 mg by mouth every 8 (eight) hours as needed (for pain).   Marland Kitchen. lansoprazole (PREVACID) 30 MG capsule Take 30 mg by mouth every other day. 30 mins before meal  . loratadine (CLARITIN) 10 MG tablet Take 10 mg by mouth daily.  Marland Kitchen. lubiprostone (AMITIZA) 8 MCG capsule Take 1 capsule (8 mcg total) by mouth 2 (two) times daily with a meal.  . Naphazoline HCl (CLEAR EYES OP) Place 1 drop 2 (two) times daily as needed into both eyes (dry eyes).   Marland Kitchen. PROCTOZONE-HC 2.5 % rectal cream USE RECTALLY TWICE DAILY (Patient taking differently: Place 1 application rectally as needed. )  . sucralfate (CARAFATE) 1 g tablet Take 1 tablet (1 g total) by mouth 4 (four) times daily -  with meals and at bedtime. (Patient taking differently: Take 1 g by mouth 4 (four) times daily -  with meals and at bedtime. As needed)  . valsartan-hydrochlorothiazide (DIOVAN-HCT) 160-25 MG tablet Take 1 tablet by mouth daily.       Observations/Objective: No distress. Unable to perform physical exam due to telephone encounter. No video available.   Assessment and Plan: 68 year old female with history of constipation, doing well with Amitiza 8 mcg BID. More tailored bowel regimen has  helped with symptomatic internal hemorrhoids. Would be a good candidate in future for outpatient hemorrhoid banding once face-to-face visits are resumed.  Encouragingly, she has gained weight. CT recently unrevealing.   Return in 2 months for office visit/likely hemorrhoid banding.   Follow Up Instructions: I have refilled Amitiza for you. Continue to take this twice a day with food.  I will see you in 2 months! We will likely pursue hemorrhoid banding at that time.  Please call if you need anything!   I discussed the assessment and treatment plan with the patient. The patient was provided an opportunity to ask questions and all were answered. The patient agreed with the plan and demonstrated an understanding of the instructions.   The patient was advised to call back or seek an in-person evaluation if the symptoms worsen or if the condition fails to improve as anticipated.  I provided 15 minutes of non-face-to-face time during this encounter.  Gelene MinkAnna W. Murphy Duzan, PhD, ANP-BC [facility]

## 2018-10-03 ENCOUNTER — Encounter: Payer: Self-pay | Admitting: Internal Medicine

## 2018-10-03 NOTE — Progress Notes (Signed)
CC'ED TO PCP 

## 2018-10-18 ENCOUNTER — Telehealth: Payer: Self-pay | Admitting: Cardiovascular Disease

## 2018-10-18 NOTE — Telephone Encounter (Signed)
Patient gave consent ° ° °YOUR CARDIOLOGY TEAM HAS ARRANGED FOR AN E-VISIT FOR YOUR APPOINTMENT - PLEASE REVIEW IMPORTANT INFORMATION BELOW SEVERAL DAYS PRIOR TO YOUR APPOINTMENT ° °Due to the recent COVID-19 pandemic, we are transitioning in-person office visits to tele-medicine visits in an effort to decrease unnecessary exposure to our patients and staff. Medicare and most insurances are covering these visits without a copay needed. We also encourage you to sign up for MyChart if you have not already done so. You will need a smartphone if possible. For patients that do not have this, we can still complete the visit using a regular telephone but do prefer a smartphone to enable video when possible. You may have a close family member that lives with you that can help. If possible, we also ask that you have a blood pressure cuff and scale at home to measure your blood pressure, heart rate and weight prior to your scheduled appointment. Patients with clinical needs that need an in-person evaluation and testing will still be able to come to the office if absolutely necessary. If you have any questions, feel free to call our office. ° ° ° °IF YOU HAVE A SMARTPHONE, PLEASE DOWNLOAD THE WEBEX APP TO YOUR SMARTPHONE ° °- If Apple, go to App Store and type in WebEx in the search bar. Download [employer] Webex Meetings, the blue/green circle. The app is free but as with any other app download, your phone may require you to verify saved payment information or Apple password. You do NOT have to create a WebEx account. ° °- If Android, go to Google Play Store and type in WebEx in the search bar. Download [employer] Webex Meetings, the blue/green circle. The app is free but as with any other app download, your phone may require you to verify saved payment information or Android password. You do NOT have to create a WebEx account. ° °It is very helpful to have this downloaded before your visit. ° ° ° °2-3 DAYS BEFORE YOUR  APPOINTMENT ° °You will receive a telephone call from one of our [facility] team members - your caller ID may say "Unknown caller." If this is a video visit, we will confirm that you have been able to download the WebEx app. We will remind you check your blood pressure, heart rate and weight prior to your scheduled appointment. If you have an Apple Watch or Kardia, please upload any pertinent ECG strips the day before or morning of your appointment to MyChart. Our staff will also make sure you have reviewed the consent and agree to move forward with your scheduled tele-health visit.  ° ° ° °THE DAY OF YOUR APPOINTMENT ° °Approximately 15 minutes prior to your scheduled appointment, you will receive a telephone call from one of [facility] team - your caller ID may say "Unknown caller."  Our staff will confirm medications, vital signs for the day and any symptoms you may be experiencing. Please have this information available prior to the time of visit start. It may also be helpful for you to have a pad of paper and pen handy for any instructions given during your visit. They will also walk you through joining the WebEx smartphone meeting if this is a video visit. ° ° ° °CONSENT FOR TELE-HEALTH VISIT - PLEASE REVIEW ° °I hereby voluntarily request, consent and authorize [facility] and its employed or contracted physicians, physician assistants, nurse practitioners or other licensed health care professionals (the Practitioner), to provide me with telemedicine health   care services (the “Services") as deemed necessary by the treating Practitioner. I acknowledge and consent to receive the Services by the Practitioner via telemedicine. I understand that the telemedicine visit will involve communicating with the Practitioner through live audiovisual communication technology and the disclosure of certain medical information by electronic transmission. I acknowledge that I have been given the opportunity to request an  in-person assessment or other available alternative prior to the telemedicine visit and am voluntarily participating in the telemedicine visit. ° °I understand that I have the right to withhold or withdraw my consent to the use of telemedicine in the course of my care at any time, without affecting my right to future care or treatment, and that the Practitioner or I may terminate the telemedicine visit at any time. I understand that I have the right to inspect all information obtained and/or recorded in the course of the telemedicine visit and may receive copies of available information for a reasonable fee.  I understand that some of the potential risks of receiving the Services via telemedicine include:  °• Delay or interruption in medical evaluation due to technological equipment failure or disruption; °• Information transmitted may not be sufficient (e.g. poor resolution of images) to allow for appropriate medical decision making by the Practitioner; and/or  °• In rare instances, security protocols could fail, causing a breach of personal health information. ° °Furthermore, I acknowledge that it is my responsibility to provide information about my medical history, conditions and care that is complete and accurate to the best of my ability. I acknowledge that Practitioner's advice, recommendations, and/or decision may be based on factors not within their control, such as incomplete or inaccurate data provided by me or distortions of diagnostic images or specimens that may result from electronic transmissions. I understand that the practice of medicine is not an exact science and that Practitioner makes no warranties or guarantees regarding treatment outcomes. I acknowledge that I will receive a copy of this consent concurrently upon execution via email to the email address I last provided but may also request a printed copy by calling the office of [facility].   ° °I understand that my insurance will be  billed for this visit.  ° °I have read or had this consent read to me. °• I understand the contents of this consent, which adequately explains the benefits and risks of the Services being provided via telemedicine.  °• I have been provided ample opportunity to ask questions regarding this consent and the Services and have had my questions answered to my satisfaction. °• I give my informed consent for the services to be provided through the use of telemedicine in my medical care ° °By participating in this telemedicine visit I agree to the above. ° °

## 2018-10-31 ENCOUNTER — Encounter: Payer: Self-pay | Admitting: Cardiovascular Disease

## 2018-10-31 ENCOUNTER — Telehealth (INDEPENDENT_AMBULATORY_CARE_PROVIDER_SITE_OTHER): Payer: Medicare Other | Admitting: Cardiovascular Disease

## 2018-10-31 VITALS — BP 139/78 | HR 81 | Ht 64.5 in | Wt 171.0 lb

## 2018-10-31 DIAGNOSIS — R079 Chest pain, unspecified: Secondary | ICD-10-CM | POA: Diagnosis not present

## 2018-10-31 DIAGNOSIS — I10 Essential (primary) hypertension: Secondary | ICD-10-CM

## 2018-10-31 DIAGNOSIS — K219 Gastro-esophageal reflux disease without esophagitis: Secondary | ICD-10-CM

## 2018-10-31 DIAGNOSIS — E7800 Pure hypercholesterolemia, unspecified: Secondary | ICD-10-CM

## 2018-10-31 NOTE — Patient Instructions (Signed)
Medication Instructions:  Continue all current medications.  Labwork: none  Testing/Procedures: none  Follow-Up: As needed.    Any Other Special Instructions Will Be Listed Below (If Applicable).  If you need a refill on your cardiac medications before your next appointment, please call your pharmacy.  

## 2018-10-31 NOTE — Progress Notes (Signed)
Virtual Visit via Telephone Note   This visit type was conducted due to national recommendations for restrictions regarding the COVID-19 Pandemic (e.g. social distancing) in an effort to limit this patient's exposure and mitigate transmission in our community.  Due to her co-morbid illnesses, this patient is at least at moderate risk for complications without adequate follow up.  This format is felt to be most appropriate for this patient at this time.  The patient did not have access to video technology/had technical difficulties with video requiring transitioning to audio format only (telephone).  All issues noted in this document were discussed and addressed.  No physical exam could be performed with this format.  Please refer to the patient's chart for her  consent to telehealth for [facility].   Date:  10/31/2018   ID:  Tammy Kramer, DOB [birth year], MRN [id number]  Patient Location: Home Provider Location: Home  PCP:  Avon Gully, MD  Cardiologist:  No primary care provider on file.  Electrophysiologist:  None   Evaluation Performed:  Follow-Up Visit  Chief Complaint:  Chest pain  History of Present Illness:    Tammy Kramer is a 68 y.o. female with chest pain.  Now she seldom has chest pains and when they occur they are much less severe. Last night she took Tums which caused symptom resolution. Symptoms start in her back and radiate to the left inframammary region.  She did have a telehealth GI consult recently.   The patient does not have symptoms concerning for COVID-19 infection (fever, chills, cough, or new shortness of breath).   Family history: Her sister, Tammy Kramer, is also my patient.  She has heart disease.  Her father died of CHF.  She has a son with atrial fibrillation.  She has a daughter who lives in [place] and works in [place].   Past Medical History:  Diagnosis Date  . Arthritis   . Back pain   . Chronic constipation   . Complication  of anesthesia    difficulty waking up from anesthesia  . GERD (gastroesophageal reflux disease)   . Hemorrhoids   . Hypercholesterolemia   . Hypertension   . Seasonal allergies    Past Surgical History:  Procedure Laterality Date  . ABDOMINAL HYSTERECTOMY    . BACK SURGERY    . C4-C5 fusion    . CATARACT EXTRACTION W/PHACO Right 05/02/2017   Procedure: CATARACT EXTRACTION PHACO AND INTRAOCULAR LENS PLACEMENT (IOC);  Surgeon: Jethro Bolus, MD;  Location: [facility];  Service: Ophthalmology;  Laterality: Right;  CDE: 5.99  . CATARACT EXTRACTION W/PHACO Left 05/16/2017   Procedure: CATARACT EXTRACTION PHACO AND INTRAOCULAR LENS PLACEMENT (IOC);  Surgeon: Jethro Bolus, MD;  Location: [facility];  Service: Ophthalmology;  Laterality: Left;  CDE: 3.80  . cholecystectomy    . CHOLECYSTECTOMY    . COLONOSCOPY   10/16/2006   NUR: 3 mm polyp ablated via cold biopsy from the splenic flexure.External hemorrhoids, hyperplastic polyp  . COLONOSCOPY  Feb 2011   Dr. Jena Gauss: internal hemorrhoids, otherwise normal.   . COLONOSCOPY N/A 04/08/2014   RMR: Internal hemorrhoids; colonic diverticulosis( few  as described above) hematochezia likely secondary to hemorrhoids in the setting of constipation.  . COLONOSCOPY N/A 12/08/2016   Dr. Jena Gauss: none bleeding internal hemorrhoids which were small. Next colonoscopy in five years.  Marland Kitchen DILATION AND CURETTAGE OF UTERUS     multiple  . ESOPHAGOGASTRODUODENOSCOPY  06/14/2006   NUR: Normal esophagogastroduodenoscopy  . ESOPHAGOGASTRODUODENOSCOPY  Feb  2011   Dr. Jena Gaussourk: inflamed-appearing esophagus with overlying distal esophageal erosions superimposed on noncritical Schatzki ring, s/p dilation and biopsy. Benign path  . ESOPHAGOGASTRODUODENOSCOPY N/A 12/08/2016   Dr. Jena Gaussourk: LA grade A esophagitis, mild schatzki's ring at the GE junction, small hiatal hernia, esophageal dilation performed. Prevacid increased to BID.  . foot surgery     X 5-bone spurs and tendon release;  morton's neuroma.  Marland Kitchen. HAND SURGERY Right    X 2-carpal tunnel release and ganglion cyst removed.  Marland Kitchen. HEMORRHOID BANDING  2016   Dr.Rourk  . KNEE ARTHROSCOPY WITH LATERAL MENISECTOMY Left 03/27/2015   Procedure: KNEE ARTHROSCOPY WITH LATERAL MENISECTOMY;  Surgeon: Vickki HearingStanley E Harrison, MD;  Location: [facility];  Service: Orthopedics;  Laterality: Left;  . ROTATOR CUFF REPAIR Right      Current Meds  Medication Sig  . aspirin EC 81 MG tablet Take 81 mg by mouth daily.  Marland Kitchen. atorvastatin (LIPITOR) 40 MG tablet Take 40 mg by mouth daily.   Marland Kitchen. bismuth subsalicylate (PEPTO BISMOL) 262 MG chewable tablet Chew 524 mg by mouth as needed for indigestion.  . calcium carbonate (TUMS - DOSED IN MG ELEMENTAL CALCIUM) 500 MG chewable tablet Chew 1 tablet by mouth as needed.   . fluticasone (FLONASE) 50 MCG/ACT nasal spray Place 1 spray into both nostrils daily.   Marland Kitchen. ibuprofen (ADVIL,MOTRIN) 200 MG tablet Take 200 mg by mouth every 8 (eight) hours as needed (for pain).   Marland Kitchen. lansoprazole (PREVACID) 30 MG capsule Take 30 mg by mouth every other day. 30 mins before meal  . loratadine (CLARITIN) 10 MG tablet Take 10 mg by mouth daily.  Marland Kitchen. lubiprostone (AMITIZA) 8 MCG capsule Take 1 capsule (8 mcg total) by mouth 2 (two) times daily with a meal.  . Naphazoline HCl (CLEAR EYES OP) Place 1 drop 2 (two) times daily as needed into both eyes (dry eyes).   Marland Kitchen. PROCTOZONE-HC 2.5 % rectal cream USE RECTALLY TWICE DAILY (Patient taking differently: Place 1 application rectally as needed. )  . sucralfate (CARAFATE) 1 g tablet Take 1 tablet (1 g total) by mouth 4 (four) times daily -  with meals and at bedtime. (Patient taking differently: Take 1 g by mouth 4 (four) times daily -  with meals and at bedtime. As needed)  . valsartan-hydrochlorothiazide (DIOVAN-HCT) 160-25 MG tablet Take 1 tablet by mouth daily.     Allergies:   Codeine and Meperidine hcl   Social History   Tobacco Use  . Smoking status: Never Smoker  . Smokeless  tobacco: Never Used  . Tobacco comment: Never smoked  Substance Use Topics  . Alcohol use: No  . Drug use: No     Family Hx: The patient's family history includes Aneurysm in her mother; Colon cancer in her brother; Diabetes in her mother; Heart disease in her father and son; Hypertension in her mother.  ROS:   Please see the history of present illness.     All other systems reviewed and are negative.   Prior CV studies:   The following studies were reviewed today:  Nuclear stress test 08/01/18:   Blood pressure demonstrated a hypertensive response to exercise.  There was no ST segment deviation noted during stress.  The study is normal. There are no perfusion defects  This is a low risk study.  The left ventricular ejection fraction is hyperdynamic (>65%).  Elevated TID of 1.5 is nonspecific finding in absence of any perfusion defect  Echocardiogram 08/01/18:   1.  The left ventricle has normal systolic function with an ejection fraction of 60-65%. The cavity size was normal. There is mildly increased left ventricular wall thickness. Left ventricular diastolic Doppler parameters are consistent with impaired  relaxation Elevated mean left atrial pressure.  2. The right ventricle has normal systolic function. The cavity was normal. There is no increase in right ventricular wall thickness.  3. Small pericardial effusion.  4. The mitral valve is normal in structure. No evidence of mitral valve stenosis.  5. The tricuspid valve is normal in structure.  6. The aortic valve is tricuspid.  7. The pulmonic valve was grossly normal. Pulmonic valve regurgitation is mild by color flow Doppler.  8. The aortic root is normal in size and structure.  9. Pulmonary hypertension is Inadequate TR jet, cannot assess PASP. 10. Right atrial pressure is estimated at 3 mmHg.    Labs/Other Tests and Data Reviewed:    EKG:  No ECG reviewed.  Recent Labs: 07/12/2018: BUN 14; Creatinine, Ser  1.06; Hemoglobin 13.1; Platelets 325; Potassium 3.3; Sodium 140   Recent Lipid Panel No results found for: CHOL, TRIG, HDL, CHOLHDL, LDLCALC, LDLDIRECT  Wt Readings from Last 3 Encounters:  10/31/18 171 lb (77.6 kg)  07/24/18 167 lb (75.8 kg)  07/12/18 166 lb (75.3 kg)     Objective:    Vital Signs:  BP 139/78   Pulse 81   Ht 5' 4.5" (1.638 m)   Wt 171 lb (77.6 kg)   BMI 28.90 kg/m    VITAL SIGNS:  reviewed  ASSESSMENT & PLAN:    1.  Chest pain: Now seldom in occurrence and much less severe, and alleviated with Tums (likely GI in etiology). Low risk nuclear stress test. Hypertensive response to exercise. Echocardiogram showed normal LV systolic function and grade 1 diastolic dysfunction. No further cardiac testing is indicated. She follows with GI.  2.  Hypertension: Blood pressure is borderline elevated. I spoke to her about the importance of routine monitoring.  3.  Hyperlipidemia: Followed by PCP.  Currently on atorvastatin 40 mg daily.  4.  GERD: She is on multiple medications for this.  She follows with GI.    COVID-19 Education: The signs and symptoms of COVID-19 were discussed with the patient and how to seek care for testing (follow up with PCP or arrange E-visit).  The importance of social distancing was discussed today.  Time:   Today, I have spent 15 minutes with the patient with telehealth technology discussing the above problems.     Medication Adjustments/Labs and Tests Ordered: Current medicines are reviewed at length with the patient today.  Concerns regarding medicines are outlined above.   Tests Ordered: No orders of the defined types were placed in this encounter.   Medication Changes: No orders of the defined types were placed in this encounter.   Disposition:  Follow up prn  Signed, Prentice Docker, MD  10/31/2018 1:39 PM    [facility]

## 2018-12-25 ENCOUNTER — Encounter: Payer: Medicare Other | Admitting: Gastroenterology

## 2019-02-01 DIAGNOSIS — I10 Essential (primary) hypertension: Secondary | ICD-10-CM | POA: Diagnosis not present

## 2019-02-01 DIAGNOSIS — Z0001 Encounter for general adult medical examination with abnormal findings: Secondary | ICD-10-CM | POA: Diagnosis not present

## 2019-02-01 DIAGNOSIS — M159 Polyosteoarthritis, unspecified: Secondary | ICD-10-CM | POA: Diagnosis not present

## 2019-02-01 DIAGNOSIS — K219 Gastro-esophageal reflux disease without esophagitis: Secondary | ICD-10-CM | POA: Diagnosis not present

## 2019-02-01 DIAGNOSIS — Z1389 Encounter for screening for other disorder: Secondary | ICD-10-CM | POA: Diagnosis not present

## 2019-02-04 ENCOUNTER — Encounter: Payer: Self-pay | Admitting: Gastroenterology

## 2019-02-04 ENCOUNTER — Ambulatory Visit (INDEPENDENT_AMBULATORY_CARE_PROVIDER_SITE_OTHER): Payer: Medicare Other | Admitting: Gastroenterology

## 2019-02-04 ENCOUNTER — Other Ambulatory Visit: Payer: Self-pay

## 2019-02-04 VITALS — BP 139/74 | HR 111 | Temp 97.0°F | Ht 64.5 in | Wt 179.2 lb

## 2019-02-04 DIAGNOSIS — Z0001 Encounter for general adult medical examination with abnormal findings: Secondary | ICD-10-CM | POA: Diagnosis not present

## 2019-02-04 DIAGNOSIS — I10 Essential (primary) hypertension: Secondary | ICD-10-CM | POA: Diagnosis not present

## 2019-02-04 DIAGNOSIS — K642 Third degree hemorrhoids: Secondary | ICD-10-CM

## 2019-02-04 NOTE — Patient Instructions (Signed)
Avoid straining. Continue Amitiza twice a day with food. Let's add Benefiber to this regimen as well. Take 1 teaspoon daily and increase to three teaspoons a day. Let me know how this works.   Limit toilet time to 2-3 minutes  Call me with any interim problems  I will see you in a few weeks to do a repeat banding if needed!  I enjoyed seeing you again today! As you know, I value our relationship and want to provide genuine, compassionate, and quality care. I welcome your feedback. If you receive a survey regarding your visit,  I greatly appreciate you taking time to fill this out. See you next time!  Annitta Needs, PhD, ANP-BC [facility] Gastroenterology

## 2019-02-04 NOTE — Progress Notes (Signed)
[facility] Banding Note:   68 year old delightful female with history of internal hemorrhoids s/p remote banding in 2016, returning with recurrent symptomatic Grade 3 hemorrhoids. Last colonoscopy in 2018. Anoscopy performed earlier this year with evidence of internal hemorrhoids and most prominent in left lateral position. Due to COVID, banding had been postponed.   The patient presents today with symptomatic grade 3 hemorrhoids, unresponsive to maximal medical therapy, requesting rubber band ligation of her hemorrhoidal disease. All risks, benefits, and alternative forms of therapy were described and informed consent was obtained  The decision was made to band the left lateral  internal hemorrhoid, and the [place] was used to perform band ligation without complication. Digital anorectal examination was then performed to assure proper positioning of the band, and no adjustment was required. The patient was discharged home without pain or other issues. Dietary and behavioral recommendations were given, including addition of Benefiber to daily regimen, continuing Amitiza 8 mcg BID,  along with follow-up instructions. The patient will return in 2-3 weeks for followup and possible additional banding as required.  No complications were encountered and the patient tolerated the procedure well.  Annitta Needs, PhD, ANP-BC [facility] Gastroenterology

## 2019-03-04 DIAGNOSIS — I10 Essential (primary) hypertension: Secondary | ICD-10-CM | POA: Diagnosis not present

## 2019-03-04 DIAGNOSIS — E785 Hyperlipidemia, unspecified: Secondary | ICD-10-CM | POA: Diagnosis not present

## 2019-03-05 ENCOUNTER — Other Ambulatory Visit: Payer: Self-pay

## 2019-03-05 ENCOUNTER — Ambulatory Visit (INDEPENDENT_AMBULATORY_CARE_PROVIDER_SITE_OTHER): Payer: Medicare Other | Admitting: Gastroenterology

## 2019-03-05 ENCOUNTER — Encounter: Payer: Self-pay | Admitting: Gastroenterology

## 2019-03-05 VITALS — BP 131/80 | HR 90 | Temp 97.1°F | Ht 65.0 in | Wt 180.4 lb

## 2019-03-05 DIAGNOSIS — K641 Second degree hemorrhoids: Secondary | ICD-10-CM | POA: Diagnosis not present

## 2019-03-05 DIAGNOSIS — Z23 Encounter for immunization: Secondary | ICD-10-CM | POA: Diagnosis not present

## 2019-03-05 NOTE — Patient Instructions (Signed)
Please increase Amitiza to the 24 microgram gelcap twice a day with food. Call if you need a prescription!  We will see you in 2-3 weeks.  Continue to avoid straining, and limit toilet time to 2-3 minutes.  I enjoyed seeing you again today! As you know, I value our relationship and want to provide genuine, compassionate, and quality care. I welcome your feedback. If you receive a survey regarding your visit,  I greatly appreciate you taking time to fill this out. See you next time!  Annitta Needs, PhD, ANP-BC [facility] Gastroenterology

## 2019-03-05 NOTE — Progress Notes (Signed)
[facility] Banding Note:   68 year old female with history of internal hemorrhoids s/p remote banding in 2016, returning with recurrent symptomatic Grade 3 hemorrhoids. Last colonoscopy in 2018. Anoscopy performed earlier this year with evidence of internal hemorrhoids and most prominent in left lateral position. Due to COVID, banding had been postponed. She had her first session Aug 2020 of left lateral. She still has pressure and prolapsing hemorrhoids that resolve on own. She feels this most prominently towards right posterior rectum when asked to describe.   The patient presents with symptomatic grade 2 hemorrhoids, unresponsive to maximal medical therapy, requesting rubber band ligation of his/her hemorrhoidal disease. All risks, benefits, and alternative forms of therapy were described and informed consent was obtained.  The decision was made to band the right posterior  internal hemorrhoid, and the [place] was used to perform band ligation without complication. Digital anorectal examination was then performed to assure proper positioning of the band, and to adjust the banded tissue as required. The patient was discharged home without pain or other issues. Dietary and behavioral recommendations were given, along with follow-up instructions. The patient will return in 2-3 weeks for followup and possible additional banding as required.  No complications were encountered and the patient tolerated the procedure well.  Annitta Needs, PhD, ANP-BC [facility] Gastroenterology

## 2019-03-11 ENCOUNTER — Ambulatory Visit: Payer: Medicare Other

## 2019-03-11 ENCOUNTER — Ambulatory Visit (INDEPENDENT_AMBULATORY_CARE_PROVIDER_SITE_OTHER): Payer: Medicare Other | Admitting: Orthopedic Surgery

## 2019-03-11 ENCOUNTER — Encounter

## 2019-03-11 ENCOUNTER — Encounter: Payer: Self-pay | Admitting: Orthopedic Surgery

## 2019-03-11 ENCOUNTER — Telehealth: Payer: Self-pay | Admitting: Internal Medicine

## 2019-03-11 ENCOUNTER — Other Ambulatory Visit: Payer: Self-pay

## 2019-03-11 VITALS — BP 149/73 | HR 108 | Ht 65.0 in | Wt 180.0 lb

## 2019-03-11 DIAGNOSIS — G8929 Other chronic pain: Secondary | ICD-10-CM

## 2019-03-11 DIAGNOSIS — M25562 Pain in left knee: Secondary | ICD-10-CM

## 2019-03-11 DIAGNOSIS — M519 Unspecified thoracic, thoracolumbar and lumbosacral intervertebral disc disorder: Secondary | ICD-10-CM | POA: Insufficient documentation

## 2019-03-11 NOTE — Telephone Encounter (Signed)
Noted. If severe pain, call us. Otherwise, do sitz baths, apply cream BID, avoid straining, and call us if anything changes.

## 2019-03-11 NOTE — Telephone Encounter (Signed)
Pt had hemorrhoid banding last week and is finding pieces of the rubber band when she uses the bathroom. She said she was also hurting. Please advise and call her at [phone]

## 2019-03-11 NOTE — Telephone Encounter (Signed)
AB, you got a cancellation on your schedule for this Thursday after sending the previous message. Pt was scheduled for this Thursday @ 8:30 with you.

## 2019-03-11 NOTE — Telephone Encounter (Signed)
Noted. Pt is aware 

## 2019-03-11 NOTE — Progress Notes (Signed)
Tammy Kramer  03/11/2019  HISTORY SECTION :  Chief Complaint  Patient presents with  . Knee Pain    left painful and swollen    HPI The patient presents for evaluation of recurrent left knee pain.  This is a 68 year old female who had a knee arthroscopy for lateral meniscal tear and arthritis approximately 5 years ago.  About 2 weeks ago she had acute pain and swelling left knee which has since improved with ice rest topical Voltaren Tylenol and ibuprofen and she now presents with no symptoms normal gait no locking catching or giving way  Review of Systems  Constitutional: Negative.   Musculoskeletal: Positive for joint pain. Negative for back pain and falls.  Skin: Negative.   Neurological: Negative for tingling and tremors.     has a past medical history of Arthritis, Back pain, Chronic constipation, Complication of anesthesia, GERD (gastroesophageal reflux disease), Hemorrhoids, Hypercholesterolemia, Hypertension, and Seasonal allergies.   Past Surgical History:  Procedure Laterality Date  . ABDOMINAL HYSTERECTOMY    . BACK SURGERY    . C4-C5 fusion    . CATARACT EXTRACTION W/PHACO Right 05/02/2017   Procedure: CATARACT EXTRACTION PHACO AND INTRAOCULAR LENS PLACEMENT (IOC);  Surgeon: Rutherford Guys, MD;  Location: [facility];  Service: Ophthalmology;  Laterality: Right;  CDE: 5.99  . CATARACT EXTRACTION W/PHACO Left 05/16/2017   Procedure: CATARACT EXTRACTION PHACO AND INTRAOCULAR LENS PLACEMENT (IOC);  Surgeon: Rutherford Guys, MD;  Location: [facility];  Service: Ophthalmology;  Laterality: Left;  CDE: 3.80  . cholecystectomy    . CHOLECYSTECTOMY    . COLONOSCOPY   10/16/2006   NUR: 3 mm polyp ablated via cold biopsy from the splenic flexure.External hemorrhoids, hyperplastic polyp  . COLONOSCOPY  Feb 2011   Dr. Gala Romney: internal hemorrhoids, otherwise normal.   . COLONOSCOPY N/A 04/08/2014   RMR: Internal hemorrhoids; colonic diverticulosis( few  as described above) hematochezia  likely secondary to hemorrhoids in the setting of constipation.  . COLONOSCOPY N/A 12/08/2016   Dr. Gala Romney: none bleeding internal hemorrhoids which were small. Next colonoscopy in five years.  Marland Kitchen DILATION AND CURETTAGE OF UTERUS     multiple  . ESOPHAGOGASTRODUODENOSCOPY  06/14/2006   NUR: Normal esophagogastroduodenoscopy  . ESOPHAGOGASTRODUODENOSCOPY  Feb 2011   Dr. Gala Romney: inflamed-appearing esophagus with overlying distal esophageal erosions superimposed on noncritical Schatzki ring, s/p dilation and biopsy. Benign path  . ESOPHAGOGASTRODUODENOSCOPY N/A 12/08/2016   Dr. Gala Romney: LA grade A esophagitis, mild schatzki's ring at the GE junction, small hiatal hernia, esophageal dilation performed. Prevacid increased to BID.  . foot surgery     X 5-bone spurs and tendon release; morton's neuroma.  Marland Kitchen HAND SURGERY Right    X 2-carpal tunnel release and ganglion cyst removed.  Marland Kitchen HEMORRHOID BANDING  2016   Dr.Rourk  . KNEE ARTHROSCOPY WITH LATERAL MENISECTOMY Left 03/27/2015   Procedure: KNEE ARTHROSCOPY WITH LATERAL MENISECTOMY;  Surgeon: Carole Civil, MD;  Location: [facility];  Service: Orthopedics;  Laterality: Left;  . ROTATOR CUFF REPAIR Right     Body mass index is 29.95 kg/m.   Allergies  Allergen Reactions  . Codeine Other (See Comments)    Hallucinations/dizziness/"out of it"  . Meperidine Hcl Other (See Comments)    Dizziness/headaches/hallucinations     Current Outpatient Medications:  .  aspirin EC 81 MG tablet, Take 81 mg by mouth daily., Disp: , Rfl:  .  atorvastatin (LIPITOR) 40 MG tablet, Take 40 mg by mouth daily. , Disp: , Rfl:  .  bismuth subsalicylate (PEPTO BISMOL) 262 MG chewable tablet, Chew 524 mg by mouth as needed for indigestion., Disp: , Rfl:  .  calcium carbonate (TUMS - DOSED IN MG ELEMENTAL CALCIUM) 500 MG chewable tablet, Chew 1 tablet by mouth as needed. , Disp: , Rfl:  .  fluticasone (FLONASE) 50 MCG/ACT nasal spray, Place 1 spray into both nostrils  daily. , Disp: , Rfl: 3 .  ibuprofen (ADVIL,MOTRIN) 200 MG tablet, Take 200 mg by mouth every 8 (eight) hours as needed (for pain). , Disp: , Rfl:  .  lansoprazole (PREVACID) 30 MG capsule, Take 30 mg by mouth every other day. 30 mins before meal, Disp: , Rfl:  .  loratadine (CLARITIN) 10 MG tablet, Take 10 mg by mouth daily., Disp: , Rfl:  .  lubiprostone (AMITIZA) 8 MCG capsule, Take 1 capsule (8 mcg total) by mouth 2 (two) times daily with a meal., Disp: 60 capsule, Rfl: 5 .  Naphazoline HCl (CLEAR EYES OP), Place 1 drop 2 (two) times daily as needed into both eyes (dry eyes). , Disp: , Rfl:  .  Polyethylene Glycol 3350 (CLEARLAX PO), Take by mouth daily., Disp: , Rfl:  .  PROCTOZONE-HC 2.5 % rectal cream, USE RECTALLY TWICE DAILY (Patient taking differently: Place 1 application rectally as needed. ), Disp: 30 g, Rfl: 0 .  sucralfate (CARAFATE) 1 g tablet, Take 1 tablet (1 g total) by mouth 4 (four) times daily -  with meals and at bedtime. (Patient taking differently: Take 1 g by mouth 4 (four) times daily -  with meals and at bedtime. As needed), Disp: 21 tablet, Rfl: 0 .  valsartan-hydrochlorothiazide (DIOVAN-HCT) 160-25 MG tablet, Take 1 tablet by mouth daily., Disp: , Rfl:    PHYSICAL EXAM SECTION: 1) BP (!) 149/73   Pulse (!) 108   Ht 5\' 5"  (1.651 m)   Wt 180 lb (81.6 kg)   BMI 29.95 kg/m   Body mass index is 29.95 kg/m. General appearance: Well-developed well-nourished no gross deformities  2) Cardiovascular normal pulse and perfusion in the lower extremities normal color without edema  3) Neurologically deep tendon reflexes are equal and normal, no sensation loss or deficits no pathologic reflexes  4) Psychological: Awake alert and oriented x3 mood and affect normal  5) Skin no lacerations or ulcerations no nodularity no palpable masses, no erythema or nodularity  6) Musculoskeletal:   Left knee appears to be in slight valgus alignment there is no tenderness or swelling of  the joint she has full range of motion there is no ligamentous instability muscle tone is normal.  Meniscal signs are negative.  Skin shows that the arthroscopy portals are healed  Right knee is asymptomatic.  No tenderness elicited.  Range of motion is normal muscle strength and tone normal no tremor   MEDICAL DECISION SECTION:  Encounter Diagnosis  Name Primary?  . Chronic pain of left knee Yes    Imaging Left knee xrays in the office mild OA   Plan:  (Rx., Inj., surg., Frx, MRI/CT, XR:2) OTC meds as needed   4:32 PM , MD  03/11/2019

## 2019-03-11 NOTE — Telephone Encounter (Signed)
The banding was done 9/15. She should not still be feeling the band, as it falls off after 2-3 days. Most likely feeling prolapsing hemorrhoidal tissue that she is feeling. Decrease Amitiza to once per day for now, because I don't want her having loose stools. Is she able to manually reduce the hemorrhoids?   We need to get her in for an office visit this week (whomever is available). If needed, I can switch slots with whomever sees her so I can reassess.

## 2019-03-11 NOTE — Telephone Encounter (Signed)
Spoke with pt. She isn't able to manually push the hemorrhoids back up. Pt would prefer to see AB only about the hemorrhoids. AB, there isn't an opening on your schedule, please advise.

## 2019-03-11 NOTE — Telephone Encounter (Signed)
Spoke with pt. She increased her Amitiza to 24 mcg bid as directed the day after her banding last week. Pt is having 2-4 bowel movements daily since she started Amitiza 24 mcg. When pt started having bowel movements, she felt the hemorrhoids drop on one side. hemrrhoids are on the outside of rectum and will not go back up. Pt  did feel the rubber band and says when she wipes, it's a light red spots that's seen on the tissue. Marland Kitchen Pt is using a otc topical cool hemorrhoid and taking warm baths. The pain gets a little better but starts back up.

## 2019-03-14 ENCOUNTER — Telehealth: Payer: Self-pay | Admitting: Internal Medicine

## 2019-03-14 ENCOUNTER — Ambulatory Visit: Payer: Medicare Other | Admitting: Gastroenterology

## 2019-03-14 ENCOUNTER — Encounter: Payer: Self-pay | Admitting: Internal Medicine

## 2019-03-14 NOTE — Telephone Encounter (Signed)
PATIENT WAS A NO SHOW AND LETTER SENT  °

## 2019-03-29 ENCOUNTER — Other Ambulatory Visit: Payer: Self-pay

## 2019-03-29 ENCOUNTER — Ambulatory Visit: Payer: Medicare Other | Admitting: Gastroenterology

## 2019-03-29 ENCOUNTER — Encounter: Payer: Self-pay | Admitting: Gastroenterology

## 2019-03-29 VITALS — BP 128/80 | HR 97 | Temp 98.2°F | Ht 65.0 in | Wt 182.4 lb

## 2019-03-29 DIAGNOSIS — K642 Third degree hemorrhoids: Secondary | ICD-10-CM

## 2019-03-29 MED ORDER — HYDROCORTISONE (PERIANAL) 2.5 % EX CREA: 1.0000 "application " | TOPICAL_CREAM | Freq: Two times a day (BID) | CUTANEOUS | 1 refills | Status: DC

## 2019-03-29 NOTE — Patient Instructions (Signed)
I have sent in a cream to use per rectum twice a day for the next 5-7 days. You can use lidocaine ointment over the counter if needed.  Please avoid straining as you are doing. Call me with any issues!  We will see you back in 2 weeks!  I enjoyed seeing you again today! As you know, I value our relationship and want to provide genuine, compassionate, and quality care. I welcome your feedback. If you receive a survey regarding your visit,  I greatly appreciate you taking time to fill this out. See you next time!  Annitta Needs, PhD, ANP-BC [facility] Gastroenterology

## 2019-03-29 NOTE — Progress Notes (Signed)
[facility] Banding Note:   68 year old female with history of internal hemorrhoids s/p remote banding in 2016, returning with recurrent symptomatic Grade 3 hemorrhoids. Last colonoscopy in 2018. Anoscopy performed earlier this year with evidence of internal hemorrhoids and most prominent in left lateral position. Due to COVID, banding had been postponed.She had her first session Aug 2020 of left lateral. Most recently Sept 2020 with right posterior. In interim from last appt, had flare with what clinically sounds like left lateral hemorrhoid. Examined today and able to reduce digitally. No concern for anal fissure. Will band left lateral again, as this has been the most troublesome.   The patient presents with symptomatic grade 3 hemorrhoids, unresponsive to maximal medical therapy, requesting rubber band ligation of her hemorrhoidal disease. All risks, benefits, and alternative forms of therapy were described and informed consent was obtained.   The decision was made to band the left lateral internal hemorrhoid, and the [place] was used to perform band ligation without complication. Digital anorectal examination was then performed to assure proper positioning of the band, and no adjustment was needed. The patient was discharged home without pain or other issues. Dietary and behavioral recommendations were given. The patient will return in 2 weeks for followup and possible additional banding as required.  No complications were encountered and the patient tolerated the procedure well  Annitta Needs, PhD, [facility] Gastroenterology

## 2019-04-04 DIAGNOSIS — E785 Hyperlipidemia, unspecified: Secondary | ICD-10-CM | POA: Diagnosis not present

## 2019-04-04 DIAGNOSIS — K219 Gastro-esophageal reflux disease without esophagitis: Secondary | ICD-10-CM | POA: Diagnosis not present

## 2019-04-11 ENCOUNTER — Ambulatory Visit: Payer: Medicare Other | Admitting: Gastroenterology

## 2019-04-11 ENCOUNTER — Other Ambulatory Visit: Payer: Self-pay

## 2019-04-11 ENCOUNTER — Encounter: Payer: Self-pay | Admitting: Gastroenterology

## 2019-04-11 VITALS — BP 131/76 | HR 77 | Temp 96.2°F | Ht 65.0 in | Wt 180.8 lb

## 2019-04-11 DIAGNOSIS — K642 Third degree hemorrhoids: Secondary | ICD-10-CM | POA: Insufficient documentation

## 2019-04-11 NOTE — Patient Instructions (Signed)
Continue to use supplemental fiber daily.  You can try the Linzess, and let me know which dosage you are doing! We can titrate it up or down if needed.  I will see you in 2-3 weeks!  I will be thinking of you as we go into the holiday season.  I enjoyed seeing you again today! As you know, I value our relationship and want to provide genuine, compassionate, and quality care. I welcome your feedback. If you receive a survey regarding your visit,  I greatly appreciate you taking time to fill this out. See you next time!  Annitta Needs, PhD, ANP-BC [facility] Gastroenterology

## 2019-04-11 NOTE — Progress Notes (Signed)
[facility] BANDING NOTE:    68 year old female with history ofinternal hemorrhoids s/p remote banding in 2016, returning with recurrent symptomatic Grade 3 hemorrhoids. Last colonoscopy in 2018. Anoscopy performed earlier this year with evidence of internal hemorrhoids and most prominent in left lateral position. Due to COVID, banding had been postponed.She had her first session Aug 2020 of left lateral, second session Sept 2020 with right posterior. In [place] had flare with what clinically sounded like left lateral hemorrhoid. Examined at time of appointment a few weeks ago and able to reduce digitally without issues. Decision was made to repeat banding of left lateral as this had been bothersome. Today she notes some improvement. Still with some itching.    The patient presents with symptomatic grade 2-3 hemorrhoids, unresponsive to maximal medical therapy, requesting rubber band ligation of her hemorrhoidal disease. All risks, benefits, and alternative forms of therapy were described and informed consent was obtained.  Rectal exam with skin tags. Much improvement from last exam without any prolapsing tissue. The decision was made to band the right anterior internal hemorrhoid, and the [place] was used to perform band ligation without complication. Digital anorectal examination was then performed to assure proper positioning of the band, and no adjustment was required. The patient was discharged home without pain or other issues. Dietary and behavioral recommendations were given. She will trial Linzess samples at home (she is to call with dosage that she has) instead of Amitiza, as it sounds this is causing cramping. The patient will return in 2-3 weeks for followup and possible additional banding as required. I anticipate possibly one final neutral position banding to achieve resolution of symptoms.   No complications were encountered and the patient tolerated the procedure well.  Annitta Needs, PhD, ANP-BC [facility] Gastroenterology

## 2019-04-29 NOTE — Progress Notes (Signed)
[facility] Banding Note:    68 year old female with history ofinternal hemorrhoids s/p remote banding in 2016, returning with recurrent symptomatic Grade 3 hemorrhoids.Last colonoscopy in 2018. Anoscopy performed earlier this year with evidence of internal hemorrhoids and most prominent in left lateral position. Due to COVID, banding had been postponed.She had her first session Aug 2020 of left lateral, second session Sept 2020 with right posterior.In [place] had flare with what clinically sounded like left lateral hemorrhoid. Examined at time of appointment a few weeks ago and able to reduce digitally without issues. Decision was made to repeat banding of left lateral as this had been bothersome. Right anterior banded at last session several weeks ago. Returning today for possible additional banding in neutral position, as all columns have been banded.   The patient presents with symptomatic grade 3 hemorrhoids, unresponsive to maximal medical therapy, requesting rubber band ligation of her hemorrhoidal disease. All risks, benefits, and alternative forms of therapy were described and informed consent was obtained. She notes improvement overall, still with some itching and burning at times.   External exam with hemorrhoid tags, slight prolapsing tissue easily reduced in left lateral position.  [place] was used to perform band ligation without complication in the neutral positon. Digital anorectal examination was then performed to assure proper positioning of the band, and no adjustment as required. The patient was discharged home without pain or other issues. Dietary and behavioral recommendations were given,along with follow-up instructions. I have asked her to increase fiber intake. Amitiza 24 mcg once daily still without ideal productive stool output, and BID dosing is too strong. Will trial Linzess 290 mcg daily. Call with update.   The patient will return in 2 months for routine office visit.    No complications were encountered and the patient tolerated the procedure well.   Annitta Needs, PhD, ANP-BC [facility] Gastroenterology

## 2019-04-30 ENCOUNTER — Encounter: Payer: Self-pay | Admitting: Gastroenterology

## 2019-04-30 ENCOUNTER — Other Ambulatory Visit: Payer: Self-pay

## 2019-04-30 ENCOUNTER — Ambulatory Visit (INDEPENDENT_AMBULATORY_CARE_PROVIDER_SITE_OTHER): Payer: Medicare Other | Admitting: Gastroenterology

## 2019-04-30 VITALS — BP 127/81 | HR 89 | Temp 97.6°F | Ht 65.0 in | Wt 179.8 lb

## 2019-04-30 DIAGNOSIS — K642 Third degree hemorrhoids: Secondary | ICD-10-CM | POA: Diagnosis not present

## 2019-04-30 NOTE — Patient Instructions (Signed)
I have given samples of Linzess to try once each morning, 30 minutes before breakfast. Let me know how this works for you!  You can also increase the fiber daily.   We will see you in 2 months or sooner if needed!   I enjoyed seeing you again today! As you know, I value our relationship and want to provide genuine, compassionate, and quality care. I welcome your feedback. If you receive a survey regarding your visit,  I greatly appreciate you taking time to fill this out. See you next time!  Annitta Needs, PhD, ANP-BC [facility] Gastroenterology

## 2019-05-05 DIAGNOSIS — K219 Gastro-esophageal reflux disease without esophagitis: Secondary | ICD-10-CM | POA: Diagnosis not present

## 2019-05-05 DIAGNOSIS — I10 Essential (primary) hypertension: Secondary | ICD-10-CM | POA: Diagnosis not present

## 2019-05-15 ENCOUNTER — Other Ambulatory Visit: Payer: Self-pay

## 2019-05-15 NOTE — Patient Outreach (Signed)
[facility]) Care Management  05/15/2019  Tammy Kramer 1951/01/02 [id number]   Medication Adherence call to Mr. Tammy Kramer patient did not answer,patient is past due on Atorvastatin 40 mg under [place].   [place] Management Direct Dial [phone]  Fax [phone] [email or web address]

## 2019-05-23 ENCOUNTER — Other Ambulatory Visit: Payer: Self-pay

## 2019-05-23 NOTE — Patient Outreach (Signed)
[facility]) Care Management  05/23/2019  ORRA NOLDE 27-Mar-1951 [id number]   Medication Adherence call to Mrs. Eleonore Chiquito Telephone call to Patient regarding Medication Adherence unable to reach patient. Mrs. Boster is showing past due on Atorvastatin 40 mg under [place].   [place] Management Direct Dial [phone]  Fax [phone] [email or web address]

## 2019-06-04 DIAGNOSIS — K219 Gastro-esophageal reflux disease without esophagitis: Secondary | ICD-10-CM | POA: Diagnosis not present

## 2019-06-04 DIAGNOSIS — I10 Essential (primary) hypertension: Secondary | ICD-10-CM | POA: Diagnosis not present

## 2019-06-24 ENCOUNTER — Other Ambulatory Visit (HOSPITAL_COMMUNITY): Payer: Self-pay | Admitting: Internal Medicine

## 2019-06-24 DIAGNOSIS — Z1231 Encounter for screening mammogram for malignant neoplasm of breast: Secondary | ICD-10-CM

## 2019-06-28 ENCOUNTER — Other Ambulatory Visit (HOSPITAL_COMMUNITY): Payer: Self-pay | Admitting: Internal Medicine

## 2019-06-28 DIAGNOSIS — Z780 Asymptomatic menopausal state: Secondary | ICD-10-CM

## 2019-06-28 DIAGNOSIS — I10 Essential (primary) hypertension: Secondary | ICD-10-CM | POA: Diagnosis not present

## 2019-06-28 DIAGNOSIS — E785 Hyperlipidemia, unspecified: Secondary | ICD-10-CM | POA: Diagnosis not present

## 2019-06-28 DIAGNOSIS — K219 Gastro-esophageal reflux disease without esophagitis: Secondary | ICD-10-CM | POA: Diagnosis not present

## 2019-06-28 DIAGNOSIS — Z0001 Encounter for general adult medical examination with abnormal findings: Secondary | ICD-10-CM | POA: Diagnosis not present

## 2019-06-28 DIAGNOSIS — Z1389 Encounter for screening for other disorder: Secondary | ICD-10-CM | POA: Diagnosis not present

## 2019-06-28 DIAGNOSIS — F17210 Nicotine dependence, cigarettes, uncomplicated: Secondary | ICD-10-CM | POA: Diagnosis not present

## 2019-07-05 ENCOUNTER — Other Ambulatory Visit (HOSPITAL_COMMUNITY): Payer: Medicare Other

## 2019-07-08 NOTE — Progress Notes (Signed)
Referring Provider: Rosita Fire, MD Primary Care Physician:  Rosita Fire, MD  Primary GI: Dr. Gala Romney   Chief Complaint  Patient presents with  . Hemorrhoids  . Constipation    so far with taking metamucil BM's are QD    HPI:   Tammy Kramer is a 69 y.o. female presenting today with a history of GERD constipation, recently undergoing outpatient hemorrhoid banding of Grade 3 hemorrhoids. Has had left lateral, right posterior, repeat banding of left lateral, and right anterior, with final session in neutral position.   Hemorrhoid symptoms much improved. Taking Metamucil once daily. Amitiza 24 mcg daily. If takes the Metamucil and Amitiza too close together, will get nauseated. Taking Amitiza with food. No rectal bleeding.   GERD: Taking Prevacid as needed. No solid food dysphagia. Tums as needed. Drinking more water.   Past Medical History:  Diagnosis Date  . Arthritis   . Back pain   . Chronic constipation   . Complication of anesthesia    difficulty waking up from anesthesia  . GERD (gastroesophageal reflux disease)   . Hemorrhoids   . Hypercholesterolemia   . Hypertension   . Seasonal allergies     Past Surgical History:  Procedure Laterality Date  . ABDOMINAL HYSTERECTOMY    . BACK SURGERY    . C4-C5 fusion    . CATARACT EXTRACTION W/PHACO Right 05/02/2017   Procedure: CATARACT EXTRACTION PHACO AND INTRAOCULAR LENS PLACEMENT (IOC);  Surgeon: Rutherford Guys, MD;  Location: [facility];  Service: Ophthalmology;  Laterality: Right;  CDE: 5.99  . CATARACT EXTRACTION W/PHACO Left 05/16/2017   Procedure: CATARACT EXTRACTION PHACO AND INTRAOCULAR LENS PLACEMENT (IOC);  Surgeon: Rutherford Guys, MD;  Location: [facility];  Service: Ophthalmology;  Laterality: Left;  CDE: 3.80  . cholecystectomy    . CHOLECYSTECTOMY    . COLONOSCOPY   10/16/2006   NUR: 3 mm polyp ablated via cold biopsy from the splenic flexure.External hemorrhoids, hyperplastic polyp  . COLONOSCOPY  Feb 2011   Dr. Gala Romney: internal hemorrhoids, otherwise normal.   . COLONOSCOPY N/A 04/08/2014   RMR: Internal hemorrhoids; colonic diverticulosis( few  as described above) hematochezia likely secondary to hemorrhoids in the setting of constipation.  . COLONOSCOPY N/A 12/08/2016   Dr. Gala Romney: none bleeding internal hemorrhoids which were small. Next colonoscopy in five years.  Marland Kitchen DILATION AND CURETTAGE OF UTERUS     multiple  . ESOPHAGOGASTRODUODENOSCOPY  06/14/2006   NUR: Normal esophagogastroduodenoscopy  . ESOPHAGOGASTRODUODENOSCOPY  Feb 2011   Dr. Gala Romney: inflamed-appearing esophagus with overlying distal esophageal erosions superimposed on noncritical Schatzki ring, s/p dilation and biopsy. Benign path  . ESOPHAGOGASTRODUODENOSCOPY N/A 12/08/2016   Dr. Gala Romney: LA grade A esophagitis, mild schatzki's ring at the GE junction, small hiatal hernia, esophageal dilation performed. Prevacid increased to BID.  . foot surgery     X 5-bone spurs and tendon release; morton's neuroma.  Marland Kitchen HAND SURGERY Right    X 2-carpal tunnel release and ganglion cyst removed.  Marland Kitchen HEMORRHOID BANDING  2016   Dr.Rourk  . KNEE ARTHROSCOPY WITH LATERAL MENISECTOMY Left 03/27/2015   Procedure: KNEE ARTHROSCOPY WITH LATERAL MENISECTOMY;  Surgeon: Carole Civil, MD;  Location: [facility];  Service: Orthopedics;  Laterality: Left;  . ROTATOR CUFF REPAIR Right     Current Outpatient Medications  Medication Sig Dispense Refill  . aspirin EC 81 MG tablet Take 81 mg by mouth daily.    Marland Kitchen atorvastatin (LIPITOR) 40 MG tablet Take 40 mg by mouth daily.     Marland Kitchen  bismuth subsalicylate (PEPTO BISMOL) 262 MG chewable tablet Chew 524 mg by mouth as needed for indigestion.    . calcium carbonate (TUMS - DOSED IN MG ELEMENTAL CALCIUM) 500 MG chewable tablet Chew 1 tablet by mouth as needed.     . fluticasone (FLONASE) 50 MCG/ACT nasal spray Place 1 spray into both nostrils daily.   3  . ibuprofen (ADVIL,MOTRIN) 200 MG tablet Take 200 mg by mouth every 8  (eight) hours as needed (for pain).     Marland Kitchen lansoprazole (PREVACID) 30 MG capsule Take 30 mg by mouth every other day. 30 mins before meal    . loratadine (CLARITIN) 10 MG tablet Take 10 mg by mouth daily.    Marland Kitchen lubiprostone (AMITIZA) 24 MCG capsule Take 24 mcg by mouth daily.    . Naphazoline HCl (CLEAR EYES OP) Place 1 drop 2 (two) times daily as needed into both eyes (dry eyes).     . psyllium (METAMUCIL) 58.6 % powder Take 1 packet by mouth daily.    . sucralfate (CARAFATE) 1 g tablet Take 1 tablet (1 g total) by mouth 4 (four) times daily -  with meals and at bedtime. (Patient taking differently: Take 1 g by mouth 4 (four) times daily -  with meals and at bedtime. As needed) 21 tablet 0  . valsartan-hydrochlorothiazide (DIOVAN-HCT) 160-25 MG tablet Take 1 tablet by mouth daily.    . hydrocortisone (PROCTOZONE-HC) 2.5 % rectal cream Place 1 application rectally 2 (two) times daily. 30 g 3   No current facility-administered medications for this visit.    Allergies as of 07/09/2019 - Review Complete 07/09/2019  Allergen Reaction Noted  . Codeine Other (See Comments)   . Meperidine hcl Other (See Comments)     Family History  Problem Relation Age of Onset  . Hypertension Mother   . Aneurysm Mother   . Diabetes Mother   . Heart disease Father   . Heart disease Son   . Colon cancer Brother        age 82, deceased 2 weeks later    Social History   Socioeconomic History  . Marital status: Divorced    Spouse name: Not on file  . Number of children: Not on file  . Years of education: Not on file  . Highest education level: Not on file  Occupational History  . Not on file  Tobacco Use  . Smoking status: Never Smoker  . Smokeless tobacco: Never Used  . Tobacco comment: Never smoked  Substance and Sexual Activity  . Alcohol use: No  . Drug use: No  . Sexual activity: Yes    Birth control/protection: Surgical    Comment: hyst  Other Topics Concern  . Not on file  Social  History Narrative  . Not on file   Social Determinants of Health   Financial Resource Strain:   . Difficulty of Paying Living Expenses: Not on file  Food Insecurity:   . Worried About Programme researcher, broadcasting/film/video in the Last Year: Not on file  . Ran Out of Food in the Last Year: Not on file  Transportation Needs:   . Lack of Transportation (Medical): Not on file  . Lack of Transportation (Non-Medical): Not on file  Physical Activity:   . Days of Exercise per Week: Not on file  . Minutes of Exercise per Session: Not on file  Stress:   . Feeling of Stress : Not on file  Social Connections:   . Frequency of  Communication with Friends and Family: Not on file  . Frequency of Social Gatherings with Friends and Family: Not on file  . Attends Religious Services: Not on file  . Active Member of Clubs or Organizations: Not on file  . Attends Banker Meetings: Not on file  . Marital Status: Not on file    Review of Systems: Gen: Denies fever, chills, anorexia. Denies fatigue, weakness, weight loss.  CV: Denies chest pain, palpitations, syncope, peripheral edema, and claudication. Resp: Denies dyspnea at rest, cough, wheezing, coughing up blood, and pleurisy. GI: see HPI Derm: Denies rash, itching, dry skin Psych: Denies depression, anxiety, memory loss, confusion. No homicidal or suicidal ideation.  Heme: Denies bruising, bleeding, and enlarged lymph nodes.  Physical Exam: BP 139/80   Pulse 96   Temp (!) 97.2 F (36.2 C) (Oral)   Ht 5\' 5"  (1.651 m)   Wt 185 lb (83.9 kg)   BMI 30.79 kg/m  General:   Alert and oriented. No distress noted. Pleasant and cooperative.  Head:  Normocephalic and atraumatic. Eyes:  Conjuctiva clear without scleral icterus. Abdomen:  +BS, soft, non-tender and non-distended. No rebound or guarding. No HSM or masses noted. Msk:  Symmetrical without gross deformities. Normal posture. Extremities:  Without edema. Neurologic:  Alert and  oriented  x4 Psych:  Alert and cooperative. Normal mood and affect.  ASSESSMENT: Tammy Kramer is a 69 y.o. female presenting today with history of constipation, hemorrhoids now resolved s/p banding, and GERD.   Constipation: managed well with Amitiza 24 mcg once daily and supplemental fiber.  GERD: Prevacid daily without alarm signs/symptoms.    PLAN:  Continue Amitiza 24 mcg once daily  Continue Metamucil daily  Continue PPI daily  Return in 6-8 months  Next colonoscopy in 2023  2024, PhD, ANP-BC [facility]

## 2019-07-09 ENCOUNTER — Other Ambulatory Visit: Payer: Self-pay

## 2019-07-09 ENCOUNTER — Encounter: Payer: Self-pay | Admitting: Gastroenterology

## 2019-07-09 ENCOUNTER — Ambulatory Visit: Payer: Medicare Other | Admitting: Gastroenterology

## 2019-07-09 VITALS — BP 139/80 | HR 96 | Temp 97.2°F | Ht 65.0 in | Wt 185.0 lb

## 2019-07-09 DIAGNOSIS — K219 Gastro-esophageal reflux disease without esophagitis: Secondary | ICD-10-CM

## 2019-07-09 DIAGNOSIS — K5900 Constipation, unspecified: Secondary | ICD-10-CM | POA: Diagnosis not present

## 2019-07-09 MED ORDER — HYDROCORTISONE (PERIANAL) 2.5 % EX CREA: 1.0000 "application " | TOPICAL_CREAM | Freq: Two times a day (BID) | CUTANEOUS | 3 refills | Status: AC

## 2019-07-09 NOTE — Patient Instructions (Signed)
Continue Amitiza and Metamucil as you are doing.  I refilled the cream for you. Try to use this sparingly.   We will see you in 6-8 months!  I enjoyed seeing you again today! As you know, I value our relationship and want to provide genuine, compassionate, and quality care. I welcome your feedback. If you receive a survey regarding your visit,  I greatly appreciate you taking time to fill this out. See you next time!  Gelene Mink, PhD, ANP-BC [facility]

## 2019-07-24 ENCOUNTER — Ambulatory Visit (HOSPITAL_COMMUNITY): Payer: Medicare Other

## 2019-07-29 DIAGNOSIS — K219 Gastro-esophageal reflux disease without esophagitis: Secondary | ICD-10-CM | POA: Diagnosis not present

## 2019-07-29 DIAGNOSIS — I10 Essential (primary) hypertension: Secondary | ICD-10-CM | POA: Diagnosis not present

## 2019-08-05 ENCOUNTER — Inpatient Hospital Stay (HOSPITAL_COMMUNITY): Admission: RE | Admit: 2019-08-05 | Payer: Medicare Other | Source: Ambulatory Visit

## 2019-08-26 DIAGNOSIS — K219 Gastro-esophageal reflux disease without esophagitis: Secondary | ICD-10-CM | POA: Diagnosis not present

## 2019-08-26 DIAGNOSIS — I10 Essential (primary) hypertension: Secondary | ICD-10-CM | POA: Diagnosis not present

## 2019-09-18 IMAGING — MG DIGITAL SCREENING BILATERAL MAMMOGRAM WITH TOMO AND CAD
6 of 10 series · 6 of 30 positions shown · non-contrast
Comparison: Previous exam(s).

CLINICAL DATA: Screening.

EXAM:
DIGITAL SCREENING BILATERAL MAMMOGRAM WITH TOMO AND CAD

[R CC synth-2D]
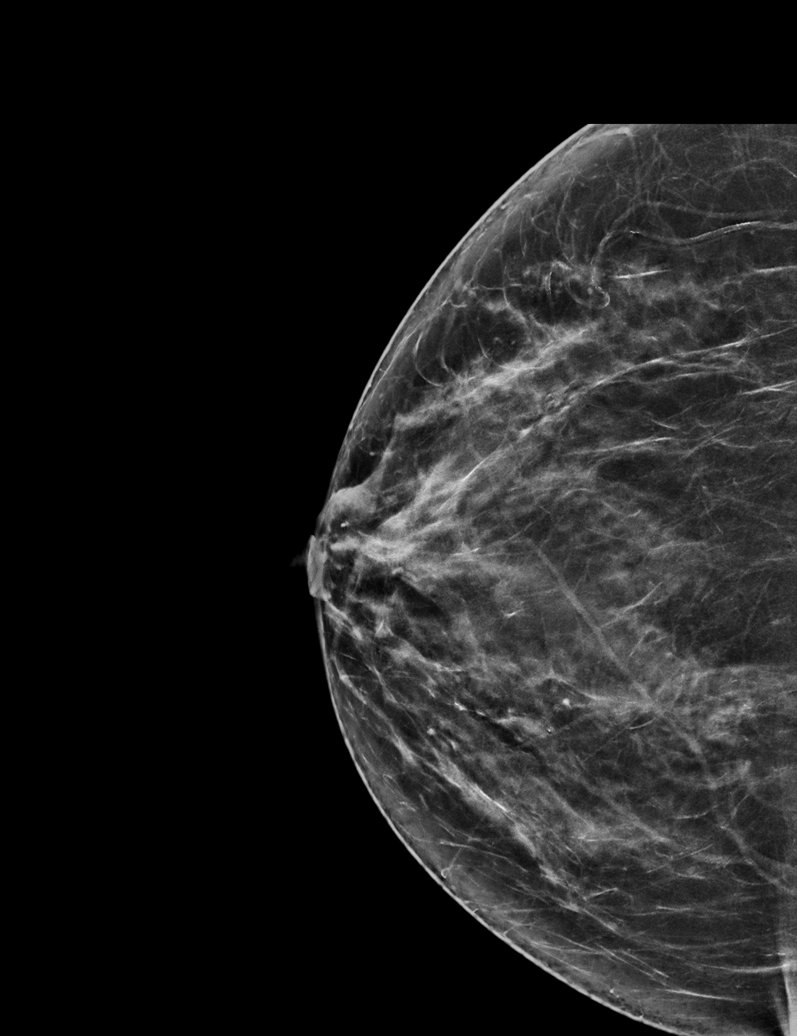

[L CC synth-2D (1 of 2)]
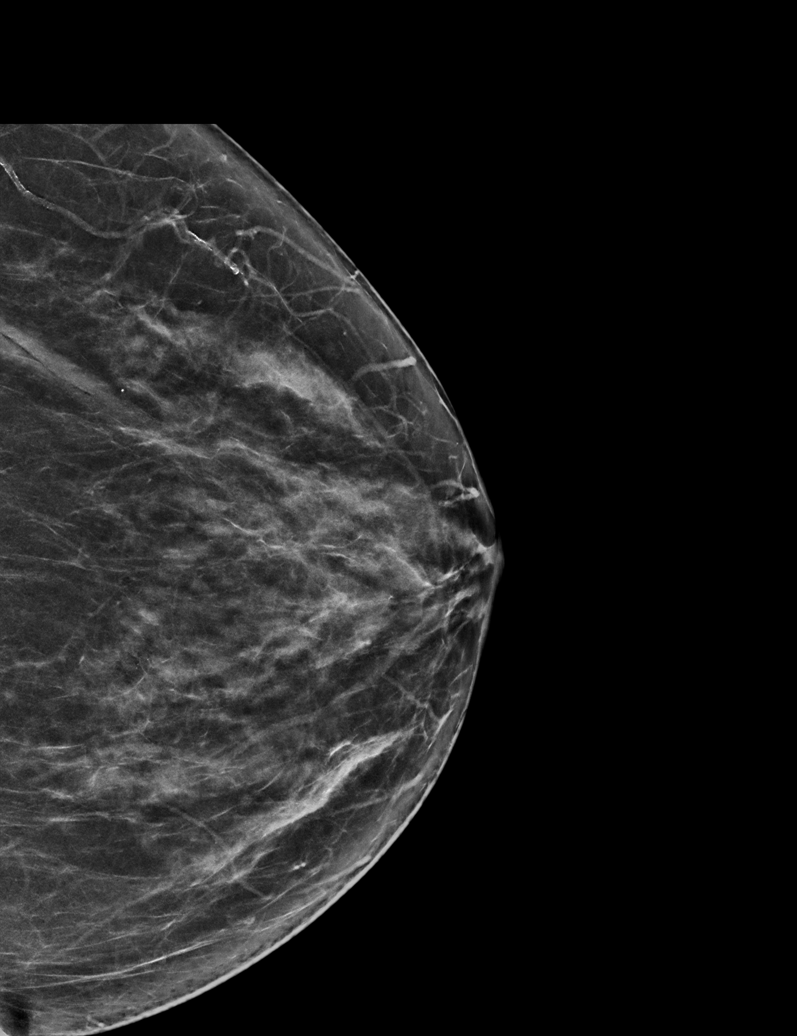

[L MLO synth-2D]
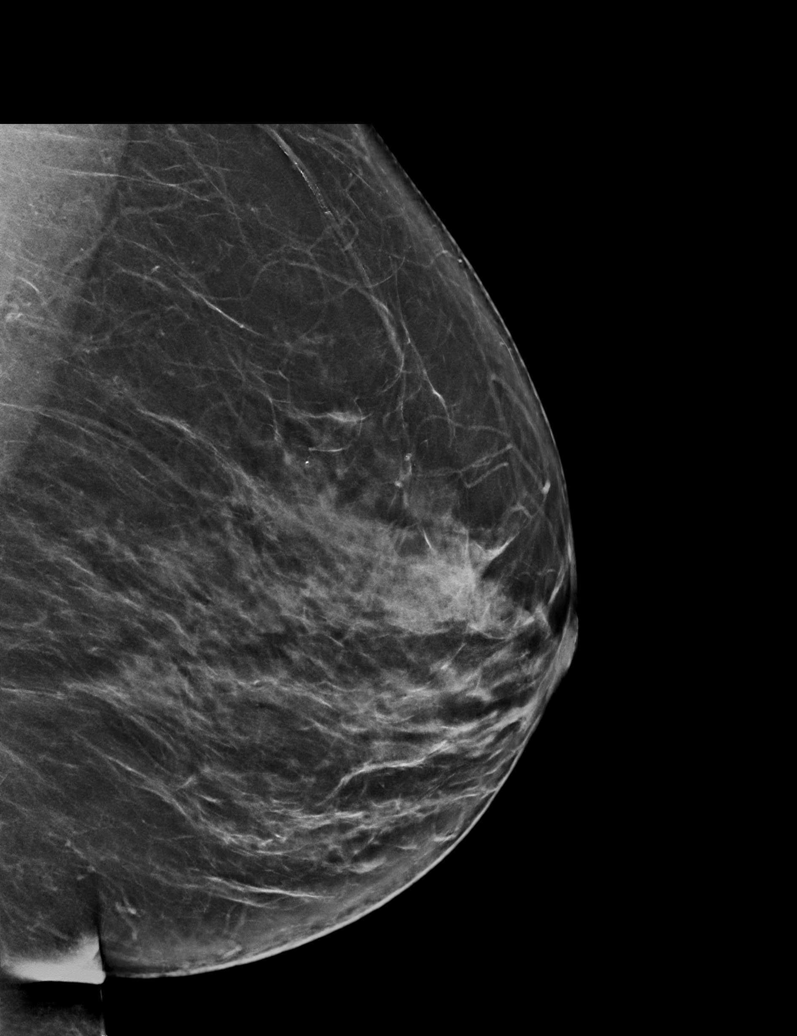

[L CC synth-2D (2 of 2)]
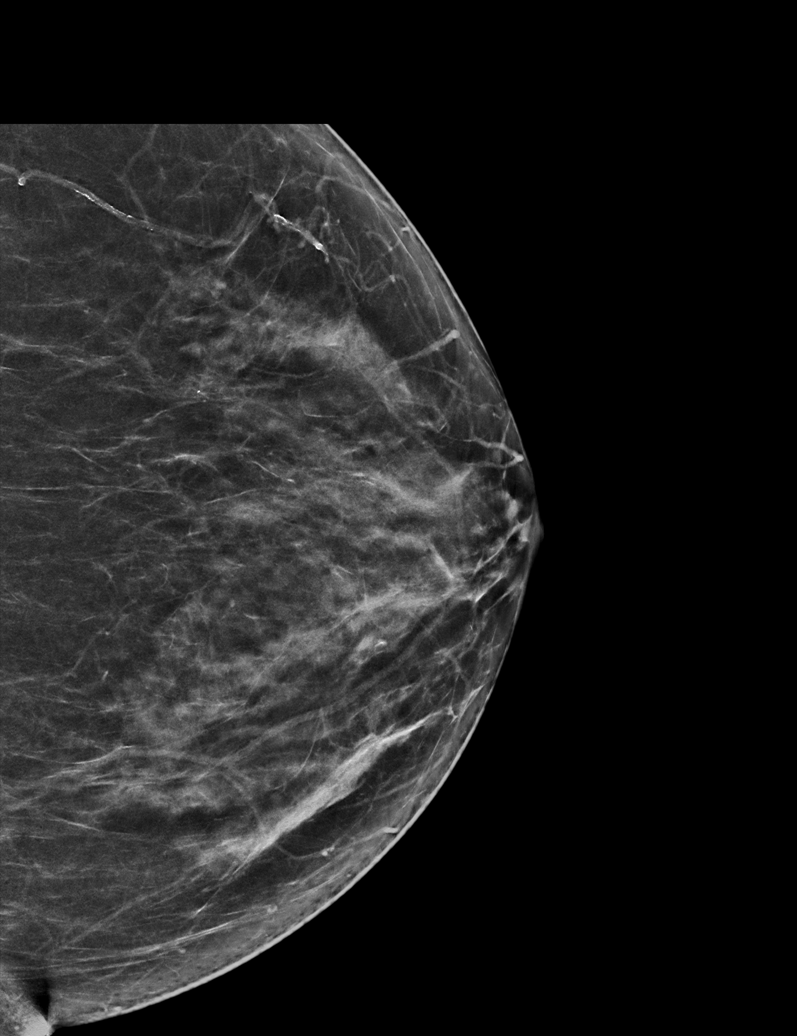

[R MLO synth-2D]
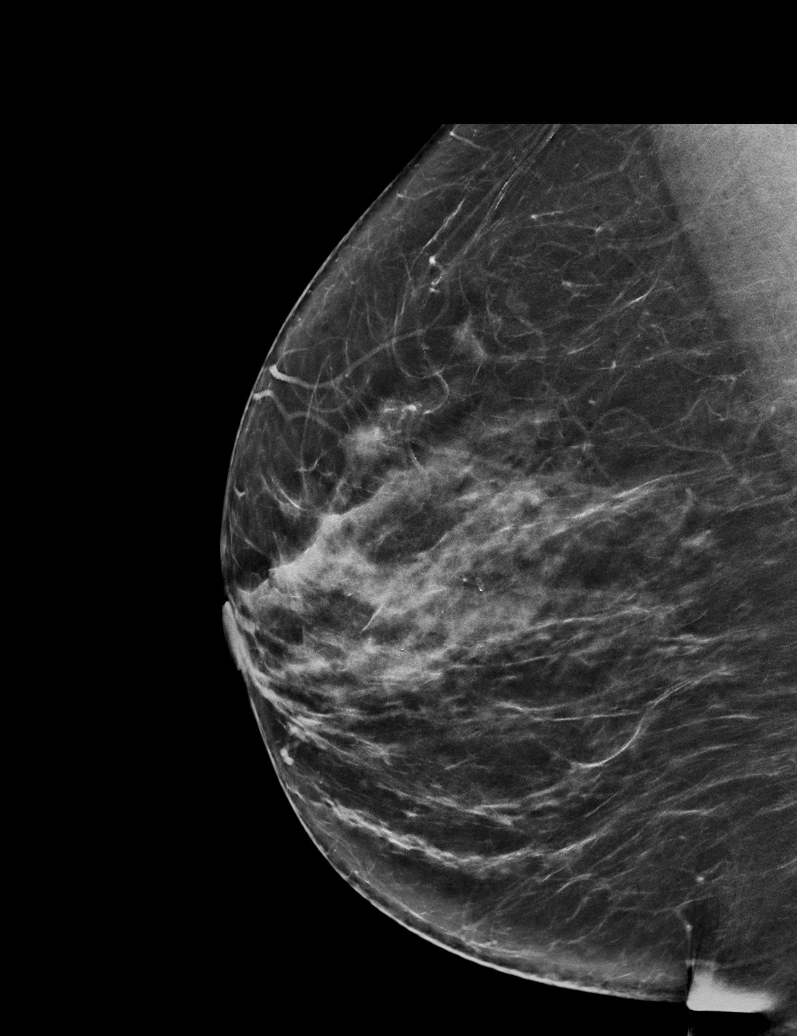

[L CC tomo · tomo slice 33/66.0]
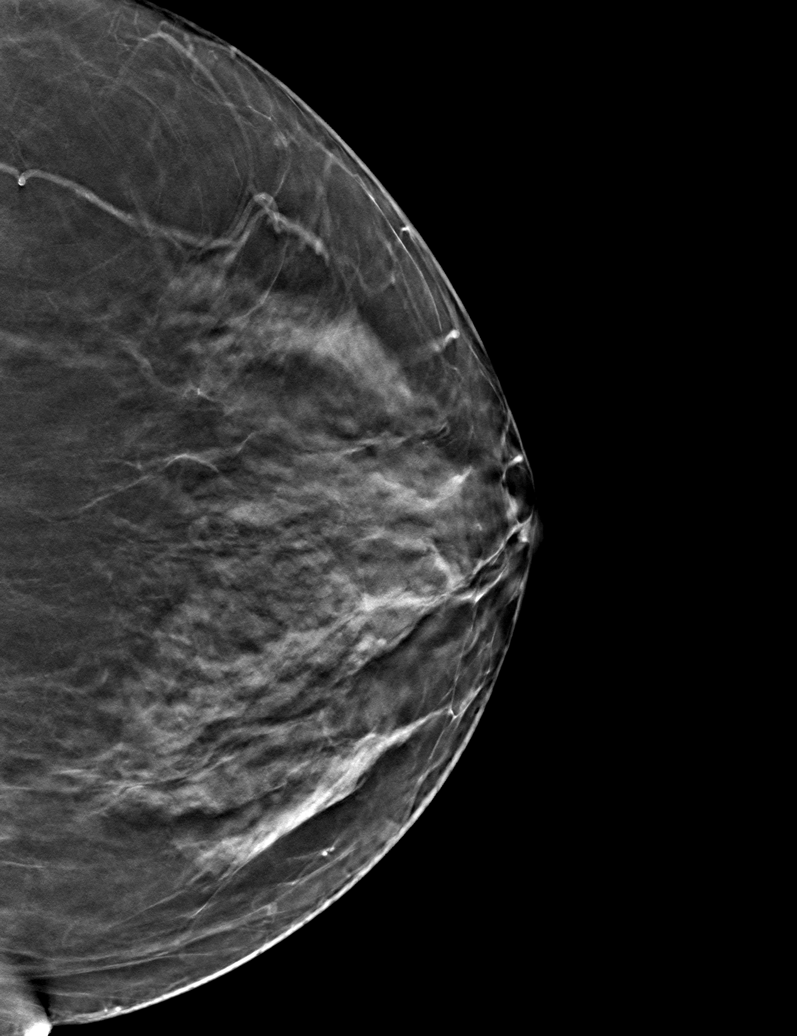

[6 of 30 positions shown; findings below may reference images not displayed]

ACR Breast Density Category c: The breast tissue is heterogeneously
dense, which may obscure small masses.
FINDINGS: There are no findings suspicious for malignancy. Images were
processed with CAD.
IMPRESSION: No mammographic evidence of malignancy. A result letter of this
screening mammogram will be mailed directly to the patient.

RECOMMENDATION:
Screening mammogram in one year. (Code:[id number]-U-LHB)

BI-RADS CATEGORY  1: Negative.

## 2019-09-26 DIAGNOSIS — K219 Gastro-esophageal reflux disease without esophagitis: Secondary | ICD-10-CM | POA: Diagnosis not present

## 2019-09-26 DIAGNOSIS — I10 Essential (primary) hypertension: Secondary | ICD-10-CM | POA: Diagnosis not present

## 2019-10-26 DIAGNOSIS — K219 Gastro-esophageal reflux disease without esophagitis: Secondary | ICD-10-CM | POA: Diagnosis not present

## 2019-10-26 DIAGNOSIS — M159 Polyosteoarthritis, unspecified: Secondary | ICD-10-CM | POA: Diagnosis not present

## 2019-11-26 DIAGNOSIS — E785 Hyperlipidemia, unspecified: Secondary | ICD-10-CM | POA: Diagnosis not present

## 2019-11-26 DIAGNOSIS — K219 Gastro-esophageal reflux disease without esophagitis: Secondary | ICD-10-CM | POA: Diagnosis not present

## 2020-01-07 DIAGNOSIS — M159 Polyosteoarthritis, unspecified: Secondary | ICD-10-CM | POA: Diagnosis not present

## 2020-01-07 DIAGNOSIS — E785 Hyperlipidemia, unspecified: Secondary | ICD-10-CM | POA: Diagnosis not present

## 2020-01-07 DIAGNOSIS — I10 Essential (primary) hypertension: Secondary | ICD-10-CM | POA: Diagnosis not present

## 2020-01-07 DIAGNOSIS — K219 Gastro-esophageal reflux disease without esophagitis: Secondary | ICD-10-CM | POA: Diagnosis not present

## 2020-01-16 ENCOUNTER — Other Ambulatory Visit: Payer: Self-pay

## 2020-01-16 ENCOUNTER — Ambulatory Visit: Payer: Medicare Other | Admitting: Gastroenterology

## 2020-01-16 ENCOUNTER — Encounter: Payer: Self-pay | Admitting: Gastroenterology

## 2020-01-16 ENCOUNTER — Encounter: Payer: Self-pay | Admitting: Orthopedic Surgery

## 2020-01-16 ENCOUNTER — Ambulatory Visit: Payer: Medicare Other

## 2020-01-16 ENCOUNTER — Ambulatory Visit: Payer: Medicare Other | Admitting: Orthopedic Surgery

## 2020-01-16 VITALS — BP 128/75 | HR 86 | Temp 96.9°F | Ht 65.0 in | Wt 185.0 lb

## 2020-01-16 VITALS — BP 147/81 | HR 90 | Ht 65.0 in | Wt 180.0 lb

## 2020-01-16 DIAGNOSIS — K5900 Constipation, unspecified: Secondary | ICD-10-CM | POA: Diagnosis not present

## 2020-01-16 DIAGNOSIS — M25561 Pain in right knee: Secondary | ICD-10-CM

## 2020-01-16 DIAGNOSIS — K219 Gastro-esophageal reflux disease without esophagitis: Secondary | ICD-10-CM | POA: Diagnosis not present

## 2020-01-16 MED ORDER — DICLOFENAC SODIUM 75 MG PO TBEC: 75.0000 mg | DELAYED_RELEASE_TABLET | Freq: Every day | ORAL | 2 refills | Status: AC

## 2020-01-16 NOTE — Progress Notes (Signed)
Chief Complaint  Patient presents with  . Knee Pain    right     (she initially wrote right leg pain) ??   89 female with right leg, knee pain for 4 weeks no h/o trauma  Pain level 7-8  Location anteromedial  Describe (sharp, dull etc) dull aching radiating down the tibial shin  May have been brought on by mowing the lawn  Patient describes increased stiffness and increased pain at night  Seems to be getting better after she took Tylenol arthritis and use topical Voltaren gel  Review of Systems  Constitutional: Negative for fever.  Skin: Negative.   Neurological: Negative for sensory change.    has a past medical history of Arthritis, Back pain, Chronic constipation, Complication of anesthesia, GERD (gastroesophageal reflux disease), Hemorrhoids, Hypercholesterolemia, Hypertension, and Seasonal allergies.    has a past surgical history that includes Colonoscopy ( 10/16/2006); Esophagogastroduodenoscopy (06/14/2006); cholecystectomy; Abdominal hysterectomy; Back surgery; C4-C5 fusion; foot surgery; Hand surgery (Right); Rotator cuff repair (Right); Colonoscopy (Feb 2011); Esophagogastroduodenoscopy (Feb 2011); Colonoscopy (N/A, 04/08/2014); Hemorrhoid banding (2016); Cholecystectomy; Knee arthroscopy with lateral menisectomy (Left, 03/27/2015); Dilation and curettage of uterus; Colonoscopy (N/A, 12/08/2016); Esophagogastroduodenoscopy (N/A, 12/08/2016); Cataract extraction w/PHACO (Right, 05/02/2017); and Cataract extraction w/PHACO (Left, 05/16/2017).   BP (!) 147/81   Pulse 90   Ht 5\' 5"  (1.651 m)   Wt 180 lb (81.6 kg)   BMI 29.95 kg/m   Physical Exam Constitutional:      General: She is not in acute distress.    Appearance: She is well-developed.  Cardiovascular:     Comments: No peripheral edema Skin:    General: Skin is warm and dry.  Neurological:     Mental Status: She is alert and oriented to person, place, and time.     Sensory: No sensory deficit.      Coordination: Coordination normal.     Gait: Gait normal.     Deep Tendon Reflexes: Reflexes are normal and symmetric.    The left knee examined for comparison seems to be fine with no tenderness normal range of motion good ligamentous stability normal muscle tone  The right knee shows some medial joint line tenderness medial pes bursa tenderness no effusion that I could detect, her flexion arc was 125 degrees seem to come to full extension without pain  The ligaments were tested and were stable and she had good strength in the quadriceps  Images of the right knee show the need to have some mild degenerative changes left knee also has some mild degenerative changes  Both knees may have effusions  Recommend continue with Tylenol arthritis, topical Voltaren gel and oral diclofenac 75 mg daily  Meds ordered this encounter  Medications  . diclofenac (VOLTAREN) 75 MG EC tablet    Sig: Take 1 tablet (75 mg total) by mouth at bedtime.    Dispense:  30 tablet    Refill:  2   Acute pain acute uncomplicated, medication management, office x-ray

## 2020-01-16 NOTE — Progress Notes (Signed)
Referring Provider: Avon Gully, MD Primary Care Physician:  Avon Gully, MD Primary GI: Dr. Jena Gauss    Chief Complaint  Patient presents with  . Gastroesophageal Reflux  . Rectal Bleeding    very small amount of blood couple times    HPI:   Tammy Kramer is a 69 y.o. female presenting today with a history of GERD, constipation, Grade 3 hemorrhoids s/p banding last year. Colonoscopy due in 2023.   Cut out eating foods that would bother her and bind her up. Less bread and pizza. BMs are better. Walking in afternoons. Feels like her body is smaller but weight is stable from Jan 2021. Was up to 191 at home. Today 185. More frequent BMs, no diarrhea. Had small amount of blood twice on paper. No abdominal pain. No rectal pain. Stopped mowing as she felt more pressure here. Metamucil has helped a lot with consistency. Takes Amitiza every other day and sometimes just prn. Feels like constipation overall is getting much better. Takes Prevacid daily.   Past Medical History:  Diagnosis Date  . Arthritis   . Back pain   . Chronic constipation   . Complication of anesthesia    difficulty waking up from anesthesia  . GERD (gastroesophageal reflux disease)   . Hemorrhoids   . Hypercholesterolemia   . Hypertension   . Seasonal allergies     Past Surgical History:  Procedure Laterality Date  . ABDOMINAL HYSTERECTOMY    . BACK SURGERY    . C4-C5 fusion    . CATARACT EXTRACTION W/PHACO Right 05/02/2017   Procedure: CATARACT EXTRACTION PHACO AND INTRAOCULAR LENS PLACEMENT (IOC);  Surgeon: Jethro Bolus, MD;  Location: [facility];  Service: Ophthalmology;  Laterality: Right;  CDE: 5.99  . CATARACT EXTRACTION W/PHACO Left 05/16/2017   Procedure: CATARACT EXTRACTION PHACO AND INTRAOCULAR LENS PLACEMENT (IOC);  Surgeon: Jethro Bolus, MD;  Location: [facility];  Service: Ophthalmology;  Laterality: Left;  CDE: 3.80  . cholecystectomy    . CHOLECYSTECTOMY    . COLONOSCOPY   10/16/2006   NUR: 3  mm polyp ablated via cold biopsy from the splenic flexure.External hemorrhoids, hyperplastic polyp  . COLONOSCOPY  Feb 2011   Dr. Jena Gauss: internal hemorrhoids, otherwise normal.   . COLONOSCOPY N/A 04/08/2014   RMR: Internal hemorrhoids; colonic diverticulosis( few  as described above) hematochezia likely secondary to hemorrhoids in the setting of constipation.  . COLONOSCOPY N/A 12/08/2016   Dr. Jena Gauss: none bleeding internal hemorrhoids which were small. Next colonoscopy in five years.  Marland Kitchen DILATION AND CURETTAGE OF UTERUS     multiple  . ESOPHAGOGASTRODUODENOSCOPY  06/14/2006   NUR: Normal esophagogastroduodenoscopy  . ESOPHAGOGASTRODUODENOSCOPY  Feb 2011   Dr. Jena Gauss: inflamed-appearing esophagus with overlying distal esophageal erosions superimposed on noncritical Schatzki ring, s/p dilation and biopsy. Benign path  . ESOPHAGOGASTRODUODENOSCOPY N/A 12/08/2016   Dr. Jena Gauss: LA grade A esophagitis, mild schatzki's ring at the GE junction, small hiatal hernia, esophageal dilation performed. Prevacid increased to BID.  . foot surgery     X 5-bone spurs and tendon release; morton's neuroma.  Marland Kitchen HAND SURGERY Right    X 2-carpal tunnel release and ganglion cyst removed.  Marland Kitchen HEMORRHOID BANDING  2016   Dr.Rourk  . KNEE ARTHROSCOPY WITH LATERAL MENISECTOMY Left 03/27/2015   Procedure: KNEE ARTHROSCOPY WITH LATERAL MENISECTOMY;  Surgeon: Vickki Hearing, MD;  Location: [facility];  Service: Orthopedics;  Laterality: Left;  . ROTATOR CUFF REPAIR Right     Current Outpatient Medications  Medication Sig Dispense Refill  . aspirin EC 81 MG tablet Take 81 mg by mouth daily.    Marland Kitchen atorvastatin (LIPITOR) 40 MG tablet Take 40 mg by mouth daily.     Marland Kitchen bismuth subsalicylate (PEPTO BISMOL) 262 MG chewable tablet Chew 524 mg by mouth as needed for indigestion.    . calcium carbonate (TUMS - DOSED IN MG ELEMENTAL CALCIUM) 500 MG chewable tablet Chew 1 tablet by mouth as needed.     . fluticasone (FLONASE) 50 MCG/ACT  nasal spray Place 1 spray into both nostrils daily.   3  . hydrocortisone (PROCTOZONE-HC) 2.5 % rectal cream Place 1 application rectally 2 (two) times daily. (Patient taking differently: Place 1 application rectally as needed. ) 30 g 3  . ibuprofen (ADVIL,MOTRIN) 200 MG tablet Take 200 mg by mouth every 8 (eight) hours as needed (for pain).     Marland Kitchen lansoprazole (PREVACID) 30 MG capsule Take 30 mg by mouth every other day. 30 mins before meal    . loratadine (CLARITIN) 10 MG tablet Take 10 mg by mouth daily.    Marland Kitchen lubiprostone (AMITIZA) 24 MCG capsule Take 24 mcg by mouth every other day.     . Naphazoline HCl (CLEAR EYES OP) Place 1 drop 2 (two) times daily as needed into both eyes (dry eyes).     . psyllium (METAMUCIL) 58.6 % powder Take 1 packet by mouth daily.    . sucralfate (CARAFATE) 1 g tablet Take 1 tablet (1 g total) by mouth 4 (four) times daily -  with meals and at bedtime. (Patient taking differently: Take 1 g by mouth 4 (four) times daily -  with meals and at bedtime. As needed) 21 tablet 0  . valsartan-hydrochlorothiazide (DIOVAN-HCT) 160-25 MG tablet Take 1 tablet by mouth daily.     No current facility-administered medications for this visit.    Allergies as of 01/16/2020 - Review Complete 01/16/2020  Allergen Reaction Noted  . Codeine Other (See Comments)   . Meperidine hcl Other (See Comments)     Family History  Problem Relation Age of Onset  . Hypertension Mother   . Aneurysm Mother   . Diabetes Mother   . Heart disease Father   . Heart disease Son   . Colon cancer Brother        age 78, deceased 2 weeks later    Social History   Socioeconomic History  . Marital status: Divorced    Spouse name: Not on file  . Number of children: Not on file  . Years of education: Not on file  . Highest education level: Not on file  Occupational History  . Not on file  Tobacco Use  . Smoking status: Never Smoker  . Smokeless tobacco: Never Used  . Tobacco comment: Never  smoked  Vaping Use  . Vaping Use: Never used  Substance and Sexual Activity  . Alcohol use: No  . Drug use: No  . Sexual activity: Yes    Birth control/protection: Surgical    Comment: hyst  Other Topics Concern  . Not on file  Social History Narrative  . Not on file   Social Determinants of Health   Financial Resource Strain:   . Difficulty of Paying Living Expenses:   Food Insecurity:   . Worried About Programme researcher, broadcasting/film/video in the Last Year:   . Barista in the Last Year:   Transportation Needs:   . Freight forwarder (Medical):   Marland Kitchen  Lack of Transportation (Non-Medical):   Physical Activity:   . Days of Exercise per Week:   . Minutes of Exercise per Session:   Stress:   . Feeling of Stress :   Social Connections:   . Frequency of Communication with Friends and Family:   . Frequency of Social Gatherings with Friends and Family:   . Attends Religious Services:   . Active Member of Clubs or Organizations:   . Attends Banker Meetings:   Marland Kitchen Marital Status:     Review of Systems: Gen: Denies fever, chills, anorexia. Denies fatigue, weakness, weight loss.  CV: Denies chest pain, palpitations, syncope, peripheral edema, and claudication. Resp: Denies dyspnea at rest, cough, wheezing, coughing up blood, and pleurisy. GI: see HPI Derm: Denies rash, itching, dry skin Psych: Denies depression, anxiety, memory loss, confusion. No homicidal or suicidal ideation.  Heme: see HPI  Physical Exam: BP 128/75   Pulse 86   Temp (!) 96.9 F (36.1 C) (Temporal)   Ht 5\' 5"  (1.651 m)   Wt 185 lb (83.9 kg)   BMI 30.79 kg/m  General:   Alert and oriented. No distress noted. Pleasant and cooperative.  Head:  Normocephalic and atraumatic. Eyes:  Conjuctiva clear without scleral icterus. Mouth:  Mask in place Abdomen:  +BS, soft, non-tender and non-distended. No rebound or guarding. No HSM or masses noted. Msk:  Symmetrical without gross deformities. Normal  posture. Extremities:  Without edema. Neurologic:  Alert and  oriented x4 Psych:  Alert and cooperative. Normal mood and affect.  ASSESSMENT: Tammy Kramer is a 68 y.o. female presenting today in follow-up for constipation and GERD.  She is doing quite well from constipation standpoint, taking Amitiza just as needed and continues with Metamucil daily. Scant amount of bleeding on 2 occasions noted; however, she does have a history of Grade 3 hemorrhoids with banding last year. Colonoscopy in 2018 with surveillance due 2023 due to FH of colon cancer. We will monitor clinically but if persistent low volume hematochezia, recommend early interval colonoscopy.  Prevacid continues to control GERD symptoms without concerning signs.   PLAN:   Continue supplemental fiber and Amitiza prn  Continue Prevacid  Return in 6 months  2024, PhD, [facility]

## 2020-01-16 NOTE — Patient Instructions (Signed)
Continue fiber and Amitiza as you are doing.   Continue Prevacid daily.  Please call if any further rectal bleeding.   We will see you in 6 months!   I enjoyed seeing you again today! As you know, I value our relationship and want to provide genuine, compassionate, and quality care. I welcome your feedback. If you receive a survey regarding your visit,  I greatly appreciate you taking time to fill this out. See you next time!  Gelene Mink, PhD, ANP-BC [facility]

## 2020-02-07 DIAGNOSIS — M159 Polyosteoarthritis, unspecified: Secondary | ICD-10-CM | POA: Diagnosis not present

## 2020-02-07 DIAGNOSIS — K219 Gastro-esophageal reflux disease without esophagitis: Secondary | ICD-10-CM | POA: Diagnosis not present

## 2020-02-13 ENCOUNTER — Other Ambulatory Visit (HOSPITAL_COMMUNITY): Payer: Self-pay | Admitting: Internal Medicine

## 2020-02-13 DIAGNOSIS — Z1231 Encounter for screening mammogram for malignant neoplasm of breast: Secondary | ICD-10-CM

## 2020-02-18 IMAGING — DX DG CHEST 2V
2 series · 2 of 2 positions shown · non-contrast
Comparison: 07/20/2006.

CLINICAL DATA: Left chest pain and pressure. Shortness of breath.

EXAM:
CHEST - 2 VIEW

[chest pa]
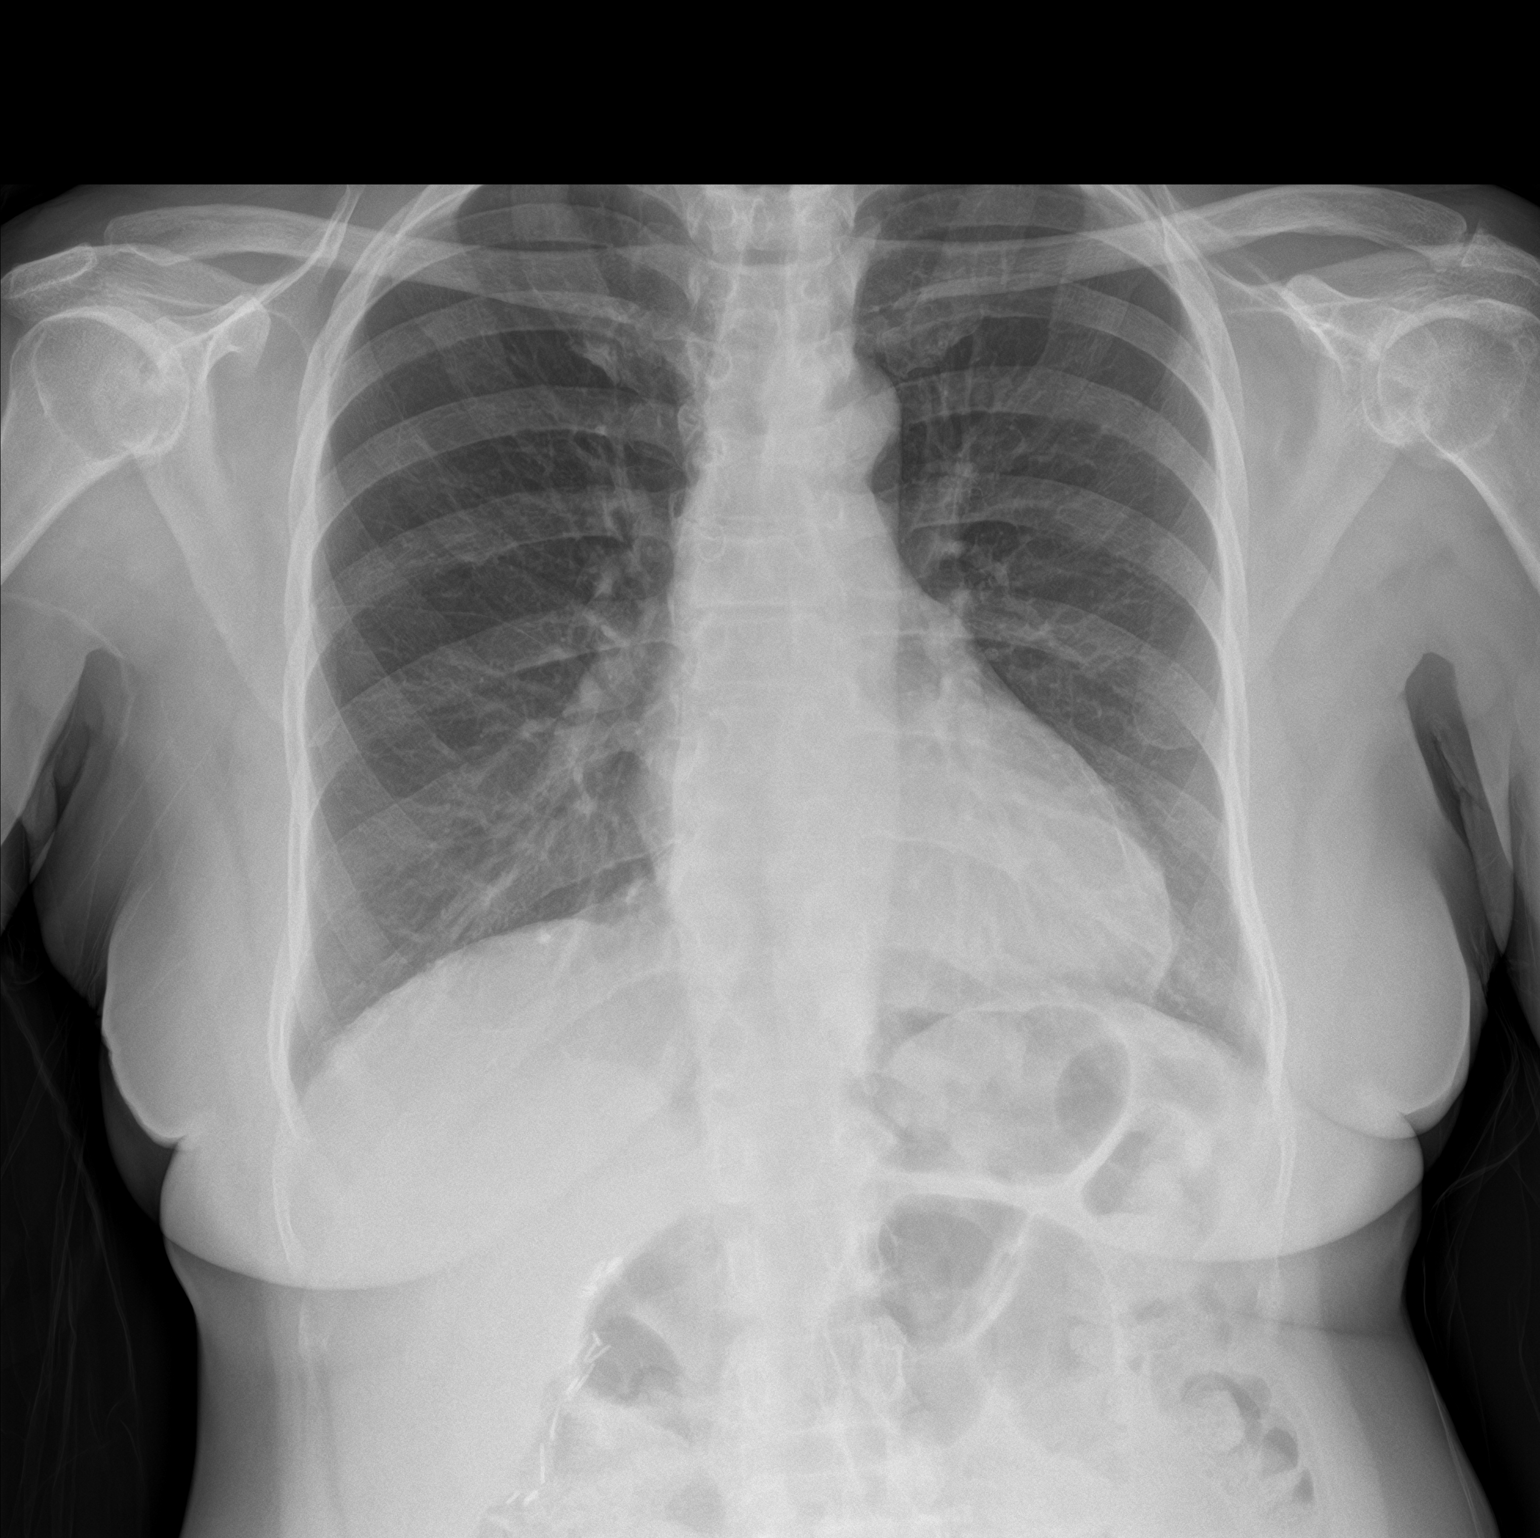

[chest lat]
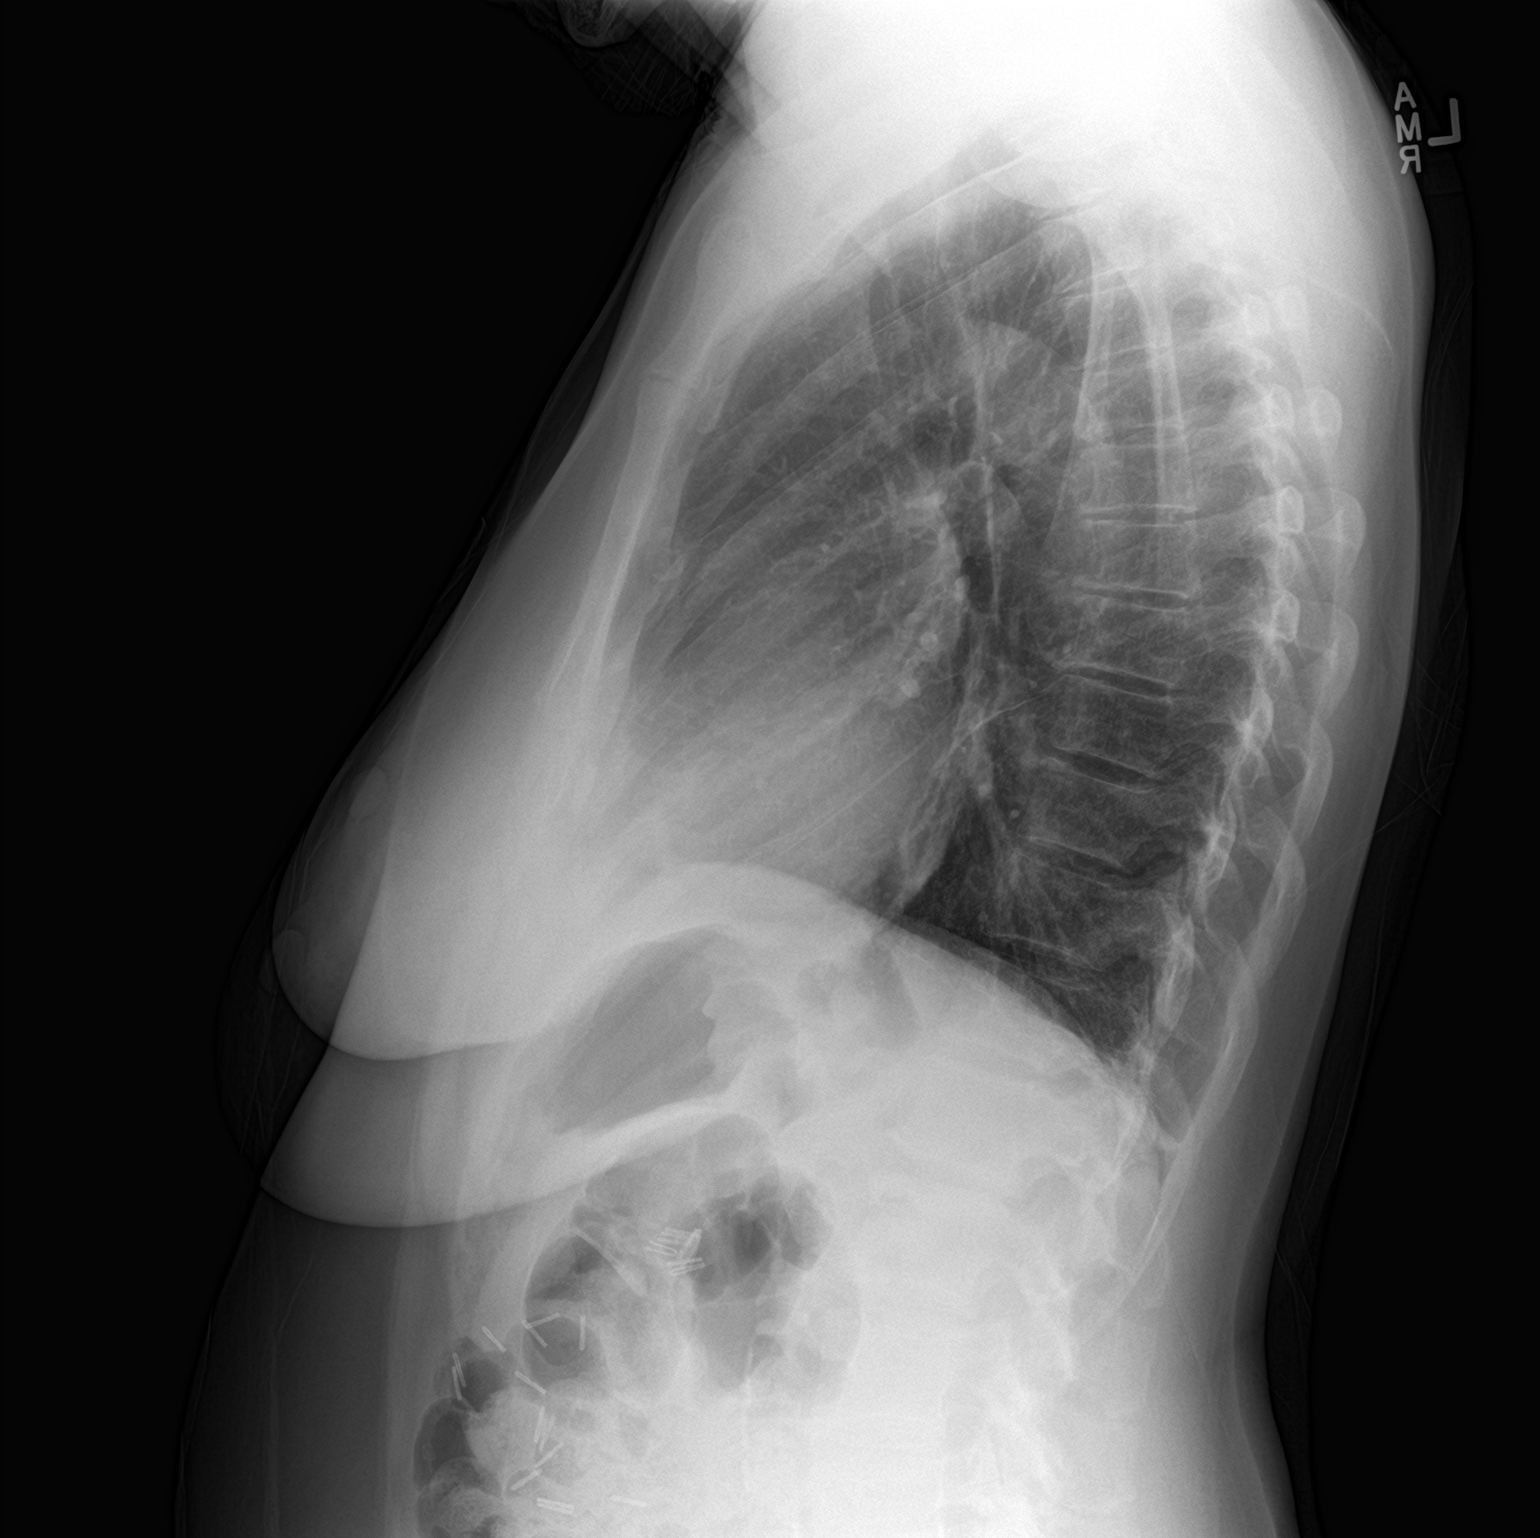

[2 of 2 positions shown; findings below may reference images not displayed]

FINDINGS: The cardiac silhouette remains borderline enlarged. Clear lungs with
normal vascularity. Mild thoracic spine degenerative changes.
Cholecystectomy clips.
IMPRESSION: No acute findings.

## 2020-02-20 ENCOUNTER — Ambulatory Visit (HOSPITAL_COMMUNITY)
Admission: RE | Admit: 2020-02-20 | Discharge: 2020-02-20 | Disposition: A | Discharge status: Home / Self Care | Payer: Medicare Other | Source: Ambulatory Visit | Attending: Internal Medicine | Admitting: Internal Medicine

## 2020-02-20 ENCOUNTER — Other Ambulatory Visit: Payer: Self-pay

## 2020-02-20 DIAGNOSIS — Z1231 Encounter for screening mammogram for malignant neoplasm of breast: Secondary | ICD-10-CM

## 2020-03-09 DIAGNOSIS — K219 Gastro-esophageal reflux disease without esophagitis: Secondary | ICD-10-CM | POA: Diagnosis not present

## 2020-03-09 DIAGNOSIS — I10 Essential (primary) hypertension: Secondary | ICD-10-CM | POA: Diagnosis not present

## 2020-03-16 DIAGNOSIS — Z23 Encounter for immunization: Secondary | ICD-10-CM | POA: Diagnosis not present

## 2020-04-08 DIAGNOSIS — K219 Gastro-esophageal reflux disease without esophagitis: Secondary | ICD-10-CM | POA: Diagnosis not present

## 2020-04-08 DIAGNOSIS — I10 Essential (primary) hypertension: Secondary | ICD-10-CM | POA: Diagnosis not present

## 2020-05-09 DIAGNOSIS — M159 Polyosteoarthritis, unspecified: Secondary | ICD-10-CM | POA: Diagnosis not present

## 2020-05-09 DIAGNOSIS — K219 Gastro-esophageal reflux disease without esophagitis: Secondary | ICD-10-CM | POA: Diagnosis not present

## 2020-06-08 DIAGNOSIS — K219 Gastro-esophageal reflux disease without esophagitis: Secondary | ICD-10-CM | POA: Diagnosis not present

## 2020-06-08 DIAGNOSIS — I10 Essential (primary) hypertension: Secondary | ICD-10-CM | POA: Diagnosis not present

## 2020-07-02 DIAGNOSIS — Z1389 Encounter for screening for other disorder: Secondary | ICD-10-CM | POA: Diagnosis not present

## 2020-07-02 DIAGNOSIS — E785 Hyperlipidemia, unspecified: Secondary | ICD-10-CM | POA: Diagnosis not present

## 2020-07-02 DIAGNOSIS — K219 Gastro-esophageal reflux disease without esophagitis: Secondary | ICD-10-CM | POA: Diagnosis not present

## 2020-07-02 DIAGNOSIS — Z79899 Other long term (current) drug therapy: Secondary | ICD-10-CM | POA: Diagnosis not present

## 2020-07-02 DIAGNOSIS — I10 Essential (primary) hypertension: Secondary | ICD-10-CM | POA: Diagnosis not present

## 2020-07-02 DIAGNOSIS — Z0001 Encounter for general adult medical examination with abnormal findings: Secondary | ICD-10-CM | POA: Diagnosis not present

## 2020-07-20 ENCOUNTER — Telehealth: Payer: Self-pay | Admitting: *Deleted

## 2020-07-20 NOTE — Telephone Encounter (Addendum)
Pt consented to a telephone visit on 07/21/2020.

## 2020-07-20 NOTE — Telephone Encounter (Signed)
Tammy Kramer, you are scheduled for a virtual visit with your provider today.  Just as we do with appointments in the office, we must obtain your consent to participate.  Your consent will be active for this visit and any virtual visit you may have with one of our providers in the next 365 days.  If you have a MyChart account, I can also send a copy of this consent to you electronically.  All virtual visits are billed to your insurance company just like a traditional visit in the office.  As this is a virtual visit, video technology does not allow for your provider to perform a traditional examination.  This may limit your provider's ability to fully assess your condition.  If your provider identifies any concerns that need to be evaluated in person or the need to arrange testing such as labs, EKG, etc, we will make arrangements to do so.  Although advances in technology are sophisticated, we cannot ensure that it will always work on either your end or our end.  If the connection with a video visit is poor, we may have to switch to a telephone visit.  With either a video or telephone visit, we are not always able to ensure that we have a secure connection.   I need to obtain your verbal consent now.   Are you willing to proceed with your visit today?

## 2020-07-21 ENCOUNTER — Encounter: Payer: Self-pay | Admitting: Internal Medicine

## 2020-07-21 ENCOUNTER — Ambulatory Visit (INDEPENDENT_AMBULATORY_CARE_PROVIDER_SITE_OTHER): Payer: Medicare Other | Admitting: Gastroenterology

## 2020-07-21 ENCOUNTER — Other Ambulatory Visit: Payer: Self-pay

## 2020-07-21 ENCOUNTER — Encounter: Payer: Self-pay | Admitting: Gastroenterology

## 2020-07-21 DIAGNOSIS — K219 Gastro-esophageal reflux disease without esophagitis: Secondary | ICD-10-CM | POA: Diagnosis not present

## 2020-07-21 DIAGNOSIS — K5900 Constipation, unspecified: Secondary | ICD-10-CM | POA: Diagnosis not present

## 2020-07-21 NOTE — Patient Instructions (Signed)
I recommend adding Benefiber or Metamucil daily back to your regimen.   Please let me know how this works for you!  We will see you in 6 months!   I enjoyed talking with you again today! As you know, I value our relationship and want to provide genuine, compassionate, and quality care. I welcome your feedback. If you receive a survey regarding your visit,  I greatly appreciate you taking time to fill this out. See you next time!  Gelene Mink, PhD, ANP-BC [facility]

## 2020-07-21 NOTE — Progress Notes (Signed)
Primary Care Physician:  Avon Gully, MD  Primary GI: Dr. Jena Gauss   Patient Location: Home   Provider Location: [facility] office   Reason for Visit: Follow-up   Persons present on the virtual encounter, with roles: Patient and NP   Total time (minutes) spent on medical discussion: 12 minutes   Due to COVID-19, visit was conducted using virtual method.  Visit was requested by patient.  Virtual Visit via Telephone Note Due to COVID-19, visit is conducted virtually and was requested by patient.   I connected with Tammy Kramer on 07/21/20 at  1:30 PM EST by telephone and verified that I am speaking with the correct person using two identifiers.   I discussed the limitations, risks, security and privacy concerns of performing an evaluation and management service by telephone and the availability of in person appointments. I also discussed with the patient that there may be a patient responsible charge related to this service. The patient expressed understanding and agreed to proceed.  Chief Complaint  Patient presents with  . Gastroesophageal Reflux    F/u. Not having to take med daily now  . Hemorrhoids    "minor lifting I can feel them"  . Constipation    Small pieces of stool when having BM, then will get up to walk around and will go back and only small pieces will come out still     History of Present Illness: Tammy Kramer is a 70 y.o. female presenting today with a history of GERD, constipation, Grade 3 hemorrhoids s/p banding 2020. Colonoscopy due in 2023.   Historically has taken Amitiza and Metamucil. No GERD symptoms now. Can feel when she needs to have a BM and will pass but not complete. Always feels like there is a little piece that is left. Has to get up and walk around every 2-3 seconds, feels it coming, then able to pass it. Little pieces left coming out. Taking Clearlax as needed. Takes Amitiza if needed. Not taking fiber currently now.   Past Medical History:   Diagnosis Date  . Arthritis   . Back pain   . Chronic constipation   . Complication of anesthesia    difficulty waking up from anesthesia  . GERD (gastroesophageal reflux disease)   . Hemorrhoids   . Hypercholesterolemia   . Hypertension   . Seasonal allergies      Past Surgical History:  Procedure Laterality Date  . ABDOMINAL HYSTERECTOMY    . BACK SURGERY    . C4-C5 fusion    . CATARACT EXTRACTION W/PHACO Right 05/02/2017   Procedure: CATARACT EXTRACTION PHACO AND INTRAOCULAR LENS PLACEMENT (IOC);  Surgeon: Jethro Bolus, MD;  Location: [facility];  Service: Ophthalmology;  Laterality: Right;  CDE: 5.99  . CATARACT EXTRACTION W/PHACO Left 05/16/2017   Procedure: CATARACT EXTRACTION PHACO AND INTRAOCULAR LENS PLACEMENT (IOC);  Surgeon: Jethro Bolus, MD;  Location: [facility];  Service: Ophthalmology;  Laterality: Left;  CDE: 3.80  . cholecystectomy    . CHOLECYSTECTOMY    . COLONOSCOPY   10/16/2006   NUR: 3 mm polyp ablated via cold biopsy from the splenic flexure.External hemorrhoids, hyperplastic polyp  . COLONOSCOPY  Feb 2011   Dr. Jena Gauss: internal hemorrhoids, otherwise normal.   . COLONOSCOPY N/A 04/08/2014   RMR: Internal hemorrhoids; colonic diverticulosis( few  as described above) hematochezia likely secondary to hemorrhoids in the setting of constipation.  . COLONOSCOPY N/A 12/08/2016   Dr. Jena Gauss: none bleeding internal hemorrhoids which were small. Next  colonoscopy in five years.  Marland Kitchen DILATION AND CURETTAGE OF UTERUS     multiple  . ESOPHAGOGASTRODUODENOSCOPY  06/14/2006   NUR: Normal esophagogastroduodenoscopy  . ESOPHAGOGASTRODUODENOSCOPY  Feb 2011   Dr. Jena Gauss: inflamed-appearing esophagus with overlying distal esophageal erosions superimposed on noncritical Schatzki ring, s/p dilation and biopsy. Benign path  . ESOPHAGOGASTRODUODENOSCOPY N/A 12/08/2016   Dr. Jena Gauss: LA grade A esophagitis, mild schatzki's ring at the GE junction, small hiatal hernia, esophageal dilation  performed. Prevacid increased to BID.  . foot surgery     X 5-bone spurs and tendon release; morton's neuroma.  Marland Kitchen HAND SURGERY Right    X 2-carpal tunnel release and ganglion cyst removed.  Marland Kitchen HEMORRHOID BANDING  2016   Dr.Rourk  . KNEE ARTHROSCOPY WITH LATERAL MENISECTOMY Left 03/27/2015   Procedure: KNEE ARTHROSCOPY WITH LATERAL MENISECTOMY;  Surgeon: Vickki Hearing, MD;  Location: [facility];  Service: Orthopedics;  Laterality: Left;  . ROTATOR CUFF REPAIR Right      Current Meds  Medication Sig  . atorvastatin (LIPITOR) 40 MG tablet Take 40 mg by mouth daily.   . calcium carbonate (TUMS - DOSED IN MG ELEMENTAL CALCIUM) 500 MG chewable tablet Chew 1 tablet by mouth as needed.   . diclofenac (VOLTAREN) 75 MG EC tablet Take 1 tablet (75 mg total) by mouth at bedtime.  . docusate sodium (COLACE) 100 MG capsule Take 100 mg by mouth daily.  . fluticasone (FLONASE) 50 MCG/ACT nasal spray Place 1 spray into both nostrils daily.   . hydrocortisone (PROCTOZONE-HC) 2.5 % rectal cream Place 1 application rectally 2 (two) times daily. (Patient taking differently: Place 1 application rectally as needed.)  . hydrocortisone-pramoxine (PROCTOFOAM-HC) rectal foam Place 1 applicator rectally 2 (two) times daily. As needed  . ibuprofen (ADVIL,MOTRIN) 200 MG tablet Take 200 mg by mouth every 8 (eight) hours as needed (for pain).   Marland Kitchen lansoprazole (PREVACID) 30 MG capsule Take 30 mg by mouth. As needed  . loratadine (CLARITIN) 10 MG tablet Take 10 mg by mouth daily.  Marland Kitchen lubiprostone (AMITIZA) 24 MCG capsule Take 24 mcg by mouth as needed.  . Naphazoline HCl (CLEAR EYES OP) Place 1 drop 2 (two) times daily as needed into both eyes (dry eyes).   . Polyethylene Glycol 3350 (CLEARLAX PO) Take by mouth. 2-3 times per week  . sucralfate (CARAFATE) 1 g tablet Take 1 tablet (1 g total) by mouth 4 (four) times daily -  with meals and at bedtime. (Patient taking differently: Take 1 g by mouth 4 (four) times daily -   with meals and at bedtime. As needed)  . valsartan-hydrochlorothiazide (DIOVAN-HCT) 160-25 MG tablet Take 1 tablet by mouth daily.     Family History  Problem Relation Age of Onset  . Hypertension Mother   . Aneurysm Mother   . Diabetes Mother   . Heart disease Father   . Heart disease Son   . Colon cancer Brother        age 29, deceased 2 weeks later    Social History   Socioeconomic History  . Marital status: Divorced    Spouse name: Not on file  . Number of children: Not on file  . Years of education: Not on file  . Highest education level: Not on file  Occupational History  . Not on file  Tobacco Use  . Smoking status: Never Smoker  . Smokeless tobacco: Never Used  . Tobacco comment: Never smoked  Vaping Use  . Vaping Use: Never  used  Substance and Sexual Activity  . Alcohol use: No  . Drug use: No  . Sexual activity: Yes    Birth control/protection: Surgical    Comment: hyst  Other Topics Concern  . Not on file  Social History Narrative  . Not on file   Social Determinants of Health   Financial Resource Strain: Not on file  Food Insecurity: Not on file  Transportation Needs: Not on file  Physical Activity: Not on file  Stress: Not on file  Social Connections: Not on file       Review of Systems: Gen: Denies fever, chills, anorexia. Denies fatigue, weakness, weight loss.  CV: Denies chest pain, palpitations, syncope, peripheral edema, and claudication. Resp: Denies dyspnea at rest, cough, wheezing, coughing up blood, and pleurisy. GI: see HPI Derm: Denies rash, itching, dry skin Psych: Denies depression, anxiety, memory loss, confusion. No homicidal or suicidal ideation.  Heme: Denies bruising, bleeding, and enlarged lymph nodes.  Observations/Objective: No distress. Unable to perform physical exam due to telephone encounter. No video available.   Assessment and Plan: Very pleasant 70 year old female with a history of GERD, constipation, Grade 3  hemorrhoids s/p banding 2020. Colonoscopy due in 2023.Presenting now with constipation not ideally managed.   Historically has done well with daily fiber along with Amitiza. I have asked that she return to taking fiber, Amitiza as needed (novel dosing but works well for her), and call if no improvement.  GERD symptoms quiescent now.   Return in 6 months.   Follow Up Instructions:    I discussed the assessment and treatment plan with the patient. The patient was provided an opportunity to ask questions and all were answered. The patient agreed with the plan and demonstrated an understanding of the instructions.   The patient was advised to call back or seek an in-person evaluation if the symptoms worsen or if the condition fails to improve as anticipated.  I provided 12 minutes of  Non face-to-face time during this TELEPHONE encounter.  Gelene Mink, PhD, ANP-BC [facility]

## 2020-08-02 DIAGNOSIS — I10 Essential (primary) hypertension: Secondary | ICD-10-CM | POA: Diagnosis not present

## 2020-08-02 DIAGNOSIS — K219 Gastro-esophageal reflux disease without esophagitis: Secondary | ICD-10-CM | POA: Diagnosis not present

## 2020-08-30 DIAGNOSIS — I10 Essential (primary) hypertension: Secondary | ICD-10-CM | POA: Diagnosis not present

## 2020-08-30 DIAGNOSIS — M159 Polyosteoarthritis, unspecified: Secondary | ICD-10-CM | POA: Diagnosis not present

## 2020-09-30 DIAGNOSIS — K219 Gastro-esophageal reflux disease without esophagitis: Secondary | ICD-10-CM | POA: Diagnosis not present

## 2020-09-30 DIAGNOSIS — I10 Essential (primary) hypertension: Secondary | ICD-10-CM | POA: Diagnosis not present

## 2020-10-30 DIAGNOSIS — M159 Polyosteoarthritis, unspecified: Secondary | ICD-10-CM | POA: Diagnosis not present

## 2020-10-30 DIAGNOSIS — I10 Essential (primary) hypertension: Secondary | ICD-10-CM | POA: Diagnosis not present

## 2020-11-19 ENCOUNTER — Ambulatory Visit: Payer: Medicare Other | Admitting: Podiatry

## 2020-11-19 ENCOUNTER — Encounter: Payer: Self-pay | Admitting: Podiatry

## 2020-11-19 ENCOUNTER — Other Ambulatory Visit: Payer: Self-pay

## 2020-11-19 ENCOUNTER — Ambulatory Visit (INDEPENDENT_AMBULATORY_CARE_PROVIDER_SITE_OTHER): Payer: Medicare Other

## 2020-11-19 DIAGNOSIS — M79674 Pain in right toe(s): Secondary | ICD-10-CM | POA: Diagnosis not present

## 2020-11-19 DIAGNOSIS — M25871 Other specified joint disorders, right ankle and foot: Secondary | ICD-10-CM | POA: Diagnosis not present

## 2020-11-19 DIAGNOSIS — M7989 Other specified soft tissue disorders: Secondary | ICD-10-CM | POA: Diagnosis not present

## 2020-11-19 DIAGNOSIS — M21611 Bunion of right foot: Secondary | ICD-10-CM | POA: Diagnosis not present

## 2020-11-20 DIAGNOSIS — M79674 Pain in right toe(s): Secondary | ICD-10-CM | POA: Diagnosis not present

## 2020-11-20 DIAGNOSIS — M7989 Other specified soft tissue disorders: Secondary | ICD-10-CM | POA: Diagnosis not present

## 2020-11-21 LAB — BASIC METABOLIC PANEL
BUN/Creatinine Ratio: 22 (ref 12–28)
BUN: 24 mg/dL (ref 8–27)
CO2: 22 mmol/L (ref 20–29)
Calcium: 10.4 mg/dL — ABNORMAL HIGH (ref 8.7–10.3)
Chloride: 104 mmol/L (ref 96–106)
Creatinine, Ser: 1.11 mg/dL — ABNORMAL HIGH (ref 0.57–1.00)
Glucose: 136 mg/dL — ABNORMAL HIGH (ref 65–99)
Potassium: 4.1 mmol/L (ref 3.5–5.2)
Sodium: 141 mmol/L (ref 134–144)
eGFR: 53 mL/min/{1.73_m2} — ABNORMAL LOW (ref >59)

## 2020-11-21 LAB — ARTHRITIS PANEL
Anti Nuclear Antibody (ANA): NEGATIVE
Rheumatoid fact SerPl-aCnc: <10 IU/mL (ref <14.0)
Sed Rate: 5 mm/hr (ref 0–40)
Uric Acid: 7.2 mg/dL (ref 3.1–7.9)

## 2020-11-23 ENCOUNTER — Encounter: Payer: Self-pay | Admitting: Podiatry

## 2020-11-23 NOTE — Progress Notes (Signed)
  Subjective:  Patient ID: Tammy Kramer, female    DOB: Feb 15, 1951,  MRN: [id number]  Chief Complaint  Patient presents with  . Bunions    Painful bunion right    70 y.o. female presents with the above complaint. History confirmed with patient.  She had bunion surgery left foot years ago.  The pain on the right foot began increasingly getting worse over the last month  Objective:  Physical Exam: warm, good capillary refill, no trophic changes or ulcerative lesions, normal DP and PT pulses and normal sensory exam.  She has minimal to no hallux valgus deformity on the right side.  She does have palpation and swelling around the joint she also has pain on the sesamoid complex that is nonspecific .  Radiographs: X-ray of the right foot: no fracture, dislocation, swelling or degenerative changes noted Assessment:   1. Pain and swelling of toe of right foot   2. Sesamoiditis of right foot      Plan:  Patient was evaluated and treated and all questions answered.  Do not think this pain is from bunions.  She likely either has gout which I have ordered lab work to evaluate for or possibly sesamoiditis.  Recommend rest and immobilization in a CAM boot.  Follow-up in 1 month to reevaluate and review lab work  Return in about 4 weeks (around 12/17/2020).

## 2020-11-30 DIAGNOSIS — I10 Essential (primary) hypertension: Secondary | ICD-10-CM | POA: Diagnosis not present

## 2020-11-30 DIAGNOSIS — K219 Gastro-esophageal reflux disease without esophagitis: Secondary | ICD-10-CM | POA: Diagnosis not present

## 2020-12-03 ENCOUNTER — Ambulatory Visit: Payer: Medicare Other | Admitting: Orthopedic Surgery

## 2020-12-03 ENCOUNTER — Ambulatory Visit: Payer: Medicare Other

## 2020-12-03 ENCOUNTER — Other Ambulatory Visit: Payer: Self-pay

## 2020-12-03 ENCOUNTER — Encounter: Payer: Self-pay | Admitting: Orthopedic Surgery

## 2020-12-03 ENCOUNTER — Telehealth: Payer: Self-pay | Admitting: *Deleted

## 2020-12-03 VITALS — BP 142/87 | HR 80 | Ht 65.0 in | Wt 180.0 lb

## 2020-12-03 DIAGNOSIS — M4722 Other spondylosis with radiculopathy, cervical region: Secondary | ICD-10-CM | POA: Diagnosis not present

## 2020-12-03 DIAGNOSIS — G8929 Other chronic pain: Secondary | ICD-10-CM

## 2020-12-03 DIAGNOSIS — M25511 Pain in right shoulder: Secondary | ICD-10-CM | POA: Diagnosis not present

## 2020-12-03 DIAGNOSIS — M542 Cervicalgia: Secondary | ICD-10-CM

## 2020-12-03 MED ORDER — GABAPENTIN 100 MG PO CAPS: 100.0000 mg | ORAL_CAPSULE | Freq: Three times a day (TID) | ORAL | 2 refills | Status: DC

## 2020-12-03 MED ORDER — PREDNISONE 10 MG (48) PO TBPK: ORAL_TABLET | Freq: Every day | ORAL | 0 refills | Status: DC

## 2020-12-03 NOTE — Progress Notes (Signed)
NEW PROBLEM//OFFICE VISIT  Summary assessment and plan:  Probable autofusion c4-5,PATIENT DOESN'T REMEMBER SURGERY   Start steroids and gabapentin and therapy   Return in 4 weeks for re - evaluation   Chief Complaint  Patient presents with   Neck Pain    Has pain neck into entire right arm has tingling in hand    Shoulder Pain    Right shoulder pain    70 year old female presents with right shoulder and neck pain radiating down into her right upper extremity associated with weakness and numbness up to the level of the forearm  Pain 2018 CT scan showed probable autofusion C4-5 region.  Indicates her symptoms began after her visit to the podiatrist.    MEDICAL DECISION MAKING  A.  Encounter Diagnoses  Name Primary?   Chronic right shoulder pain    Acute neck pain    Cervical spondylosis with radiculopathy Yes    B. DATA ANALYSED:   IMAGING:  C-spine images show severe spondylosis from C4-6  C4 and 5 fused together there is no hardware so this is probably an autofusion  It is noted on CT scan from 2018   Interpretation of images: external CT from 2018    CT CERVICAL SPINE FINDINGS 2018   Alignment: No traumatic subluxation. C4 has been fused to C5 in a position of 2 mm anterolisthesis. There is mild straightening of the normal cervical lordosis. Trace anterolisthesis C6-C7 and C7-T1 is facet mediated.   Skull base and vertebrae: No acute fracture. No primary bone lesion or focal pathologic process.   Soft tissues and spinal canal: No prevertebral fluid or swelling. No visible canal hematoma.   Disc levels: Significant adjacent segment disease at C5-C6, disc space narrowing and osseous spurring. Solid appearing arthrodesis C4-C5. Mild disc space narrowing C3-C4.   Upper chest: Negative.   Other: None.   IMPRESSION: No skull fracture or intracranial hemorrhage.   No cervical spine fracture or traumatic subluxation. Orders: PT  Outside records  reviewed: NO   C. MANAGEMENT   PT STEROIDS  GABAPENTIN   Meds ordered this encounter  Medications   predniSONE (STERAPRED UNI-PAK 48 TAB) 10 MG (48) TBPK tablet    Sig: Take by mouth daily.    Dispense:  48 tablet    Refill:  0   gabapentin (NEURONTIN) 100 MG capsule    Sig: Take 1 capsule (100 mg total) by mouth 3 (three) times daily.    Dispense:  60 capsule    Refill:  2      BP (!) 142/87   Pulse 80   Ht 5\' 5"  (1.651 m)   Wt 180 lb (81.6 kg)   BMI 29.95 kg/m    General appearance: Well-developed well-nourished no gross deformities  Cardiovascular normal pulse and perfusion normal color without edema  Neurologically no sensation loss or deficits or pathologic reflexes  Psychological: Awake alert and oriented x3 mood and affect normal  Skin no lacerations or ulcerations no nodularity no palpable masses, no erythema or nodularity  Musculoskeletal:   C-SPINE: TENDER RIGHT SIDE C SPINE; CREPITANCE, DECREASED ROM; FROM OF THE SHOULDER   ROS  WEAKNESS AND NUMBNESS   Past Medical History:  Diagnosis Date   Arthritis    Back pain    Chronic constipation    Complication of anesthesia    difficulty waking up from anesthesia   GERD (gastroesophageal reflux disease)    Hemorrhoids    Hypercholesterolemia    Hypertension    Seasonal allergies  Past Surgical History:  Procedure Laterality Date   ABDOMINAL HYSTERECTOMY     BACK SURGERY     C4-C5 fusion     CATARACT EXTRACTION W/PHACO Right 05/02/2017   Procedure: CATARACT EXTRACTION PHACO AND INTRAOCULAR LENS PLACEMENT (IOC);  Surgeon: Jethro Bolus, MD;  Location: [facility];  Service: Ophthalmology;  Laterality: Right;  CDE: 5.99   CATARACT EXTRACTION W/PHACO Left 05/16/2017   Procedure: CATARACT EXTRACTION PHACO AND INTRAOCULAR LENS PLACEMENT (IOC);  Surgeon: Jethro Bolus, MD;  Location: [facility];  Service: Ophthalmology;  Laterality: Left;  CDE: 3.80   cholecystectomy     CHOLECYSTECTOMY      COLONOSCOPY   10/16/2006   NUR: 3 mm polyp ablated via cold biopsy from the splenic flexure.External hemorrhoids, hyperplastic polyp   COLONOSCOPY  Feb 2011   Dr. Jena Gauss: internal hemorrhoids, otherwise normal.    COLONOSCOPY N/A 04/08/2014   RMR: Internal hemorrhoids; colonic diverticulosis( few  as described above) hematochezia likely secondary to hemorrhoids in the setting of constipation.   COLONOSCOPY N/A 12/08/2016   Dr. Jena Gauss: none bleeding internal hemorrhoids which were small. Next colonoscopy in five years.   DILATION AND CURETTAGE OF UTERUS     multiple   ESOPHAGOGASTRODUODENOSCOPY  06/14/2006   NUR: Normal esophagogastroduodenoscopy   ESOPHAGOGASTRODUODENOSCOPY  Feb 2011   Dr. Jena Gauss: inflamed-appearing esophagus with overlying distal esophageal erosions superimposed on noncritical Schatzki ring, s/p dilation and biopsy. Benign path   ESOPHAGOGASTRODUODENOSCOPY N/A 12/08/2016   Dr. Jena Gauss: LA grade A esophagitis, mild schatzki's ring at the GE junction, small hiatal hernia, esophageal dilation performed. Prevacid increased to BID.   foot surgery     X 5-bone spurs and tendon release; morton's neuroma.   HAND SURGERY Right    X 2-carpal tunnel release and ganglion cyst removed.   HEMORRHOID BANDING  2016   Dr.Rourk   KNEE ARTHROSCOPY WITH LATERAL MENISECTOMY Left 03/27/2015   Procedure: KNEE ARTHROSCOPY WITH LATERAL MENISECTOMY;  Surgeon: Vickki Hearing, MD;  Location: [facility];  Service: Orthopedics;  Laterality: Left;   ROTATOR CUFF REPAIR Right     Family History  Problem Relation Age of Onset   Hypertension Mother    Aneurysm Mother    Diabetes Mother    Heart disease Father    Heart disease Son    Colon cancer Brother        age 16, deceased 2 weeks later   Social History   Tobacco Use   Smoking status: Never   Smokeless tobacco: Never   Tobacco comments:    Never smoked  Vaping Use   Vaping Use: Never used  Substance Use Topics   Alcohol use: No   Drug  use: No    Allergies  Allergen Reactions   Codeine Other (See Comments)    Hallucinations/dizziness/"out of it"   Meperidine Hcl Other (See Comments)    Dizziness/headaches/hallucinations    Current Meds  Medication Sig   predniSONE (STERAPRED UNI-PAK 48 TAB) 10 MG (48) TBPK tablet Take by mouth daily.    Fuller Canada, MD  12/03/2020 11:09 AM

## 2020-12-03 NOTE — Telephone Encounter (Signed)
Patient is calling to request her test results.    Please advise

## 2020-12-03 NOTE — Patient Instructions (Signed)
USE HEAT 3 X A DAY   START MEDIACTION  Meds ordered this encounter  Medications   predniSONE (STERAPRED UNI-PAK 48 TAB) 10 MG (48) TBPK tablet    Sig: Take by mouth daily.    Dispense:  48 tablet    Refill:  0   gabapentin (NEURONTIN) 100 MG capsule    Sig: Take 1 capsule (100 mg total) by mouth 3 (three) times daily.    Dispense:  60 capsule    Refill:  2    START THERAPY

## 2020-12-17 ENCOUNTER — Ambulatory Visit: Payer: Medicare Other | Admitting: Podiatry

## 2020-12-17 ENCOUNTER — Other Ambulatory Visit: Payer: Self-pay

## 2020-12-17 ENCOUNTER — Encounter: Payer: Self-pay | Admitting: Podiatry

## 2020-12-17 DIAGNOSIS — M109 Gout, unspecified: Secondary | ICD-10-CM | POA: Diagnosis not present

## 2020-12-17 MED ORDER — COLCHICINE 0.6 MG PO TABS: ORAL_TABLET | ORAL | 2 refills | Status: DC

## 2020-12-17 NOTE — Progress Notes (Signed)
  Subjective:  Patient ID: Tammy Kramer, female    DOB: [birth year],  MRN: [id number]  Chief Complaint  Patient presents with   Toe Pain    Pt states improved but still not 100%     70 y.o. female returns for follow-up with the above complaint. History confirmed with patient.  Feeling much better than she was  Objective:  Physical Exam: warm, good capillary refill, no trophic changes or ulcerative lesions, normal DP and PT pulses and normal sensory exam.  She has minimal to no hallux valgus deformity on the right side.   minimal pain to extension and no swelling today around the first MTPJ  Radiographs: X-ray of the right foot: no fracture, dislocation, swelling or degenerative changes noted  Uric acid borderline 7.2 Assessment:   1. Acute gout of right foot, unspecified cause      Plan:  Patient was evaluated and treated and all questions answered.  Reviewed lab work with her.  I think this is in fact gout that she is having.  Discussed low purine diet plan with her and possible triggers for gout.  She will continue to monitor.  Sent her prescription for colchicine to have on hand as a precaution.  She will let me know if she has any further flares and will consider injection therapy as needed.  Discussed with her if this becomes a chronic recurrent issue she may need chronic medications from her PCP for uric acid lowering therapy.  Return if symptoms worsen or fail to improve.

## 2020-12-29 ENCOUNTER — Encounter (HOSPITAL_COMMUNITY): Payer: Self-pay | Admitting: Physical Therapy

## 2020-12-29 ENCOUNTER — Ambulatory Visit (HOSPITAL_COMMUNITY): Payer: Medicare Other | Attending: Orthopedic Surgery | Admitting: Physical Therapy

## 2020-12-29 ENCOUNTER — Other Ambulatory Visit: Payer: Self-pay

## 2020-12-29 DIAGNOSIS — M79601 Pain in right arm: Secondary | ICD-10-CM | POA: Diagnosis not present

## 2020-12-29 DIAGNOSIS — M25611 Stiffness of right shoulder, not elsewhere classified: Secondary | ICD-10-CM | POA: Insufficient documentation

## 2020-12-29 DIAGNOSIS — M542 Cervicalgia: Secondary | ICD-10-CM

## 2020-12-29 NOTE — Therapy (Signed)
[facility] [address] [place], [place], [address] Phone: [phone]   Fax:  [phone]  Physical Therapy Evaluation  Patient Details  Name: Tammy Kramer MRN: [id number] Date of Birth: 11/16/1950 Referring Provider (PT): Fuller CanadaStanley Harrison   Encounter Date: 12/29/2020   PT End of Session - 12/29/20 1117     Visit Number 1    Number of Visits 12    Date for PT Re-Evaluation 02/09/21    Authorization Type UHC Medicare no auth    Progress Note Due on Visit 10    PT Start Time 1120    PT Stop Time 1200    PT Time Calculation (min) 40 min    Activity Tolerance Patient limited by pain    Behavior [facility] tasks assessed/performed             Past Medical History:  Diagnosis Date   Arthritis    Back pain    Chronic constipation    Complication of anesthesia    difficulty waking up from anesthesia   GERD (gastroesophageal reflux disease)    Hemorrhoids    Hypercholesterolemia    Hypertension    Seasonal allergies     Past Surgical History:  Procedure Laterality Date   ABDOMINAL HYSTERECTOMY     BACK SURGERY     C4-C5 fusion     CATARACT EXTRACTION W/PHACO Right 05/02/2017   Procedure: CATARACT EXTRACTION PHACO AND INTRAOCULAR LENS PLACEMENT (IOC);  Surgeon: Jethro BolusShapiro, Mark, MD;  Location: [facility];  Service: Ophthalmology;  Laterality: Right;  CDE: 5.99   CATARACT EXTRACTION W/PHACO Left 05/16/2017   Procedure: CATARACT EXTRACTION PHACO AND INTRAOCULAR LENS PLACEMENT (IOC);  Surgeon: Jethro BolusShapiro, Mark, MD;  Location: [facility];  Service: Ophthalmology;  Laterality: Left;  CDE: 3.80   cholecystectomy     CHOLECYSTECTOMY     COLONOSCOPY   10/16/2006   NUR: 3 mm polyp ablated via cold biopsy from the splenic flexure.External hemorrhoids, hyperplastic polyp   COLONOSCOPY  Feb 2011   Dr. Jena Gaussourk: internal hemorrhoids, otherwise normal.    COLONOSCOPY N/A 04/08/2014   RMR: Internal hemorrhoids; colonic diverticulosis(  few  as described above) hematochezia likely secondary to hemorrhoids in the setting of constipation.   COLONOSCOPY N/A 12/08/2016   Dr. Jena Gaussourk: none bleeding internal hemorrhoids which were small. Next colonoscopy in five years.   DILATION AND CURETTAGE OF UTERUS     multiple   ESOPHAGOGASTRODUODENOSCOPY  06/14/2006   NUR: Normal esophagogastroduodenoscopy   ESOPHAGOGASTRODUODENOSCOPY  Feb 2011   Dr. Jena Gaussourk: inflamed-appearing esophagus with overlying distal esophageal erosions superimposed on noncritical Schatzki ring, s/p dilation and biopsy. Benign path   ESOPHAGOGASTRODUODENOSCOPY N/A 12/08/2016   Dr. Jena Gaussourk: LA grade A esophagitis, mild schatzki's ring at the GE junction, small hiatal hernia, esophageal dilation performed. Prevacid increased to BID.   foot surgery     X 5-bone spurs and tendon release; morton's neuroma.   HAND SURGERY Right    X 2-carpal tunnel release and ganglion cyst removed.   HEMORRHOID BANDING  2016   Dr.Rourk   KNEE ARTHROSCOPY WITH LATERAL MENISECTOMY Left 03/27/2015   Procedure: KNEE ARTHROSCOPY WITH LATERAL MENISECTOMY;  Surgeon: Vickki HearingStanley E Harrison, MD;  Location: [facility];  Service: Orthopedics;  Laterality: Left;   ROTATOR CUFF REPAIR Right     There were no vitals filed for this visit.    Subjective Assessment - 12/29/20 1126     Subjective States that about 1.5 months ago she was out  trimming bushes and started to have pain in her right arm and neck. States she has a history of neck and shoulder issues but everything has been managed up until 1.5 months ago. States she has been on steroids, medication and takes naproxen. Reports cooking, cleaning and using her right arm is very painful and difficult. Neck motion is also painful and difficult. States she can find minimal relief with tucking arm up against her body.    Pertinent History hx of lumbar surgery,  hx of Rotator cuff repair and neck surgery 10+ years ago    Limitations House hold activities;Lifting     Patient Stated Goals to have less pain    Currently in Pain? Yes    Pain Score 6     Pain Location Neck    Pain Orientation Right    Pain Descriptors / Indicators Aching;Tightness;Stabbing    Pain Radiating Towards down the right arm    Aggravating Factors  movement of the arm, pulling    Pain Relieving Factors rest, medication                [facility] PT Assessment - 12/29/20 0001       Assessment   Medical Diagnosis neck and right arm pain    Referring Provider (PT) Fuller Canada    Hand Dominance Right    Next MD Visit as needed    Prior Therapy yes      Balance Screen   Has the patient fallen in the past 6 months No      Prior Function   Level of Independence Independent      Cognition   Overall Cognitive Status Within Functional Limits for tasks assessed      Observation/Other Assessments   Focus on Therapeutic Outcomes (FOTO)  39%function      Posture/Postural Control   Posture/Postural Control Postural limitations    Postural Limitations Forward head;Rounded Shoulders;Posterior pelvic tilt      ROM / Strength   AROM / PROM / Strength AROM;Strength      AROM   Overall AROM Comments measured in sitting    AROM Assessment Site Shoulder;Cervical    Right/Left Shoulder Right;Left    Right Shoulder Flexion 110 Degrees   pain in right shoulder and arm, PROM   Right Shoulder ABduction 130 Degrees   pain radiating into right fingers   Left Shoulder Flexion 160 Degrees   no pain   Left Shoulder ABduction --   WNL no paiin   Cervical Flexion WFL    Cervical Extension 25   pulling on right neck   Cervical - Right Side Bend 23   no change in symptoms   Cervical - Left Side Bend 18   slight pain on right side of neck - popping noted   Cervical - Right Rotation 60   pulling in neck and on right side   Cervical - Left Rotation 52   pulling in neck and on right side     Strength   Strength Assessment Site Shoulder    Right/Left Shoulder Left;Right       Palpation   Spinal mobility hypomobility throuhgout cervical spine - guarding noted    Palpation comment Tenderness to palpation and increased resting tone noted in right upper trap, periscapular musculature, biceps and pec, lats and subscap  and cervical paraspinals.      Special Tests    Special Tests Cervical    Cervical Tests Spurling's;Dictraction      Spurling's  Findings Positive    Side Right      Distraction Test   Comment guarding - unable to test                        Objective measurements completed on examination: See above findings.       [facility] Adult PT Treatment/Exercise - 12/29/20 0001       Exercises   Exercises Neck      Neck Exercises: Supine   Neck Retraction 15 reps;5 secs    Cervical Rotation Both;15 reps   5 " holds                   PT Education - 12/29/20 1203     Education Details on anatomy, on HEP, POC and current presenation.    Person(s) Educated Patient    Methods Explanation    Comprehension Verbalized understanding              PT Short Term Goals - 12/29/20 1157       PT SHORT TERM GOAL #1   Title Patient will demonstrate painfree cervical ROM    Time 3    Period Weeks    Status New    Target Date 01/19/21      PT SHORT TERM GOAL #2   Title Patient will report at least 50% improvement in overall symptoms and/or function to demonstrate improved functional mobility    Time 3    Period Weeks    Status New    Target Date 01/19/21      PT SHORT TERM GOAL #3   Title Patient will be independent in self management strategies to improve quality of life and functional outcomes.    Time 3    Period Weeks    Status New    Target Date 01/19/21               PT Long Term Goals - 12/29/20 1200       PT LONG TERM GOAL #1   Title Patient will report at least 75% improvement in overall symptoms and/or function to demonstrate improved functional mobility    Time 6    Period Weeks    Status  New    Target Date 02/09/21      PT LONG TERM GOAL #2   Title Patient will be able to reach overhead with right arm without difficulties    Time 6    Period Weeks    Status New    Target Date 02/09/21      PT LONG TERM GOAL #3   Title Patient will improve on FOTO score to meet predicted outcomes to demonstrate improved functional mobility.    Time 6    Period Weeks    Status New    Target Date 02/09/21      PT LONG TERM GOAL #4   Title Patient will be able to vacuum without difficutlies to improve ability to clean house    Time 6    Period Weeks    Status New    Target Date 02/09/21                    Plan - 12/29/20 1152     Clinical Impression Statement Patient is a 70 y.o. female who presents to physical therapy with complaint of neck and right shoulder pain. Patient presents with significant muscle guarding in right shoulder.  Patient demonstrates decreased strength,  ROM restriction, reduced flexibility, increased tenderness to palpation and postural abnormalities which are likely contributing to symptoms of pain and are negatively impacting patient ability to perform ADLs. Patient will benefit from skilled physical therapy services to address these deficits to reduce pain and improve level of function with ADLs    Personal Factors and Comorbidities Comorbidity 1;Comorbidity 2    Comorbidities hx of lumbar/neck/shoulder surgery 10+ years ago    Examination-Activity Limitations Lift;Reach Overhead;Hygiene/Grooming;Dressing;Sleep    Examination-Participation Restrictions Driving;Community Activity;Shop;Cleaning;Meal Prep    Stability/Clinical Decision Making Stable/Uncomplicated    Clinical Decision Making Low    Rehab Potential Good    PT Frequency 2x / week    PT Duration 6 weeks    PT Treatment/Interventions ADLs/Self Care Home Management    PT Next Visit Plan neck and shoulder ROM, STM as indicated, neck/thoracic mobilizations, posture exercise    PT Home  Exercise Plan chin tucks, cervical ROT    Consulted and Agree with Plan of Care Patient             Patient will benefit from skilled therapeutic intervention in order to improve the following deficits and impairments:  Hypomobility, Decreased strength, Pain, Decreased range of motion, Impaired UE functional use, Postural dysfunction  Visit Diagnosis: Cervicalgia  Pain in right arm  Decreased right shoulder range of motion     Problem List Patient Active Problem List   Diagnosis Date Noted   Grade III hemorrhoids 04/11/2019   Lumbar disc disease 03/11/2019   Grade II hemorrhoids 03/05/2019   Vaginal discharge 06/15/2016   Lateral meniscus tear, current    Hemorrhoids 07/29/2014   Rectal bleeding 03/24/2014   Constipation 03/24/2014   Hypertension 03/14/2011   GERD (gastroesophageal reflux disease) 03/14/2011   SHOULDER PAIN 08/10/2009   IMPINGEMENT SYNDROME 08/10/2009   RUPTURE ROTATOR CUFF 08/10/2009   GERD 07/28/2009   HEMATOCHEZIA 07/28/2009   WEIGHT LOSS, ABNORMAL 07/28/2009   DYSPHAGIA UNSPECIFIED 07/28/2009   CHANGE IN BOWELS 07/28/2009   EPIGASTRIC PAIN 07/28/2009   LOW BACK PAIN 06/02/2008  12:07 PM, 12/29/20 Tereasa Coop, DPT Physical Therapy with [facility]  [phone] office   [facility] [address] [place], [place], [address] Phone: [phone]   Fax:  [phone]  Name: Tammy Kramer MRN: [id number] Date of Birth: 06-12-1951

## 2020-12-30 DIAGNOSIS — M159 Polyosteoarthritis, unspecified: Secondary | ICD-10-CM | POA: Diagnosis not present

## 2020-12-30 DIAGNOSIS — K219 Gastro-esophageal reflux disease without esophagitis: Secondary | ICD-10-CM | POA: Diagnosis not present

## 2020-12-30 DIAGNOSIS — M109 Gout, unspecified: Secondary | ICD-10-CM | POA: Diagnosis not present

## 2020-12-30 DIAGNOSIS — I10 Essential (primary) hypertension: Secondary | ICD-10-CM | POA: Diagnosis not present

## 2021-01-04 ENCOUNTER — Ambulatory Visit (HOSPITAL_COMMUNITY): Payer: Medicare Other | Admitting: Physical Therapy

## 2021-01-04 ENCOUNTER — Other Ambulatory Visit: Payer: Self-pay

## 2021-01-04 DIAGNOSIS — M79601 Pain in right arm: Secondary | ICD-10-CM | POA: Diagnosis not present

## 2021-01-04 DIAGNOSIS — M25611 Stiffness of right shoulder, not elsewhere classified: Secondary | ICD-10-CM | POA: Diagnosis not present

## 2021-01-04 DIAGNOSIS — M542 Cervicalgia: Secondary | ICD-10-CM | POA: Diagnosis not present

## 2021-01-04 NOTE — Therapy (Signed)
[facility] [address] [place], [place], [address] Phone: [phone]   Fax:  [phone]  Physical Therapy Treatment  Patient Details  Name: Tammy Kramer MRN: [id number] Date of Birth: 03/31/1951 Referring Provider (PT): Fuller Canada   Encounter Date: 01/04/2021   PT End of Session - 01/04/21 1654     Visit Number 2    Number of Visits 12    Date for PT Re-Evaluation 02/09/21    Authorization Type UHC Medicare no auth    Progress Note Due on Visit 10    PT Start Time 1540    PT Stop Time 1622    PT Time Calculation (min) 42 min    Activity Tolerance Patient limited by pain    Behavior During Therapy [facility] for tasks assessed/performed             Past Medical History:  Diagnosis Date   Arthritis    Back pain    Chronic constipation    Complication of anesthesia    difficulty waking up from anesthesia   GERD (gastroesophageal reflux disease)    Hemorrhoids    Hypercholesterolemia    Hypertension    Seasonal allergies     Past Surgical History:  Procedure Laterality Date   ABDOMINAL HYSTERECTOMY     BACK SURGERY     C4-C5 fusion     CATARACT EXTRACTION W/PHACO Right 05/02/2017   Procedure: CATARACT EXTRACTION PHACO AND INTRAOCULAR LENS PLACEMENT (IOC);  Surgeon: Jethro Bolus, MD;  Location: [facility];  Service: Ophthalmology;  Laterality: Right;  CDE: 5.99   CATARACT EXTRACTION W/PHACO Left 05/16/2017   Procedure: CATARACT EXTRACTION PHACO AND INTRAOCULAR LENS PLACEMENT (IOC);  Surgeon: Jethro Bolus, MD;  Location: [facility];  Service: Ophthalmology;  Laterality: Left;  CDE: 3.80   cholecystectomy     CHOLECYSTECTOMY     COLONOSCOPY   10/16/2006   NUR: 3 mm polyp ablated via cold biopsy from the splenic flexure.External hemorrhoids, hyperplastic polyp   COLONOSCOPY  Feb 2011   Dr. Jena Gauss: internal hemorrhoids, otherwise normal.    COLONOSCOPY N/A 04/08/2014   RMR: Internal hemorrhoids; colonic diverticulosis( few   as described above) hematochezia likely secondary to hemorrhoids in the setting of constipation.   COLONOSCOPY N/A 12/08/2016   Dr. Jena Gauss: none bleeding internal hemorrhoids which were small. Next colonoscopy in five years.   DILATION AND CURETTAGE OF UTERUS     multiple   ESOPHAGOGASTRODUODENOSCOPY  06/14/2006   NUR: Normal esophagogastroduodenoscopy   ESOPHAGOGASTRODUODENOSCOPY  Feb 2011   Dr. Jena Gauss: inflamed-appearing esophagus with overlying distal esophageal erosions superimposed on noncritical Schatzki ring, s/p dilation and biopsy. Benign path   ESOPHAGOGASTRODUODENOSCOPY N/A 12/08/2016   Dr. Jena Gauss: LA grade A esophagitis, mild schatzki's ring at the GE junction, small hiatal hernia, esophageal dilation performed. Prevacid increased to BID.   foot surgery     X 5-bone spurs and tendon release; morton's neuroma.   HAND SURGERY Right    X 2-carpal tunnel release and ganglion cyst removed.   HEMORRHOID BANDING  2016   Dr.Rourk   KNEE ARTHROSCOPY WITH LATERAL MENISECTOMY Left 03/27/2015   Procedure: KNEE ARTHROSCOPY WITH LATERAL MENISECTOMY;  Surgeon: Vickki Hearing, MD;  Location: [facility];  Service: Orthopedics;  Laterality: Left;   ROTATOR CUFF REPAIR Right     There were no vitals filed for this visit.   Subjective Assessment - 01/04/21 1543     Subjective pt states 6-7 pain in her cervical area and pain  in the back of her head when she first gets up in the morning.  States she had an incident with a can of food and the the can opener where she suddently outstretched her Rt UE to [place] retrieve something that was falling off her counter and the elbow "popped" and has not hurt since in her elbow.  Still has radiating pain down her Rt UE though.    Currently in Pain? Yes    Pain Score 6     Pain Location Neck    Pain Orientation Right    Pain Descriptors / Indicators Aching;Radiating                               [facility] Adult PT Treatment/Exercise -  01/04/21 0001       Neck Exercises: Seated   Neck Retraction 10 reps    Cervical Rotation 5 reps    Lateral Flexion 5 reps    W Back 10 reps    Shoulder Rolls 10 reps    Shoulder Rolls Limitations backward    Shoulder Flexion 10 reps    Shoulder ABduction 10 reps    Shoulder Abduction Limitations jumping jack      Manual Therapy   Manual Therapy Soft tissue mobilization    Manual therapy comments completed seperately from all other skilled interventions    Soft tissue mobilization seated to Rt cervical UT, scalenes and paraspinasl                    PT Education - 01/04/21 1654     Education Details reviewed goals, HEP and POC moving foward.    Person(s) Educated Patient    Methods Explanation    Comprehension Verbalized understanding              PT Short Term Goals - 01/04/21 1603       PT SHORT TERM GOAL #1   Title Patient will demonstrate painfree cervical ROM    Time 3    Period Weeks    Status On-going    Target Date 01/19/21      PT SHORT TERM GOAL #2   Title Patient will report at least 50% improvement in overall symptoms and/or function to demonstrate improved functional mobility    Time 3    Period Weeks    Status On-going    Target Date 01/19/21      PT SHORT TERM GOAL #3   Title Patient will be independent in self management strategies to improve quality of life and functional outcomes.    Time 3    Period Weeks    Status On-going    Target Date 01/19/21               PT Long Term Goals - 01/04/21 1603       PT LONG TERM GOAL #1   Title Patient will report at least 75% improvement in overall symptoms and/or function to demonstrate improved functional mobility    Time 6    Period Weeks    Status On-going      PT LONG TERM GOAL #2   Title Patient will be able to reach overhead with right arm without difficulties    Time 6    Period Weeks    Status On-going      PT LONG TERM GOAL #3   Title Patient will improve on  FOTO score to meet predicted  outcomes to demonstrate improved functional mobility.    Time 6    Period Weeks    Status On-going      PT LONG TERM GOAL #4   Title Patient will be able to vacuum without difficutlies to improve ability to clean house    Time 6    Period Weeks    Status On-going                   Plan - 01/04/21 1653     Clinical Impression Statement Reviewed goals, HEP and POC moving forward.  Added seated postural and UE ROM exercises with cues for general form.  Pt with tendency to pull head forward with UE flexion but able to stabilize with cues.  Full ROM noted today for Rt UE in both flexion and abduction.  Manual completed to cervical musculature with tightness throughout the Rt upper trap.  Pt reported improvement with reduction in pain at end of session.    Personal Factors and Comorbidities Comorbidity 1;Comorbidity 2    Comorbidities hx of lumbar/neck/shoulder surgery 10+ years ago    Examination-Activity Limitations Lift;Reach Overhead;Hygiene/Grooming;Dressing;Sleep    Examination-Participation Restrictions Driving;Community Activity;Shop;Cleaning;Meal Prep    Stability/Clinical Decision Making Stable/Uncomplicated    Rehab Potential Good    PT Frequency 2x / week    PT Duration 6 weeks    PT Treatment/Interventions ADLs/Self Care Home Management    PT Next Visit Plan contiue to improve neck and shoulder ROM.  Continue STM and complete neck/thoracic mobilizations as needed.  Progress postural strength and awareness.    PT Home Exercise Plan chin tucks, cervical ROM    Consulted and Agree with Plan of Care Patient             Patient will benefit from skilled therapeutic intervention in order to improve the following deficits and impairments:  Hypomobility, Decreased strength, Pain, Decreased range of motion, Impaired UE functional use, Postural dysfunction  Visit Diagnosis: Cervicalgia  Decreased right shoulder range of motion  Pain in  right arm     Problem List Patient Active Problem List   Diagnosis Date Noted   Grade III hemorrhoids 04/11/2019   Lumbar disc disease 03/11/2019   Grade II hemorrhoids 03/05/2019   Vaginal discharge 06/15/2016   Lateral meniscus tear, current    Hemorrhoids 07/29/2014   Rectal bleeding 03/24/2014   Constipation 03/24/2014   Hypertension 03/14/2011   GERD (gastroesophageal reflux disease) 03/14/2011   SHOULDER PAIN 08/10/2009   IMPINGEMENT SYNDROME 08/10/2009   RUPTURE ROTATOR CUFF 08/10/2009   GERD 07/28/2009   HEMATOCHEZIA 07/28/2009   WEIGHT LOSS, ABNORMAL 07/28/2009   DYSPHAGIA UNSPECIFIED 07/28/2009   CHANGE IN BOWELS 07/28/2009   EPIGASTRIC PAIN 07/28/2009   LOW BACK PAIN 06/02/2008   Lurena Nida, PTA/CLT [phone]  Lurena Nida 01/04/2021, 4:56 PM  [address] [place] [facility] [address] [place], [place], [address] Phone: [phone]   Fax:  [phone]  Name: Tammy Kramer MRN: [id number] Date of Birth: April 28, 1951

## 2021-01-06 ENCOUNTER — Ambulatory Visit (HOSPITAL_COMMUNITY): Payer: Medicare Other | Admitting: Physical Therapy

## 2021-01-06 ENCOUNTER — Other Ambulatory Visit: Payer: Self-pay

## 2021-01-06 DIAGNOSIS — M542 Cervicalgia: Secondary | ICD-10-CM

## 2021-01-06 DIAGNOSIS — M25611 Stiffness of right shoulder, not elsewhere classified: Secondary | ICD-10-CM | POA: Diagnosis not present

## 2021-01-06 DIAGNOSIS — M79601 Pain in right arm: Secondary | ICD-10-CM

## 2021-01-06 NOTE — Therapy (Signed)
[facility] [address] [place], [place], [address] Phone: [phone]   Fax:  [phone]  Physical Therapy Treatment  Patient Details  Name: Tammy Kramer MRN: [id number] Date of Birth: 30-Nov-1950 Referring Provider (PT): Fuller Canada   Encounter Date: 01/06/2021    Past Medical History:  Diagnosis Date   Arthritis    Back pain    Chronic constipation    Complication of anesthesia    difficulty waking up from anesthesia   GERD (gastroesophageal reflux disease)    Hemorrhoids    Hypercholesterolemia    Hypertension    Seasonal allergies     Past Surgical History:  Procedure Laterality Date   ABDOMINAL HYSTERECTOMY     BACK SURGERY     C4-C5 fusion     CATARACT EXTRACTION W/PHACO Right 05/02/2017   Procedure: CATARACT EXTRACTION PHACO AND INTRAOCULAR LENS PLACEMENT (IOC);  Surgeon: Jethro Bolus, MD;  Location: [facility];  Service: Ophthalmology;  Laterality: Right;  CDE: 5.99   CATARACT EXTRACTION W/PHACO Left 05/16/2017   Procedure: CATARACT EXTRACTION PHACO AND INTRAOCULAR LENS PLACEMENT (IOC);  Surgeon: Jethro Bolus, MD;  Location: [facility];  Service: Ophthalmology;  Laterality: Left;  CDE: 3.80   cholecystectomy     CHOLECYSTECTOMY     COLONOSCOPY   10/16/2006   NUR: 3 mm polyp ablated via cold biopsy from the splenic flexure.External hemorrhoids, hyperplastic polyp   COLONOSCOPY  Feb 2011   Dr. Jena Gauss: internal hemorrhoids, otherwise normal.    COLONOSCOPY N/A 04/08/2014   RMR: Internal hemorrhoids; colonic diverticulosis( few  as described above) hematochezia likely secondary to hemorrhoids in the setting of constipation.   COLONOSCOPY N/A 12/08/2016   Dr. Jena Gauss: none bleeding internal hemorrhoids which were small. Next colonoscopy in five years.   DILATION AND CURETTAGE OF UTERUS     multiple   ESOPHAGOGASTRODUODENOSCOPY  06/14/2006   NUR: Normal esophagogastroduodenoscopy   ESOPHAGOGASTRODUODENOSCOPY  Feb 2011    Dr. Jena Gauss: inflamed-appearing esophagus with overlying distal esophageal erosions superimposed on noncritical Schatzki ring, s/p dilation and biopsy. Benign path   ESOPHAGOGASTRODUODENOSCOPY N/A 12/08/2016   Dr. Jena Gauss: LA grade A esophagitis, mild schatzki's ring at the GE junction, small hiatal hernia, esophageal dilation performed. Prevacid increased to BID.   foot surgery     X 5-bone spurs and tendon release; morton's neuroma.   HAND SURGERY Right    X 2-carpal tunnel release and ganglion cyst removed.   HEMORRHOID BANDING  2016   Dr.Rourk   KNEE ARTHROSCOPY WITH LATERAL MENISECTOMY Left 03/27/2015   Procedure: KNEE ARTHROSCOPY WITH LATERAL MENISECTOMY;  Surgeon: Vickki Hearing, MD;  Location: [facility];  Service: Orthopedics;  Laterality: Left;   ROTATOR CUFF REPAIR Right     There were no vitals filed for this visit.   Subjective Assessment - 01/06/21 1630     Subjective Pt reports vast improvement since last visit.  Reports she's been doing her exercises.  Pt very pleased with progress thus far.  No radiating pain in the past couple days either.    Currently in Pain? Yes    Pain Score 4     Pain Location Neck    Pain Orientation Right    Pain Descriptors / Indicators Aching;Radiating                               [facility] Adult PT Treatment/Exercise - 01/06/21 0001       Neck  Exercises: Theraband   Scapula Retraction 10 reps;Red    Shoulder Extension 10 reps;Red    Rows 10 reps;Red      Neck Exercises: Seated   Neck Retraction 10 reps    W Back 10 reps    Shoulder Rolls 10 reps    Shoulder Rolls Limitations backward    Shoulder Flexion 10 reps    Shoulder ABduction 10 reps      Manual Therapy   Manual Therapy Soft tissue mobilization    Manual therapy comments completed seperately from all other skilled interventions    Soft tissue mobilization seated to Rt cervical UT, scalenes and paraspinasl                      PT Short Term  Goals - 01/04/21 1603       PT SHORT TERM GOAL #1   Title Patient will demonstrate painfree cervical ROM    Time 3    Period Weeks    Status On-going    Target Date 01/19/21      PT SHORT TERM GOAL #2   Title Patient will report at least 50% improvement in overall symptoms and/or function to demonstrate improved functional mobility    Time 3    Period Weeks    Status On-going    Target Date 01/19/21      PT SHORT TERM GOAL #3   Title Patient will be independent in self management strategies to improve quality of life and functional outcomes.    Time 3    Period Weeks    Status On-going    Target Date 01/19/21               PT Long Term Goals - 01/04/21 1603       PT LONG TERM GOAL #1   Title Patient will report at least 75% improvement in overall symptoms and/or function to demonstrate improved functional mobility    Time 6    Period Weeks    Status On-going      PT LONG TERM GOAL #2   Title Patient will be able to reach overhead with right arm without difficulties    Time 6    Period Weeks    Status On-going      PT LONG TERM GOAL #3   Title Patient will improve on FOTO score to meet predicted outcomes to demonstrate improved functional mobility.    Time 6    Period Weeks    Status On-going      PT LONG TERM GOAL #4   Title Patient will be able to vacuum without difficutlies to improve ability to clean house    Time 6    Period Weeks    Status On-going                   Plan - 01/06/21 1715     Clinical Impression Statement Added postural strengthening with therabands.  Pt required verbal and tactile cues as tends to bend wrists, pull head forward and substitute with Upper traps.   Pt with less tightness in Lt upper trap with overall reduced pain.    Personal Factors and Comorbidities Comorbidity 1;Comorbidity 2    Comorbidities hx of lumbar/neck/shoulder surgery 10+ years ago    Examination-Activity Limitations Lift;Reach  Overhead;Hygiene/Grooming;Dressing;Sleep    Examination-Participation Restrictions Driving;Community Activity;Shop;Cleaning;Meal Prep    Stability/Clinical Decision Making Stable/Uncomplicated    Rehab Potential Good    PT Frequency 2x / week  PT Duration 6 weeks    PT Treatment/Interventions ADLs/Self Care Home Management    PT Next Visit Plan contiue to improve neck and shoulder ROM.  Continue STM and complete neck/thoracic mobilizations as needed.  Progress postural strength and awareness.    PT Home Exercise Plan chin tucks, cervical ROM    Consulted and Agree with Plan of Care Patient             Patient will benefit from skilled therapeutic intervention in order to improve the following deficits and impairments:  Hypomobility, Decreased strength, Pain, Decreased range of motion, Impaired UE functional use, Postural dysfunction  Visit Diagnosis: Cervicalgia  Decreased right shoulder range of motion  Pain in right arm     Problem List Patient Active Problem List   Diagnosis Date Noted   Grade III hemorrhoids 04/11/2019   Lumbar disc disease 03/11/2019   Grade II hemorrhoids 03/05/2019   Vaginal discharge 06/15/2016   Lateral meniscus tear, current    Hemorrhoids 07/29/2014   Rectal bleeding 03/24/2014   Constipation 03/24/2014   Hypertension 03/14/2011   GERD (gastroesophageal reflux disease) 03/14/2011   SHOULDER PAIN 08/10/2009   IMPINGEMENT SYNDROME 08/10/2009   RUPTURE ROTATOR CUFF 08/10/2009   GERD 07/28/2009   HEMATOCHEZIA 07/28/2009   WEIGHT LOSS, ABNORMAL 07/28/2009   DYSPHAGIA UNSPECIFIED 07/28/2009   CHANGE IN BOWELS 07/28/2009   EPIGASTRIC PAIN 07/28/2009   LOW BACK PAIN 06/02/2008   Lurena Nida, PTA/CLT [phone]  Lurena Nida 01/06/2021, 5:16 PM   [facility] [address] [place], [place], [address] Phone: [phone]   Fax:  [phone]  Name: LYDIE STAMMEN MRN: [id number] Date of  Birth: 15-Jun-1951

## 2021-01-12 ENCOUNTER — Ambulatory Visit (HOSPITAL_COMMUNITY): Payer: Medicare Other | Admitting: Physical Therapy

## 2021-01-12 ENCOUNTER — Other Ambulatory Visit: Payer: Self-pay

## 2021-01-12 DIAGNOSIS — M25611 Stiffness of right shoulder, not elsewhere classified: Secondary | ICD-10-CM

## 2021-01-12 DIAGNOSIS — M79601 Pain in right arm: Secondary | ICD-10-CM

## 2021-01-12 DIAGNOSIS — M542 Cervicalgia: Secondary | ICD-10-CM

## 2021-01-12 NOTE — Therapy (Signed)
[facility] [address] [place], [place], [address] Phone: [phone]   Fax:  [phone]  Physical Therapy Treatment  Patient Details  Name: Tammy Kramer MRN: [id number] Date of Birth: 06-06-1951 Referring Provider (PT): Fuller Canada   Encounter Date: 01/12/2021   PT End of Session - 01/12/21 1558     Visit Number 3    Number of Visits 12    Date for PT Re-Evaluation 02/09/21    Authorization Type [employer] Medicare no auth    Progress Note Due on Visit 10    PT Start Time 1535    PT Stop Time 1615    PT Time Calculation (min) 40 min    Activity Tolerance Patient limited by pain    Behavior During Therapy [facility] for tasks assessed/performed             Past Medical History:  Diagnosis Date   Arthritis    Back pain    Chronic constipation    Complication of anesthesia    difficulty waking up from anesthesia   GERD (gastroesophageal reflux disease)    Hemorrhoids    Hypercholesterolemia    Hypertension    Seasonal allergies     Past Surgical History:  Procedure Laterality Date   ABDOMINAL HYSTERECTOMY     BACK SURGERY     C4-C5 fusion     CATARACT EXTRACTION W/PHACO Right 05/02/2017   Procedure: CATARACT EXTRACTION PHACO AND INTRAOCULAR LENS PLACEMENT (IOC);  Surgeon: Jethro Bolus, MD;  Location: [facility];  Service: Ophthalmology;  Laterality: Right;  CDE: 5.99   CATARACT EXTRACTION W/PHACO Left 05/16/2017   Procedure: CATARACT EXTRACTION PHACO AND INTRAOCULAR LENS PLACEMENT (IOC);  Surgeon: Jethro Bolus, MD;  Location: [facility];  Service: Ophthalmology;  Laterality: Left;  CDE: 3.80   cholecystectomy     CHOLECYSTECTOMY     COLONOSCOPY   10/16/2006   NUR: 3 mm polyp ablated via cold biopsy from the splenic flexure.External hemorrhoids, hyperplastic polyp   COLONOSCOPY  Feb 2011   Dr. Jena Gauss: internal hemorrhoids, otherwise normal.    COLONOSCOPY N/A 04/08/2014   RMR: Internal hemorrhoids; colonic diverticulosis( few   as described above) hematochezia likely secondary to hemorrhoids in the setting of constipation.   COLONOSCOPY N/A 12/08/2016   Dr. Jena Gauss: none bleeding internal hemorrhoids which were small. Next colonoscopy in five years.   DILATION AND CURETTAGE OF UTERUS     multiple   ESOPHAGOGASTRODUODENOSCOPY  06/14/2006   NUR: Normal esophagogastroduodenoscopy   ESOPHAGOGASTRODUODENOSCOPY  Feb 2011   Dr. Jena Gauss: inflamed-appearing esophagus with overlying distal esophageal erosions superimposed on noncritical Schatzki ring, s/p dilation and biopsy. Benign path   ESOPHAGOGASTRODUODENOSCOPY N/A 12/08/2016   Dr. Jena Gauss: LA grade A esophagitis, mild schatzki's ring at the GE junction, small hiatal hernia, esophageal dilation performed. Prevacid increased to BID.   foot surgery     X 5-bone spurs and tendon release; morton's neuroma.   HAND SURGERY Right    X 2-carpal tunnel release and ganglion cyst removed.   HEMORRHOID BANDING  2016   Dr.Rourk   KNEE ARTHROSCOPY WITH LATERAL MENISECTOMY Left 03/27/2015   Procedure: KNEE ARTHROSCOPY WITH LATERAL MENISECTOMY;  Surgeon: Vickki Hearing, MD;  Location: [facility];  Service: Orthopedics;  Laterality: Left;   ROTATOR CUFF REPAIR Right     There were no vitals filed for this visit.   Subjective Assessment - 01/12/21 1551     Subjective pt states she was able to push mow her yard  yesterday without any pain or issues.  STates it took approx 1.5 hours to complete.  no pain today.    Currently in Pain? No/denies                               [facility] Adult PT Treatment/Exercise - 01/12/21 0001       Neck Exercises: Machines for Strengthening   UBE (Upper Arm Bike) 3 minutes backward L1      Neck Exercises: Theraband   Scapula Retraction 15 reps;Green    Shoulder Extension 15 reps;Green    Rows 15 reps;Green      Neck Exercises: Seated   Neck Retraction 15 reps    W Back 15 reps    Shoulder Rolls 15 reps    Shoulder Rolls  Limitations backward    Shoulder Flexion 15 reps    Shoulder ABduction 15 reps      Manual Therapy   Manual Therapy Soft tissue mobilization    Manual therapy comments completed seperately from all other skilled interventions    Soft tissue mobilization seated to Rt cervical UT, scalenes and paraspinasl                      PT Short Term Goals - 01/04/21 1603       PT SHORT TERM GOAL #1   Title Patient will demonstrate painfree cervical ROM    Time 3    Period Weeks    Status On-going    Target Date 01/19/21      PT SHORT TERM GOAL #2   Title Patient will report at least 50% improvement in overall symptoms and/or function to demonstrate improved functional mobility    Time 3    Period Weeks    Status On-going    Target Date 01/19/21      PT SHORT TERM GOAL #3   Title Patient will be independent in self management strategies to improve quality of life and functional outcomes.    Time 3    Period Weeks    Status On-going    Target Date 01/19/21               PT Long Term Goals - 01/04/21 1603       PT LONG TERM GOAL #1   Title Patient will report at least 75% improvement in overall symptoms and/or function to demonstrate improved functional mobility    Time 6    Period Weeks    Status On-going      PT LONG TERM GOAL #2   Title Patient will be able to reach overhead with right arm without difficulties    Time 6    Period Weeks    Status On-going      PT LONG TERM GOAL #3   Title Patient will improve on FOTO score to meet predicted outcomes to demonstrate improved functional mobility.    Time 6    Period Weeks    Status On-going      PT LONG TERM GOAL #4   Title Patient will be able to vacuum without difficutlies to improve ability to clean house    Time 6    Period Weeks    Status On-going                   Plan - 01/12/21 1628     Clinical Impression Statement Continued with LE strengthening exercises.  Continues  to require  both verbal and tactile cues to complete theraband exercises in correct form as tends to bend wrists, keep arms close to sides and substitute with upper trap musculature.   Pt is resuming activities and able to complete without residual pain or symptoms into UE's.  Pt is overall making excellent progress.    Personal Factors and Comorbidities Comorbidity 1;Comorbidity 2    Comorbidities hx of lumbar/neck/shoulder surgery 10+ years ago    Examination-Activity Limitations Lift;Reach Overhead;Hygiene/Grooming;Dressing;Sleep    Examination-Participation Restrictions Driving;Community Activity;Shop;Cleaning;Meal Prep    Stability/Clinical Decision Making Stable/Uncomplicated    Rehab Potential Good    PT Frequency 2x / week    PT Duration 6 weeks    PT Treatment/Interventions ADLs/Self Care Home Management    PT Next Visit Plan contiue to improve neck and shoulder ROM.  Continue STM and complete neck/thoracic mobilizations as needed.  Progress postural strength and awareness.  next session work on Therapist, nutritional.    PT Home Exercise Plan chin tucks, cervical ROM    Consulted and Agree with Plan of Care Patient             Patient will benefit from skilled therapeutic intervention in order to improve the following deficits and impairments:  Hypomobility, Decreased strength, Pain, Decreased range of motion, Impaired UE functional use, Postural dysfunction  Visit Diagnosis: Cervicalgia  Decreased right shoulder range of motion  Pain in right arm     Problem List Patient Active Problem List   Diagnosis Date Noted   Grade III hemorrhoids 04/11/2019   Lumbar disc disease 03/11/2019   Grade II hemorrhoids 03/05/2019   Vaginal discharge 06/15/2016   Lateral meniscus tear, current    Hemorrhoids 07/29/2014   Rectal bleeding 03/24/2014   Constipation 03/24/2014   Hypertension 03/14/2011   GERD (gastroesophageal reflux disease) 03/14/2011   SHOULDER PAIN  08/10/2009   IMPINGEMENT SYNDROME 08/10/2009   RUPTURE ROTATOR CUFF 08/10/2009   GERD 07/28/2009   HEMATOCHEZIA 07/28/2009   WEIGHT LOSS, ABNORMAL 07/28/2009   DYSPHAGIA UNSPECIFIED 07/28/2009   CHANGE IN BOWELS 07/28/2009   EPIGASTRIC PAIN 07/28/2009   LOW BACK PAIN 06/02/2008   Lurena Nida, PTA/CLT [phone]  Lurena Nida 01/12/2021, 4:30 PM  [facility] [address] [place], [place], [address] Phone: [phone]   Fax:  [phone]  Name: Tammy Kramer MRN: [id number] Date of Birth: 1950/09/06

## 2021-01-15 ENCOUNTER — Ambulatory Visit (HOSPITAL_COMMUNITY): Payer: Medicare Other | Admitting: Physical Therapy

## 2021-01-15 ENCOUNTER — Other Ambulatory Visit: Payer: Self-pay

## 2021-01-15 ENCOUNTER — Encounter (HOSPITAL_COMMUNITY): Payer: Self-pay | Admitting: Physical Therapy

## 2021-01-15 DIAGNOSIS — M25611 Stiffness of right shoulder, not elsewhere classified: Secondary | ICD-10-CM | POA: Diagnosis not present

## 2021-01-15 DIAGNOSIS — M79601 Pain in right arm: Secondary | ICD-10-CM | POA: Diagnosis not present

## 2021-01-15 DIAGNOSIS — M542 Cervicalgia: Secondary | ICD-10-CM

## 2021-01-15 NOTE — Therapy (Signed)
[facility] [address] [place], [place], [address] Phone: [phone]   Fax:  [phone]  Physical Therapy Treatment and Discharge Note  Patient Details  Name: Tammy Kramer MRN: [id number] Date of Birth: 14-Nov-1950 Referring Provider (PT): Arther Abbott  PHYSICAL THERAPY DISCHARGE SUMMARY  Visits from Start of Care: 4  Current functional level related to goals / functional outcomes: See below  Remaining deficits: See below   Education / Equipment: See below   Patient agrees to discharge. Patient goals were met. Patient is being discharged due to meeting the stated rehab goals.    Encounter Date: 01/15/2021   PT End of Session - 01/15/21 1448     Visit Number 4    Number of Visits 12    Date for PT Re-Evaluation 02/09/21    Authorization Type [employer] Medicare no auth    Progress Note Due on Visit 10    PT Start Time 1448    PT Stop Time 1526    PT Time Calculation (min) 38 min    Activity Tolerance Patient limited by pain    Behavior During Therapy WFL for tasks assessed/performed             Past Medical History:  Diagnosis Date   Arthritis    Back pain    Chronic constipation    Complication of anesthesia    difficulty waking up from anesthesia   GERD (gastroesophageal reflux disease)    Hemorrhoids    Hypercholesterolemia    Hypertension    Seasonal allergies     Past Surgical History:  Procedure Laterality Date   ABDOMINAL HYSTERECTOMY     BACK SURGERY     C4-C5 fusion     CATARACT EXTRACTION W/PHACO Right 05/02/2017   Procedure: CATARACT EXTRACTION PHACO AND INTRAOCULAR LENS PLACEMENT ([place]);  Surgeon: Rutherford Guys, MD;  Location: [facility];  Service: Ophthalmology;  Laterality: Right;  CDE: 5.99   CATARACT EXTRACTION W/PHACO Left 05/16/2017   Procedure: CATARACT EXTRACTION PHACO AND INTRAOCULAR LENS PLACEMENT ([facility]);  Surgeon: Rutherford Guys, MD;  Location: [facility];  Service: Ophthalmology;   Laterality: Left;  CDE: 3.80   cholecystectomy     CHOLECYSTECTOMY     COLONOSCOPY   10/16/2006   NUR: 3 mm polyp ablated via cold biopsy from the splenic flexure.External hemorrhoids, hyperplastic polyp   COLONOSCOPY  Feb 2011   Dr. Gala Romney: internal hemorrhoids, otherwise normal.    COLONOSCOPY N/A 04/08/2014   RMR: Internal hemorrhoids; colonic diverticulosis( few  as described above) hematochezia likely secondary to hemorrhoids in the setting of constipation.   COLONOSCOPY N/A 12/08/2016   Dr. Gala Romney: none bleeding internal hemorrhoids which were small. Next colonoscopy in five years.   DILATION AND CURETTAGE OF UTERUS     multiple   ESOPHAGOGASTRODUODENOSCOPY  06/14/2006   NUR: Normal esophagogastroduodenoscopy   ESOPHAGOGASTRODUODENOSCOPY  Feb 2011   Dr. Gala Romney: inflamed-appearing esophagus with overlying distal esophageal erosions superimposed on noncritical Schatzki ring, s/p dilation and biopsy. Benign path   ESOPHAGOGASTRODUODENOSCOPY N/A 12/08/2016   Dr. Gala Romney: LA grade A esophagitis, mild schatzki's ring at the GE junction, small hiatal hernia, esophageal dilation performed. Prevacid increased to BID.   foot surgery     X 5-bone spurs and tendon release; morton's neuroma.   HAND SURGERY Right    X 2-carpal tunnel release and ganglion cyst removed.   HEMORRHOID BANDING  2016   Dr.Rourk   KNEE ARTHROSCOPY WITH LATERAL MENISECTOMY Left 03/27/2015   Procedure:  KNEE ARTHROSCOPY WITH LATERAL MENISECTOMY;  Surgeon: Carole Civil, MD;  Location: [facility];  Service: Orthopedics;  Laterality: Left;   ROTATOR CUFF REPAIR Right     There were no vitals filed for this visit.   Subjective Assessment - 01/15/21 1455     Subjective Patient reports 99% better since the start of PT. Performing exercises multiple times a day with reported relief. Able to mow her grass and sweeping. States she is going to try vacuuming this weekend.    Currently in Pain? No/denies                [facility]  PT Assessment - 01/15/21 0001       Assessment   Medical Diagnosis neck and right arm pain    Referring Provider (PT) Arther Abbott      Observation/Other Assessments   Focus on Therapeutic Outcomes (FOTO)  67% function   predicted of 54% function     AROM   Right Shoulder Flexion 155 Degrees   no pain   Right Shoulder ABduction --   [facility] no pain   Left Shoulder Flexion 160 Degrees    Left Shoulder ABduction --   WNL   Cervical Flexion [facility]    Cervical Extension 45   no pain   Cervical - Right Side Bend 28   no pain   Cervical - Left Side Bend 25   no pain   Cervical - Right Rotation 65   no pain   Cervical - Left Rotation 58   no pain                          [facility] Adult PT Treatment/Exercise - 01/15/21 0001       Neck Exercises: Theraband   Scapula Retraction 15 reps;Green    Shoulder Extension 15 reps;Green    Rows 15 reps;Green      Neck Exercises: Seated   Neck Retraction 15 reps    Shoulder Rolls 15 reps    Shoulder Rolls Limitations backward    Shoulder Flexion 15 reps                    PT Education - 01/15/21 1530     Education Details on HEP, FOTO score on POC, on current presentation    Person(s) Educated Patient    Methods Explanation    Comprehension Verbalized understanding              PT Short Term Goals - 01/15/21 1455       PT SHORT TERM GOAL #1   Title Patient will demonstrate painfree cervical ROM    Time 3    Period Weeks    Status Achieved    Target Date 01/19/21      PT SHORT TERM GOAL #2   Title Patient will report at least 50% improvement in overall symptoms and/or function to demonstrate improved functional mobility    Time 3    Period Weeks    Status Achieved    Target Date 01/19/21      PT SHORT TERM GOAL #3   Title Patient will be independent in self management strategies to improve quality of life and functional outcomes.    Time 3    Period Weeks    Status Achieved    Target Date  01/19/21               PT Long Term Goals - 01/15/21  [place] #1   Title Patient will report at least 75% improvement in overall symptoms and/or function to demonstrate improved functional mobility    Time 6    Period Weeks    Status Achieved      PT LONG TERM GOAL #2   Title Patient will be able to reach overhead with right arm without difficulties    Time 6    Period Weeks    Status Achieved      PT LONG TERM GOAL #3   Title Patient will improve on FOTO score to meet predicted outcomes to demonstrate improved functional mobility.    Time 6    Period Weeks    Status Achieved      PT LONG TERM GOAL #4   Title Patient will be able to vacuum without difficutlies to improve ability to clean house    Baseline has not tried yet    Time 6    Period Weeks    Status On-going                   Plan - 01/15/21 1532     Clinical Impression Statement All short term goals met at this time.  long term goals met. The only goal not met is her vacuuming goal as patient has not tried to vacuum yet but she was able to mow her yard without difficulty. Patient independent in home program, reviewed exercises and provided with print offs of exercises she did not receive print offs for. Provided patient with theraband for home adherence. Answered all questions and patient to discharge from PT to HEP at this time.    Personal Factors and Comorbidities Comorbidity 1;Comorbidity 2    Comorbidities hx of lumbar/neck/shoulder surgery 10+ years ago    Examination-Activity Limitations Lift;Reach Overhead;Hygiene/Grooming;Dressing;Sleep    Examination-Participation Restrictions Driving;Community Activity;Shop;Cleaning;Meal Prep    Stability/Clinical Decision Making Stable/Uncomplicated    Rehab Potential Good    PT Frequency 2x / week    PT Duration 6 weeks    PT Treatment/Interventions ADLs/Self Care Home Management    PT Next Visit Plan DC to HEP    PT Home  Exercise Plan chin tucks, cervical ROM    Consulted and Agree with Plan of Care Patient             Patient will benefit from skilled therapeutic intervention in order to improve the following deficits and impairments:  Hypomobility, Decreased strength, Pain, Decreased range of motion, Impaired UE functional use, Postural dysfunction  Visit Diagnosis: Cervicalgia  Decreased right shoulder range of motion  Pain in right arm     Problem List Patient Active Problem List   Diagnosis Date Noted   Grade III hemorrhoids 04/11/2019   Lumbar disc disease 03/11/2019   Grade II hemorrhoids 03/05/2019   Vaginal discharge 06/15/2016   Lateral meniscus tear, current    Hemorrhoids 07/29/2014   Rectal bleeding 03/24/2014   Constipation 03/24/2014   Hypertension 03/14/2011   GERD (gastroesophageal reflux disease) 03/14/2011   SHOULDER PAIN 08/10/2009   IMPINGEMENT SYNDROME 08/10/2009   RUPTURE ROTATOR CUFF 08/10/2009   GERD 07/28/2009   HEMATOCHEZIA 07/28/2009   WEIGHT LOSS, ABNORMAL 07/28/2009   DYSPHAGIA UNSPECIFIED 07/28/2009   CHANGE IN BOWELS 07/28/2009   EPIGASTRIC PAIN 07/28/2009   LOW BACK PAIN 06/02/2008    3:36 PM, 01/15/21 Jerene Pitch, DPT Physical Therapy with [facility]  [phone] office   [place]  [facility] [address] [place], [place], [address] Phone: [phone]   Fax:  [phone]  Name: Tammy Kramer MRN: [id number] Date of Birth: 24-Sep-1950

## 2021-01-19 ENCOUNTER — Ambulatory Visit (HOSPITAL_COMMUNITY): Payer: Medicare Other | Admitting: Physical Therapy

## 2021-01-20 DIAGNOSIS — H04123 Dry eye syndrome of bilateral lacrimal glands: Secondary | ICD-10-CM | POA: Diagnosis not present

## 2021-01-21 ENCOUNTER — Ambulatory Visit (HOSPITAL_COMMUNITY): Payer: Medicare Other

## 2021-01-22 ENCOUNTER — Other Ambulatory Visit (HOSPITAL_COMMUNITY): Payer: Self-pay | Admitting: Internal Medicine

## 2021-01-22 DIAGNOSIS — Z1231 Encounter for screening mammogram for malignant neoplasm of breast: Secondary | ICD-10-CM

## 2021-01-25 ENCOUNTER — Ambulatory Visit (HOSPITAL_COMMUNITY): Payer: Medicare Other | Admitting: Physical Therapy

## 2021-01-26 ENCOUNTER — Encounter: Payer: Self-pay | Admitting: Gastroenterology

## 2021-01-26 ENCOUNTER — Ambulatory Visit: Payer: Medicare Other | Admitting: Gastroenterology

## 2021-01-26 ENCOUNTER — Other Ambulatory Visit: Payer: Self-pay

## 2021-01-26 VITALS — BP 143/73 | HR 107 | Temp 97.5°F | Ht 65.0 in | Wt 187.2 lb

## 2021-01-26 DIAGNOSIS — K5900 Constipation, unspecified: Secondary | ICD-10-CM

## 2021-01-26 NOTE — Patient Instructions (Addendum)
Let's take Amitiza daily to twice a day. Continue fiber and drink plenty of water!  Let me know if this is helpful!  Next colonoscopy in 2023!   I enjoyed seeing you again today! As you know, I value our relationship and want to provide genuine, compassionate, and quality care. I welcome your feedback. If you receive a survey regarding your visit,  I greatly appreciate you taking time to fill this out. See you next time!  Gelene Mink, PhD, ANP-BC [facility]

## 2021-01-26 NOTE — Progress Notes (Signed)
Referring Provider: Avon Gully, MD Primary Care Physician:  Avon Gully, MD Primary GI: Dr. Jena Gauss   Chief Complaint  Patient presents with   Constipation    Sometimes feels full. Trying to have bm everyday. Has to take stool softener and Metamucil. Feels like hemorrhoid is blocking stool from coming out   Gastroesophageal Reflux    HPI:   Tammy Kramer is a 70 y.o. female presenting today with a history of GERD, constipation, Grade 3 hemorrhoids s/p banding 2020. Colonoscopy due in 2023.    Historically has taken Amitiza and Metamucil. Feels off schedule. Feels like something is blocking her rectum. Not bleeding. Stool is soft. No blood. Has been off routine. Has Linzess at home, too. Amitiza tends to take awhile to work. Not taking Amitiza daily.    Past Medical History:  Diagnosis Date   Arthritis    Back pain    Chronic constipation    Complication of anesthesia    difficulty waking up from anesthesia   GERD (gastroesophageal reflux disease)    Hemorrhoids    Hypercholesterolemia    Hypertension    Seasonal allergies     Past Surgical History:  Procedure Laterality Date   ABDOMINAL HYSTERECTOMY     BACK SURGERY     C4-C5 fusion     CATARACT EXTRACTION W/PHACO Right 05/02/2017   Procedure: CATARACT EXTRACTION PHACO AND INTRAOCULAR LENS PLACEMENT ([facility]);  Surgeon: Jethro Bolus, MD;  Location: [facility];  Service: Ophthalmology;  Laterality: Right;  CDE: 5.99   CATARACT EXTRACTION W/PHACO Left 05/16/2017   Procedure: CATARACT EXTRACTION PHACO AND INTRAOCULAR LENS PLACEMENT ([facility]);  Surgeon: Jethro Bolus, MD;  Location: [facility];  Service: Ophthalmology;  Laterality: Left;  CDE: 3.80   cholecystectomy     CHOLECYSTECTOMY     COLONOSCOPY   10/16/2006   NUR: 3 mm polyp ablated via cold biopsy from the splenic flexure.External hemorrhoids, hyperplastic polyp   COLONOSCOPY  Feb 2011   Dr. Jena Gauss: internal hemorrhoids, otherwise normal.    COLONOSCOPY N/A  04/08/2014   RMR: Internal hemorrhoids; colonic diverticulosis( few  as described above) hematochezia likely secondary to hemorrhoids in the setting of constipation.   COLONOSCOPY N/A 12/08/2016   Dr. Jena Gauss: none bleeding internal hemorrhoids which were small. Next colonoscopy in five years.   DILATION AND CURETTAGE OF UTERUS     multiple   ESOPHAGOGASTRODUODENOSCOPY  06/14/2006   NUR: Normal esophagogastroduodenoscopy   ESOPHAGOGASTRODUODENOSCOPY  Feb 2011   Dr. Jena Gauss: inflamed-appearing esophagus with overlying distal esophageal erosions superimposed on noncritical Schatzki ring, s/p dilation and biopsy. Benign path   ESOPHAGOGASTRODUODENOSCOPY N/A 12/08/2016   Dr. Jena Gauss: LA grade A esophagitis, mild schatzki's ring at the GE junction, small hiatal hernia, esophageal dilation performed. Prevacid increased to BID.   foot surgery     X 5-bone spurs and tendon release; morton's neuroma.   HAND SURGERY Right    X 2-carpal tunnel release and ganglion cyst removed.   HEMORRHOID BANDING  2016   Dr.Rourk   KNEE ARTHROSCOPY WITH LATERAL MENISECTOMY Left 03/27/2015   Procedure: KNEE ARTHROSCOPY WITH LATERAL MENISECTOMY;  Surgeon: Vickki Hearing, MD;  Location: [facility];  Service: Orthopedics;  Laterality: Left;   ROTATOR CUFF REPAIR Right     Current Outpatient Medications  Medication Sig Dispense Refill   atorvastatin (LIPITOR) 40 MG tablet Take 40 mg by mouth daily.      calcium carbonate (TUMS - DOSED IN MG ELEMENTAL CALCIUM) 500 MG chewable  tablet Chew 1 tablet by mouth as needed.      colchicine 0.6 MG tablet Take 1.2mg  (2 tablets) then 0.6mg  (1 tablet) 1 hour after. Then, take 1 tablet every day for 7 days. 10 tablet 2   diclofenac (VOLTAREN) 75 MG EC tablet Take 1 tablet (75 mg total) by mouth at bedtime. (Patient taking differently: Take 75 mg by mouth as needed.) 30 tablet 2   docusate sodium (COLACE) 100 MG capsule Take 100 mg by mouth daily.     fluticasone (FLONASE) 50 MCG/ACT nasal  spray Place 1 spray into both nostrils daily.   3   gabapentin (NEURONTIN) 100 MG capsule Take 1 capsule (100 mg total) by mouth 3 (three) times daily. (Patient taking differently: Take 100 mg by mouth as needed.) 60 capsule 2   hydrocortisone (PROCTOZONE-HC) 2.5 % rectal cream Place 1 application rectally 2 (two) times daily. (Patient taking differently: Place 1 application rectally as needed.) 30 g 3   ibuprofen (ADVIL,MOTRIN) 200 MG tablet Take 200 mg by mouth every 8 (eight) hours as needed (for pain).      lansoprazole (PREVACID) 15 MG capsule Take 15 mg by mouth daily.     loratadine (CLARITIN) 10 MG tablet Take 10 mg by mouth daily.     lubiprostone (AMITIZA) 24 MCG capsule Take 24 mcg by mouth as needed.     METAMUCIL FIBER PO Take by mouth every other day. 2 TBSP every other day     Naphazoline HCl (CLEAR EYES OP) Place 1 drop 2 (two) times daily as needed into both eyes (dry eyes).      Polyethylene Glycol 3350 (CLEARLAX PO) Take by mouth as needed.     sucralfate (CARAFATE) 1 g tablet Take 1 tablet (1 g total) by mouth 4 (four) times daily -  with meals and at bedtime. (Patient taking differently: Take 1 g by mouth 4 (four) times daily -  with meals and at bedtime. As needed) 21 tablet 0   valsartan-hydrochlorothiazide (DIOVAN-HCT) 160-25 MG tablet Take 1 tablet by mouth daily.     hydrocortisone-pramoxine (PROCTOFOAM-HC) rectal foam Place 1 applicator rectally 2 (two) times daily. As needed (Patient not taking: Reported on 01/26/2021)     lansoprazole (PREVACID) 30 MG capsule Take 30 mg by mouth. As needed (Patient not taking: Reported on 01/26/2021)     neomycin-bacitracin-polymyxin (NEOSPORIN) ophthalmic ointment SMARTSIG:In Eye(s) Every 3-4 Hours PRN (Patient not taking: Reported on 01/26/2021)     predniSONE (STERAPRED UNI-PAK 48 TAB) 10 MG (48) TBPK tablet Take by mouth daily. (Patient not taking: Reported on 01/26/2021) 48 tablet 0   No current facility-administered medications for this  visit.    Allergies as of 01/26/2021 - Review Complete 01/26/2021  Allergen Reaction Noted   Codeine Other (See Comments)    Meperidine hcl Other (See Comments)     Family History  Problem Relation Age of Onset   Hypertension Mother    Aneurysm Mother    Diabetes Mother    Heart disease Father    Heart disease Son    Colon cancer Brother        age 24, deceased 2 weeks later    Social History   Socioeconomic History   Marital status: Divorced    Spouse name: Not on file   Number of children: Not on file   Years of education: Not on file   Highest education level: Not on file  Occupational History   Not on file  Tobacco Use  Smoking status: Never   Smokeless tobacco: Never   Tobacco comments:    Never smoked  Vaping Use   Vaping Use: Never used  Substance and Sexual Activity   Alcohol use: No   Drug use: No   Sexual activity: Yes    Birth control/protection: Surgical    Comment: hyst  Other Topics Concern   Not on file  Social History Narrative   Not on file   Social Determinants of Health   Financial Resource Strain: Not on file  Food Insecurity: Not on file  Transportation Needs: Not on file  Physical Activity: Not on file  Stress: Not on file  Social Connections: Not on file    Review of Systems: Gen: Denies fever, chills, anorexia. Denies fatigue, weakness, weight loss.  CV: Denies chest pain, palpitations, syncope, peripheral edema, and claudication. Resp: Denies dyspnea at rest, cough, wheezing, coughing up blood, and pleurisy. GI: see HPI Derm: Denies rash, itching, dry skin Psych: Denies depression, anxiety, memory loss, confusion. No homicidal or suicidal ideation.  Heme: Denies bruising, bleeding, and enlarged lymph nodes.  Physical Exam: BP (!) 143/73   Pulse (!) 107   Temp (!) 97.5 F (36.4 C) (Temporal)   Ht 5\' 5"  (1.651 m)   Wt 187 lb 3.2 oz (84.9 kg)   BMI 31.15 kg/m  General:   Alert and oriented. No distress noted. Pleasant  and cooperative.  Head:  Normocephalic and atraumatic. Eyes:  Conjuctiva clear without scleral icterus. Mouth:  mask in place Abdomen:  +BS, soft, non-tender and non-distended. No rebound or guarding. No HSM or masses noted. Rectal: no mass. External hemorrhoids noted, non-thrombosed. Possible left lateral column prominence internally Msk:  Symmetrical without gross deformities. Normal posture. Extremities:  Without edema. Neurologic:  Alert and  oriented x4 Psych:  Alert and cooperative. Normal mood and affect.  ASSESSMENT/PLAN: Tammy Kramer is a 70 y.o. female presenting today with a history of GERD, constipation, Grade 3 hemorrhoids s/p banding 2020. Colonoscopy due in 2023.   GERD remains well-controlled on Prevacid daily.  Constipation not ideally managed as not taking Amitiza daily; currently, patient is taking prn with less than ideal response. Rectal exam negative for mass and likely sensation she feels is possible hemorrhoid related. No rectal bleeding.  I have asked that she take Amitiza routinely every day.to BID as needed. Continue fiber and increased water intake.  We will see her in January 2023 and arrange colonoscopy at that time.   February 2023, PhD, ANP-BC [facility]

## 2021-01-29 ENCOUNTER — Encounter (HOSPITAL_COMMUNITY): Payer: Medicare Other | Admitting: Physical Therapy

## 2021-01-30 DIAGNOSIS — K219 Gastro-esophageal reflux disease without esophagitis: Secondary | ICD-10-CM | POA: Diagnosis not present

## 2021-01-30 DIAGNOSIS — I10 Essential (primary) hypertension: Secondary | ICD-10-CM | POA: Diagnosis not present

## 2021-02-24 ENCOUNTER — Ambulatory Visit (HOSPITAL_COMMUNITY): Payer: Medicare Other

## 2021-02-25 ENCOUNTER — Ambulatory Visit (HOSPITAL_COMMUNITY)
Admission: RE | Admit: 2021-02-25 | Discharge: 2021-02-25 | Disposition: A | Discharge status: Home / Self Care | Payer: Medicare Other | Source: Ambulatory Visit | Attending: Internal Medicine | Admitting: Internal Medicine

## 2021-02-25 ENCOUNTER — Other Ambulatory Visit: Payer: Self-pay

## 2021-02-25 DIAGNOSIS — Z1231 Encounter for screening mammogram for malignant neoplasm of breast: Secondary | ICD-10-CM | POA: Diagnosis not present

## 2021-03-02 DIAGNOSIS — I10 Essential (primary) hypertension: Secondary | ICD-10-CM | POA: Diagnosis not present

## 2021-03-02 DIAGNOSIS — M159 Polyosteoarthritis, unspecified: Secondary | ICD-10-CM | POA: Diagnosis not present

## 2021-04-01 DIAGNOSIS — I10 Essential (primary) hypertension: Secondary | ICD-10-CM | POA: Diagnosis not present

## 2021-04-01 DIAGNOSIS — K219 Gastro-esophageal reflux disease without esophagitis: Secondary | ICD-10-CM | POA: Diagnosis not present

## 2021-04-06 DIAGNOSIS — Z23 Encounter for immunization: Secondary | ICD-10-CM | POA: Diagnosis not present

## 2021-04-08 ENCOUNTER — Ambulatory Visit: Payer: Medicare Other | Admitting: Orthopedic Surgery

## 2021-04-12 ENCOUNTER — Other Ambulatory Visit: Payer: Self-pay

## 2021-04-12 ENCOUNTER — Encounter: Payer: Self-pay | Admitting: Orthopedic Surgery

## 2021-04-12 ENCOUNTER — Ambulatory Visit (INDEPENDENT_AMBULATORY_CARE_PROVIDER_SITE_OTHER): Payer: Medicare Other | Admitting: Orthopedic Surgery

## 2021-04-12 ENCOUNTER — Ambulatory Visit: Payer: Medicare Other

## 2021-04-12 VITALS — BP 136/99 | HR 118 | Ht 65.0 in | Wt 186.1 lb

## 2021-04-12 DIAGNOSIS — M25571 Pain in right ankle and joints of right foot: Secondary | ICD-10-CM

## 2021-04-12 DIAGNOSIS — M7661 Achilles tendinitis, right leg: Secondary | ICD-10-CM

## 2021-04-12 NOTE — Patient Instructions (Signed)
Do 10 of each exercise seen below do not do the  "eccentric heel stretch until you are having no pain
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
## 2021-05-02 DIAGNOSIS — E785 Hyperlipidemia, unspecified: Secondary | ICD-10-CM | POA: Diagnosis not present

## 2021-05-02 DIAGNOSIS — I10 Essential (primary) hypertension: Secondary | ICD-10-CM | POA: Diagnosis not present

## 2021-06-01 DIAGNOSIS — I10 Essential (primary) hypertension: Secondary | ICD-10-CM | POA: Diagnosis not present

## 2021-06-01 DIAGNOSIS — M159 Polyosteoarthritis, unspecified: Secondary | ICD-10-CM | POA: Diagnosis not present

## 2021-06-28 DIAGNOSIS — E785 Hyperlipidemia, unspecified: Secondary | ICD-10-CM | POA: Diagnosis not present

## 2021-06-28 DIAGNOSIS — Z0001 Encounter for general adult medical examination with abnormal findings: Secondary | ICD-10-CM | POA: Diagnosis not present

## 2021-06-28 DIAGNOSIS — M159 Polyosteoarthritis, unspecified: Secondary | ICD-10-CM | POA: Diagnosis not present

## 2021-06-28 DIAGNOSIS — I10 Essential (primary) hypertension: Secondary | ICD-10-CM | POA: Diagnosis not present

## 2021-06-28 DIAGNOSIS — Z1389 Encounter for screening for other disorder: Secondary | ICD-10-CM | POA: Diagnosis not present

## 2021-06-29 ENCOUNTER — Ambulatory Visit: Payer: Medicare Other | Admitting: Gastroenterology

## 2021-06-29 ENCOUNTER — Encounter: Payer: Self-pay | Admitting: Internal Medicine

## 2021-07-29 DIAGNOSIS — M159 Polyosteoarthritis, unspecified: Secondary | ICD-10-CM | POA: Diagnosis not present

## 2021-07-29 DIAGNOSIS — I10 Essential (primary) hypertension: Secondary | ICD-10-CM | POA: Diagnosis not present

## 2021-08-20 ENCOUNTER — Other Ambulatory Visit: Payer: Self-pay | Admitting: Podiatry

## 2021-08-26 DIAGNOSIS — K219 Gastro-esophageal reflux disease without esophagitis: Secondary | ICD-10-CM | POA: Diagnosis not present

## 2021-08-26 DIAGNOSIS — I10 Essential (primary) hypertension: Secondary | ICD-10-CM | POA: Diagnosis not present

## 2021-09-26 DIAGNOSIS — M159 Polyosteoarthritis, unspecified: Secondary | ICD-10-CM | POA: Diagnosis not present

## 2021-09-26 DIAGNOSIS — I10 Essential (primary) hypertension: Secondary | ICD-10-CM | POA: Diagnosis not present

## 2021-10-26 DIAGNOSIS — M159 Polyosteoarthritis, unspecified: Secondary | ICD-10-CM | POA: Diagnosis not present

## 2021-10-26 DIAGNOSIS — I10 Essential (primary) hypertension: Secondary | ICD-10-CM | POA: Diagnosis not present

## 2021-11-03 ENCOUNTER — Ambulatory Visit: Payer: Medicare Other | Admitting: Gastroenterology

## 2021-11-26 DIAGNOSIS — I10 Essential (primary) hypertension: Secondary | ICD-10-CM | POA: Diagnosis not present

## 2021-11-26 DIAGNOSIS — K219 Gastro-esophageal reflux disease without esophagitis: Secondary | ICD-10-CM | POA: Diagnosis not present

## 2021-12-15 ENCOUNTER — Ambulatory Visit (INDEPENDENT_AMBULATORY_CARE_PROVIDER_SITE_OTHER): Payer: Medicare Other | Admitting: Gastroenterology

## 2021-12-15 ENCOUNTER — Telehealth: Payer: Self-pay | Admitting: *Deleted

## 2021-12-15 VITALS — BP 153/85 | HR 90 | Temp 99.3°F | Ht 64.5 in | Wt 183.6 lb

## 2021-12-15 DIAGNOSIS — Z1211 Encounter for screening for malignant neoplasm of colon: Secondary | ICD-10-CM

## 2021-12-15 DIAGNOSIS — Z800 Family history of malignant neoplasm of digestive organs: Secondary | ICD-10-CM | POA: Diagnosis not present

## 2021-12-15 DIAGNOSIS — K5900 Constipation, unspecified: Secondary | ICD-10-CM

## 2021-12-15 MED ORDER — LUBIPROSTONE 8 MCG PO CAPS: 8.0000 ug | ORAL_CAPSULE | Freq: Two times a day (BID) | ORAL | 3 refills | Status: DC

## 2021-12-15 NOTE — Progress Notes (Signed)
Gastroenterology Office Note     Primary Care Physician:  Benetta Spar, MD  Primary Gastroenterologist: Dr. Jena Gauss    Chief Complaint   Chief Complaint  Patient presents with   Follow-up    Patient here today for a follow up visit. She thinks she is in need of a Tcs. She says she has history of IBS C. She takes amitiza 24 mcg once per week, she takes metamucil 1/2 glass once per day. She has linzess prn,but has not used this as of late. She has at least one -two bm per day stools are formed.      History of Present Illness   Tammy Kramer is a 71 y.o. female presenting today in follow-up with a history of GERD, constipation, Grade 3 hemorrhoids s/p banding 2020. Colonoscopy due now as last was in 2018 and has family history of colon cancer in first-degree relative.    1/2 glass Metamucil daily. Amitiza 24 mcg once per week, which is really strong. Sometimes incomplete bowel movements. She has no abdominal pain. No N/V. No dysphagia. GERD controlled with Prevacid once daily.        Past Medical History:  Diagnosis Date   Arthritis    Back pain    Chronic constipation    Complication of anesthesia    difficulty waking up from anesthesia   GERD (gastroesophageal reflux disease)    Hemorrhoids    Hypercholesterolemia    Hypertension    Seasonal allergies     Past Surgical History:  Procedure Laterality Date   ABDOMINAL HYSTERECTOMY     BACK SURGERY     C4-C5 fusion     CATARACT EXTRACTION W/PHACO Right 05/02/2017   Procedure: CATARACT EXTRACTION PHACO AND INTRAOCULAR LENS PLACEMENT (IOC);  Surgeon: Jethro Bolus, MD;  Location: [facility];  Service: Ophthalmology;  Laterality: Right;  CDE: 5.99   CATARACT EXTRACTION W/PHACO Left 05/16/2017   Procedure: CATARACT EXTRACTION PHACO AND INTRAOCULAR LENS PLACEMENT (IOC);  Surgeon: Jethro Bolus, MD;  Location: [facility];  Service: Ophthalmology;  Laterality: Left;  CDE: 3.80   cholecystectomy     CHOLECYSTECTOMY      COLONOSCOPY   10/16/2006   NUR: 3 mm polyp ablated via cold biopsy from the splenic flexure.External hemorrhoids, hyperplastic polyp   COLONOSCOPY  Feb 2011   Dr. Jena Gauss: internal hemorrhoids, otherwise normal.    COLONOSCOPY N/A 04/08/2014   RMR: Internal hemorrhoids; colonic diverticulosis( few  as described above) hematochezia likely secondary to hemorrhoids in the setting of constipation.   COLONOSCOPY N/A 12/08/2016   Dr. Jena Gauss: none bleeding internal hemorrhoids which were small. Next colonoscopy in five years.   DILATION AND CURETTAGE OF UTERUS     multiple   ESOPHAGOGASTRODUODENOSCOPY  06/14/2006   NUR: Normal esophagogastroduodenoscopy   ESOPHAGOGASTRODUODENOSCOPY  Feb 2011   Dr. Jena Gauss: inflamed-appearing esophagus with overlying distal esophageal erosions superimposed on noncritical Schatzki ring, s/p dilation and biopsy. Benign path   ESOPHAGOGASTRODUODENOSCOPY N/A 12/08/2016   Dr. Jena Gauss: LA grade A esophagitis, mild schatzki's ring at the GE junction, small hiatal hernia, esophageal dilation performed. Prevacid increased to BID.   foot surgery     X 5-bone spurs and tendon release; morton's neuroma.   HAND SURGERY Right    X 2-carpal tunnel release and ganglion cyst removed.   HEMORRHOID BANDING  2016   Dr.Rourk   KNEE ARTHROSCOPY WITH LATERAL MENISECTOMY Left 03/27/2015   Procedure: KNEE ARTHROSCOPY WITH LATERAL MENISECTOMY;  Surgeon: Vickki Hearing, MD;  Location: [facility];  Service: Orthopedics;  Laterality: Left;   ROTATOR CUFF REPAIR Right     Current Outpatient Medications  Medication Sig Dispense Refill   atorvastatin (LIPITOR) 40 MG tablet Take 40 mg by mouth daily.      calcium carbonate (TUMS - DOSED IN MG ELEMENTAL CALCIUM) 500 MG chewable tablet Chew 1 tablet by mouth as needed.      colchicine 0.6 MG tablet Take 1.2mg  (2 tablets) then 0.6mg  (1 tablet) 1 hour after. Then, take 1 tablet every day for 7 days. 10 tablet 2   diclofenac (VOLTAREN) 75 MG EC tablet  Take 1 tablet (75 mg total) by mouth at bedtime. (Patient taking differently: Take 75 mg by mouth as needed.) 30 tablet 2   docusate sodium (COLACE) 100 MG capsule Take 100 mg by mouth daily.     fluticasone (FLONASE) 50 MCG/ACT nasal spray Place 1 spray into both nostrils daily.   3   gabapentin (NEURONTIN) 100 MG capsule Take 1 capsule (100 mg total) by mouth 3 (three) times daily. (Patient taking differently: Take 100 mg by mouth as needed.) 60 capsule 2   hydrocortisone (PROCTOZONE-HC) 2.5 % rectal cream Place 1 application rectally 2 (two) times daily. (Patient taking differently: Place 1 application rectally as needed.) 30 g 3   ibuprofen (ADVIL,MOTRIN) 200 MG tablet Take 200 mg by mouth every 8 (eight) hours as needed (for pain).      lansoprazole (PREVACID) 15 MG capsule Take 15 mg by mouth daily.     loratadine (CLARITIN) 10 MG tablet Take 10 mg by mouth daily.     lubiprostone (AMITIZA) 24 MCG capsule Take 24 mcg by mouth as needed.     METAMUCIL FIBER PO Take by mouth every other day. 2 TBSP every other day     Naphazoline HCl (CLEAR EYES OP) Place 1 drop 2 (two) times daily as needed into both eyes (dry eyes).      Polyethylene Glycol 3350 (CLEARLAX PO) Take by mouth as needed.     sucralfate (CARAFATE) 1 g tablet Take 1 tablet (1 g total) by mouth 4 (four) times daily -  with meals and at bedtime. (Patient taking differently: Take 1 g by mouth 4 (four) times daily -  with meals and at bedtime. As needed) 21 tablet 0   valsartan-hydrochlorothiazide (DIOVAN-HCT) 160-25 MG tablet Take 1 tablet by mouth daily.     No current facility-administered medications for this visit.    Allergies as of 12/15/2021 - Review Complete 04/12/2021  Allergen Reaction Noted   Codeine Other (See Comments)    Meperidine hcl Other (See Comments)     Family History  Problem Relation Age of Onset   Hypertension Mother    Aneurysm Mother    Diabetes Mother    Heart disease Father    Heart disease Son     Colon cancer Brother        age 34, deceased 2 weeks later    Social History   Socioeconomic History   Marital status: Divorced    Spouse name: Not on file   Number of children: Not on file   Years of education: Not on file   Highest education level: Not on file  Occupational History   Not on file  Tobacco Use   Smoking status: Never   Smokeless tobacco: Never   Tobacco comments:    Never smoked  Vaping Use   Vaping Use: Never used  Substance and Sexual Activity  Alcohol use: No   Drug use: No   Sexual activity: Yes    Birth control/protection: Surgical    Comment: hyst  Other Topics Concern   Not on file  Social History Narrative   Not on file   Social Determinants of Health   Financial Resource Strain: Not on file  Food Insecurity: Not on file  Transportation Needs: Not on file  Physical Activity: Not on file  Stress: Not on file  Social Connections: Not on file  Intimate Partner Violence: Not on file     Review of Systems   Gen: Denies any fever, chills, fatigue, weight loss, lack of appetite.  CV: Denies chest pain, heart palpitations, peripheral edema, syncope.  Resp: Denies shortness of breath at rest or with exertion. Denies wheezing or cough.  GI: see HPI GU : Denies urinary burning, urinary frequency, urinary hesitancy MS: Denies joint pain, muscle weakness, cramps, or limitation of movement.  Derm: Denies rash, itching, dry skin Psych: Denies depression, anxiety, memory loss, and confusion Heme: Denies bruising, bleeding, and enlarged lymph nodes.   Physical Exam   BP (!) 153/85 (BP Location: Left Arm, Patient Position: Sitting, Cuff Size: Small)   Pulse 90   Temp 99.3 F (37.4 C) (Oral)   Ht 5' 4.5" (1.638 m)   Wt 183 lb 9.6 oz (83.3 kg)   BMI 31.03 kg/m  General:   Alert and oriented. Pleasant and cooperative. Well-nourished and well-developed.  Head:  Normocephalic and atraumatic. Eyes:  Without icterus Abdomen:  +BS, soft,  non-tender and non-distended. No HSM noted. No guarding or rebound. No masses appreciated.  Rectal:  Deferred  Msk:  Symmetrical without gross deformities. Normal posture. Extremities:  Without edema. Neurologic:  Alert and  oriented x4;  grossly normal neurologically. Skin:  Intact without significant lesions or rashes. Psych:  Alert and cooperative. Normal mood and affect.   Assessment   Tammy Kramer is a 71 y.o. female presenting today in follow-up with a history of GERD, constipation, Grade 3 hemorrhoids s/p banding in 2020, with colonoscopy due now.  High risk screening colonoscopy: history of first-degree relative (brother) with colon cancer. Last colonoscopy in 2018. No concerning lower GI signs/symptoms.  Constipation: historically has taken Amitiza 24 mcg just once per week. However, she notes it is quite strong. Continue with fiber. Trial 8 mcg Amitiza and take once to twice daily with food.   GERD: doing well on Prevacid daily.   PLAN    Proceed with colonoscopy by Dr. Jena Gauss in near future: the risks, benefits, and alternatives have been discussed with the patient in detail. The patient states understanding and desires to proceed. Amitiza 8 mcg po BID Return in 1 year or sooner if needed   Gelene Mink, PhD, ANP-BC [facility] Gastroenterology

## 2021-12-15 NOTE — Telephone Encounter (Signed)
LMOVM to call back to schedule TCS with Dr. Rourk, ASA 2 

## 2021-12-15 NOTE — Patient Instructions (Signed)
We are arranging a colonoscopy with Dr. Jena Gauss in the near future.  You can try the lower dose of Amitiza that I sent to the pharmacy. You can take up to twice a day with food. Start out by taking just once per day and see how you do!  I will see you in one year!  Have a wonderful summer!  I enjoyed seeing you again today! As you know, I value our relationship and want to provide genuine, compassionate, and quality care. I welcome your feedback. If you receive a survey regarding your visit,  I greatly appreciate you taking time to fill this out. See you next time!  Gelene Mink, PhD, ANP-BC [facility] Gastroenter[facility]

## 2021-12-27 NOTE — Telephone Encounter (Signed)
Letter mailed

## 2021-12-28 DIAGNOSIS — K219 Gastro-esophageal reflux disease without esophagitis: Secondary | ICD-10-CM | POA: Diagnosis not present

## 2021-12-28 DIAGNOSIS — M159 Polyosteoarthritis, unspecified: Secondary | ICD-10-CM | POA: Diagnosis not present

## 2021-12-28 DIAGNOSIS — I10 Essential (primary) hypertension: Secondary | ICD-10-CM | POA: Diagnosis not present

## 2021-12-28 DIAGNOSIS — E785 Hyperlipidemia, unspecified: Secondary | ICD-10-CM | POA: Diagnosis not present

## 2022-01-05 NOTE — Telephone Encounter (Signed)
Pt returned call. She is not able to schedule anything right now due to her daughters schedule. She will call us once she is able to schedule procedure

## 2022-01-11 ENCOUNTER — Ambulatory Visit: Payer: Medicare Other | Admitting: Gastroenterology

## 2022-01-28 DIAGNOSIS — I10 Essential (primary) hypertension: Secondary | ICD-10-CM | POA: Diagnosis not present

## 2022-01-28 DIAGNOSIS — E782 Mixed hyperlipidemia: Secondary | ICD-10-CM | POA: Diagnosis not present

## 2022-02-02 NOTE — Addendum Note (Signed)
Addended by: Armstead Peaks on: 02/02/2022 03:02 PM   Modules accepted: Orders

## 2022-02-02 NOTE — Telephone Encounter (Addendum)
Pt called in. Ready to schedule procedure. She wanted next week before her daughter goes on vacation. Scheduled for 8/24 at 8:15an. She will come by office and pick up instructions with prep sample clenpiq.  PA approved via [facility] website. Auth# [id number], DOS: Feb 10, 2022 - May 11, 2022

## 2022-02-03 ENCOUNTER — Other Ambulatory Visit (HOSPITAL_COMMUNITY)
Admission: RE | Admit: 2022-02-03 | Discharge: 2022-02-03 | Disposition: A | Discharge status: Home / Self Care | Payer: Medicare Other | Source: Ambulatory Visit | Attending: Internal Medicine | Admitting: Internal Medicine

## 2022-02-03 DIAGNOSIS — Z800 Family history of malignant neoplasm of digestive organs: Secondary | ICD-10-CM | POA: Insufficient documentation

## 2022-02-03 DIAGNOSIS — Z1211 Encounter for screening for malignant neoplasm of colon: Secondary | ICD-10-CM | POA: Insufficient documentation

## 2022-02-03 LAB — BASIC METABOLIC PANEL
Anion gap: 9 (ref 5–15)
BUN: 20 mg/dL (ref 8–23)
CO2: 25 mmol/L (ref 22–32)
Calcium: 9.7 mg/dL (ref 8.9–10.3)
Chloride: 105 mmol/L (ref 98–111)
Creatinine, Ser: 1.15 mg/dL — ABNORMAL HIGH (ref 0.44–1.00)
GFR, Estimated: 51 mL/min — ABNORMAL LOW (ref >60)
Glucose, Bld: 120 mg/dL — ABNORMAL HIGH (ref 70–99)
Potassium: 3.1 mmol/L — ABNORMAL LOW (ref 3.5–5.1)
Sodium: 139 mmol/L (ref 135–145)

## 2022-02-07 ENCOUNTER — Other Ambulatory Visit: Payer: Self-pay

## 2022-02-07 MED ORDER — POTASSIUM CHLORIDE CRYS ER 20 MEQ PO TBCR: 20.0000 meq | EXTENDED_RELEASE_TABLET | Freq: Every day | ORAL | 0 refills | Status: DC

## 2022-02-10 ENCOUNTER — Ambulatory Visit (HOSPITAL_COMMUNITY): Payer: Medicare Other | Admitting: Anesthesiology

## 2022-02-10 ENCOUNTER — Encounter (HOSPITAL_COMMUNITY): Payer: Self-pay | Admitting: Internal Medicine

## 2022-02-10 ENCOUNTER — Other Ambulatory Visit: Payer: Self-pay

## 2022-02-10 ENCOUNTER — Ambulatory Visit (HOSPITAL_BASED_OUTPATIENT_CLINIC_OR_DEPARTMENT_OTHER): Payer: Medicare Other | Admitting: Anesthesiology

## 2022-02-10 ENCOUNTER — Encounter (HOSPITAL_COMMUNITY)
Admission: RE | Disposition: A | Discharge status: Home / Self Care | Payer: Self-pay | Source: Home / Self Care | Attending: Internal Medicine

## 2022-02-10 ENCOUNTER — Ambulatory Visit (HOSPITAL_COMMUNITY)
Admission: RE | Admit: 2022-02-10 | Discharge: 2022-02-10 | Disposition: A | Discharge status: Home / Self Care | Payer: Medicare Other | Attending: Internal Medicine | Admitting: Internal Medicine

## 2022-02-10 DIAGNOSIS — K5730 Diverticulosis of large intestine without perforation or abscess without bleeding: Secondary | ICD-10-CM

## 2022-02-10 DIAGNOSIS — Z800 Family history of malignant neoplasm of digestive organs: Secondary | ICD-10-CM | POA: Diagnosis not present

## 2022-02-10 DIAGNOSIS — Z1211 Encounter for screening for malignant neoplasm of colon: Secondary | ICD-10-CM | POA: Diagnosis not present

## 2022-02-10 DIAGNOSIS — K641 Second degree hemorrhoids: Secondary | ICD-10-CM | POA: Insufficient documentation

## 2022-02-10 DIAGNOSIS — K635 Polyp of colon: Secondary | ICD-10-CM | POA: Insufficient documentation

## 2022-02-10 DIAGNOSIS — K219 Gastro-esophageal reflux disease without esophagitis: Secondary | ICD-10-CM | POA: Insufficient documentation

## 2022-02-10 DIAGNOSIS — Z139 Encounter for screening, unspecified: Secondary | ICD-10-CM | POA: Diagnosis not present

## 2022-02-10 DIAGNOSIS — I10 Essential (primary) hypertension: Secondary | ICD-10-CM | POA: Insufficient documentation

## 2022-02-10 HISTORY — PX: COLONOSCOPY WITH PROPOFOL: SHX5780

## 2022-02-10 HISTORY — PX: POLYPECTOMY: SHX5525

## 2022-02-10 SURGERY — COLONOSCOPY WITH PROPOFOL
Anesthesia: General

## 2022-02-10 MED ORDER — PROPOFOL 500 MG/50ML IV EMUL
INTRAVENOUS | Status: AC
  Filled 2022-02-10: qty 50

## 2022-02-10 MED ORDER — PROPOFOL 10 MG/ML IV BOLUS
INTRAVENOUS | Status: DC | PRN
  Administered 2022-02-10: 50 mg via INTRAVENOUS

## 2022-02-10 MED ORDER — LIDOCAINE HCL 1 % IJ SOLN
INTRAMUSCULAR | Status: DC | PRN
  Administered 2022-02-10: 50 mg via INTRADERMAL

## 2022-02-10 MED ORDER — PROPOFOL 500 MG/50ML IV EMUL
INTRAVENOUS | Status: DC | PRN
  Administered 2022-02-10: 150 ug/kg/min via INTRAVENOUS

## 2022-02-10 MED ORDER — LIDOCAINE HCL (PF) 2 % IJ SOLN
INTRAMUSCULAR | Status: AC
  Filled 2022-02-10: qty 5

## 2022-02-10 MED ORDER — LACTATED RINGERS IV SOLN: INTRAVENOUS | Status: DC | PRN

## 2022-02-10 MED ORDER — PROPOFOL 1000 MG/100ML IV EMUL
INTRAVENOUS | Status: AC
  Filled 2022-02-10: qty 100

## 2022-02-10 NOTE — Discharge Instructions (Addendum)
  Colonoscopy Discharge Instructions  Read the instructions outlined below and refer to this sheet in the next few weeks. These discharge instructions provide you with general information on caring for yourself after you leave the hospital. Your doctor may also give you specific instructions. While your treatment has been planned according to the most current medical practices available, unavoidable complications occasionally occur. If you have any problems or questions after discharge, call Dr. Jena Gauss at [phone]. ACTIVITY You may resume your regular activity, but move at a slower pace for the next 24 hours.  Take frequent rest periods for the next 24 hours.  Walking will help get rid of the air and reduce the bloated feeling in your belly (abdomen).  No driving for 24 hours (because of the medicine (anesthesia) used during the test).   Do not sign any important legal documents or operate any machinery for 24 hours (because of the anesthesia used during the test).  NUTRITION Drink plenty of fluids.  You may resume your normal diet as instructed by your doctor.  Begin with a light meal and progress to your normal diet. Heavy or fried foods are harder to digest and may make you feel sick to your stomach (nauseated).  Avoid alcoholic beverages for 24 hours or as instructed.  MEDICATIONS You may resume your normal medications unless your doctor tells you otherwise.  WHAT YOU CAN EXPECT TODAY Some feelings of bloating in the abdomen.  Passage of more gas than usual.  Spotting of blood in your stool or on the toilet paper.  IF YOU HAD POLYPS REMOVED DURING THE COLONOSCOPY: No aspirin products for 7 days or as instructed.  No alcohol for 7 days or as instructed.  Eat a soft diet for the next 24 hours.  FINDING OUT THE RESULTS OF YOUR TEST Not all test results are available during your visit. If your test results are not back during the visit, make an appointment with your caregiver to find out the  results. Do not assume everything is normal if you have not heard from your caregiver or the medical facility. It is important for you to follow up on all of your test results.  SEEK IMMEDIATE MEDICAL ATTENTION IF: You have more than a spotting of blood in your stool.  Your belly is swollen (abdominal distention).  You are nauseated or vomiting.  You have a temperature over 101.  You have abdominal pain or discomfort that is severe or gets worse throughout the day.    1 polyp removed from your colon today  You still have persisting hemorrhoids.  Diverticulosis, hemorrhoid and colon polyp information provided  Further recommendations to follow pending review of pathology report  Pamphlet on hemorrhoid banding provided  He would likely benefit from additional hemorrhoid banding  Office visit with Lewie Loron in 3 months  At patient request, I called Mirna Mires at [phone] -reviewed findings and recommendations

## 2022-02-10 NOTE — Anesthesia Preprocedure Evaluation (Signed)
Anesthesia Evaluation  Patient identified by MRN, date of birth, ID band Patient awake    Reviewed: Allergy & Precautions, H&P , NPO status , Patient's Chart, lab work & pertinent test results, reviewed documented beta blocker date and time   History of Anesthesia Complications (+) PROLONGED EMERGENCE and history of anesthetic complications  Airway Mallampati: II  TM Distance: >3 FB Neck ROM: full    Dental no notable dental hx.    Pulmonary neg pulmonary ROS,    Pulmonary exam normal breath sounds clear to auscultation       Cardiovascular Exercise Tolerance: Good hypertension, negative cardio ROS   Rhythm:regular Rate:Normal     Neuro/Psych negative neurological ROS  negative psych ROS   GI/Hepatic Neg liver ROS, GERD  Medicated,  Endo/Other  negative endocrine ROS  Renal/GU negative Renal ROS  negative genitourinary   Musculoskeletal   Abdominal   Peds  Hematology negative hematology ROS (+)   Anesthesia Other Findings   Reproductive/Obstetrics negative OB ROS                             Anesthesia Physical Anesthesia Plan  ASA: 2  Anesthesia Plan: General   Post-op Pain Management:    Induction:   PONV Risk Score and Plan: Propofol infusion  Airway Management Planned:   Additional Equipment:   Intra-op Plan:   Post-operative Plan:   Informed Consent: I have reviewed the patients History and Physical, chart, labs and discussed the procedure including the risks, benefits and alternatives for the proposed anesthesia with the patient or authorized representative who has indicated his/her understanding and acceptance.     Dental Advisory Given  Plan Discussed with: CRNA  Anesthesia Plan Comments:         Anesthesia Quick Evaluation

## 2022-02-10 NOTE — Addendum Note (Signed)
Addendum  created 02/10/22 1043 by Moshe Salisbury, CRNA   Clinical Note Signed

## 2022-02-10 NOTE — H&P (Signed)
@LOGO @   Primary Care Physician:  , MD Primary Gastroenterologist:  Dr. Benetta Spar  Pre-Procedure History & Physical: HPI:  Tammy Kramer is a 71 y.o. female is here for a screening colonoscopy.  Positive family history colon cancer first-degree relative.  Negative colonoscopy 2018 (hemorrhoids).  Here for high  risk examination.  Brother with colon cancer diagnosed age 70.  Past Medical History:  Diagnosis Date   Arthritis    Back pain    Chronic constipation    Complication of anesthesia    difficulty waking up from anesthesia   GERD (gastroesophageal reflux disease)    Hemorrhoids    Hypercholesterolemia    Hypertension    Seasonal allergies     Past Surgical History:  Procedure Laterality Date   ABDOMINAL HYSTERECTOMY     BACK SURGERY     C4-C5 fusion     CATARACT EXTRACTION W/PHACO Right 05/02/2017   Procedure: CATARACT EXTRACTION PHACO AND INTRAOCULAR LENS PLACEMENT (IOC);  Surgeon: 05/04/2017, MD;  Location: [facility];  Service: Ophthalmology;  Laterality: Right;  CDE: 5.99   CATARACT EXTRACTION W/PHACO Left 05/16/2017   Procedure: CATARACT EXTRACTION PHACO AND INTRAOCULAR LENS PLACEMENT (IOC);  Surgeon: 05/18/2017, MD;  Location: [facility];  Service: Ophthalmology;  Laterality: Left;  CDE: 3.80   cholecystectomy     CHOLECYSTECTOMY     COLONOSCOPY   10/16/2006   NUR: 3 mm polyp ablated via cold biopsy from the splenic flexure.External hemorrhoids, hyperplastic polyp   COLONOSCOPY  Feb 2011   Dr. Mar 2011: internal hemorrhoids, otherwise normal.    COLONOSCOPY N/A 04/08/2014   RMR: Internal hemorrhoids; colonic diverticulosis( few  as described above) hematochezia likely secondary to hemorrhoids in the setting of constipation.   COLONOSCOPY N/A 12/08/2016   Dr. 12/10/2016: none bleeding internal hemorrhoids which were small. Next colonoscopy in five years.   DILATION AND CURETTAGE OF UTERUS     multiple   ESOPHAGOGASTRODUODENOSCOPY  06/14/2006   NUR:  Normal esophagogastroduodenoscopy   ESOPHAGOGASTRODUODENOSCOPY  Feb 2011   Dr. Mar 2011: inflamed-appearing esophagus with overlying distal esophageal erosions superimposed on noncritical Schatzki ring, s/p dilation and biopsy. Benign path   ESOPHAGOGASTRODUODENOSCOPY N/A 12/08/2016   Dr. 12/10/2016: LA grade A esophagitis, mild schatzki's ring at the GE junction, small hiatal hernia, esophageal dilation performed. Prevacid increased to BID.   foot surgery     X 5-bone spurs and tendon release; morton's neuroma.   HAND SURGERY Right    X 2-carpal tunnel release and ganglion cyst removed.   HEMORRHOID BANDING  2016   Dr.Kelcey Korus   KNEE ARTHROSCOPY WITH LATERAL MENISECTOMY Left 03/27/2015   Procedure: KNEE ARTHROSCOPY WITH LATERAL MENISECTOMY;  Surgeon: 05/27/2015, MD;  Location: [facility];  Service: Orthopedics;  Laterality: Left;   ROTATOR CUFF REPAIR Right     Prior to Admission medications   Medication Sig Start Date End Date Taking? Authorizing Provider  acetaminophen (TYLENOL) 325 MG tablet Take 650 mg by mouth every 6 (six) hours as needed for moderate pain.   Yes [provider]  allopurinol (ZYLOPRIM) 100 MG tablet Take 100 mg by mouth daily. 11/30/21  Yes [provider]  atorvastatin (LIPITOR) 40 MG tablet Take 40 mg by mouth daily.  10/03/16  Yes [provider]  colchicine 0.6 MG tablet Take 1.2mg  (2 tablets) then 0.6mg  (1 tablet) 1 hour after. Then, take 1 tablet every day for 7 days. Patient taking differently: Take 0.6 mg by mouth daily as needed (gout flare).  12/17/20  Yes McDonald, Adam R, DPM  fluticasone (FLONASE) 50 MCG/ACT nasal spray Place 1 spray into both nostrils daily as needed for allergies. 04/27/18  Yes [provider]  hydrocortisone (PROCTOZONE-HC) 2.5 % rectal cream Place 1 application rectally 2 (two) times daily. Patient taking differently: Place 1 application  rectally as needed. 07/09/19  Yes Gelene Mink, NP  ibuprofen (ADVIL) 800  MG tablet Take 800 mg by mouth 2 (two) times daily as needed for moderate pain. 08/27/21  Yes [provider]  lansoprazole (PREVACID) 15 MG capsule Take 15 mg by mouth daily. 08/07/20  Yes [provider]  Lidocaine HCl (ASPERCREME LIDOCAINE) 4 % CREA Apply 1 Application topically daily as needed (pain).   Yes [provider]  loratadine (CLARITIN) 10 MG tablet Take 10 mg by mouth daily.   Yes [provider]  lubiprostone (AMITIZA) 8 MCG capsule Take 1 capsule (8 mcg total) by mouth 2 (two) times daily with a meal. Patient taking differently: Take 8 mcg by mouth daily as needed for constipation. 12/15/21  Yes Gelene Mink, NP  METAMUCIL FIBER PO Take 1 Scoop by mouth every other day.   Yes [provider]  potassium chloride SA (KLOR-CON M) 20 MEQ tablet Take 1 tablet (20 mEq total) by mouth daily. 02/07/22  Yes Shandell Giovanni, Gerrit Friends, MD  sucralfate (CARAFATE) 1 g tablet Take 1 tablet (1 g total) by mouth 4 (four) times daily -  with meals and at bedtime. Patient taking differently: Take 1 g by mouth daily as needed (acid reflux). 07/12/18  Yes Gerhard Munch, MD  TURMERIC PO Take 1 capsule by mouth daily as needed (inflammation).   Yes [provider]  valsartan-hydrochlorothiazide (DIOVAN-HCT) 160-25 MG tablet Take 1 tablet by mouth daily. 08/24/18  Yes [provider]  cycloSPORINE (RESTASIS) 0.05 % ophthalmic emulsion Place 1 drop into both eyes 2 (two) times daily as needed (dry eyes).    [provider]  diclofenac (VOLTAREN) 75 MG EC tablet Take 1 tablet (75 mg total) by mouth at bedtime. Patient taking differently: Take 75 mg by mouth daily as needed (knee pain). 01/16/20   Vickki Hearing, MD  gabapentin (NEURONTIN) 100 MG capsule Take 1 capsule (100 mg total) by mouth 3 (three) times daily. Patient not taking: Reported on 02/04/2022 12/03/20   Vickki Hearing, MD  ibuprofen (ADVIL,MOTRIN) 200 MG tablet Take 200 mg by  mouth every 8 (eight) hours as needed (for pain).     [provider]  naproxen (NAPROSYN) 500 MG tablet Take 500 mg by mouth 2 (two) times daily as needed for moderate pain.    [provider]    Allergies as of 02/02/2022 - Review Complete 12/15/2021  Allergen Reaction Noted   Codeine Other (See Comments)    Meperidine hcl Other (See Comments)     Family History  Problem Relation Age of Onset   Hypertension Mother    Aneurysm Mother    Diabetes Mother    Heart disease Father    Heart disease Son    Colon cancer Brother        age 50, deceased 2 weeks later    Social History   Socioeconomic History   Marital status: Divorced    Spouse name: Not on file   Number of children: Not on file   Years of education: Not on file   Highest education level: Not on file  Occupational History   Not on file  Tobacco Use   Smoking status: Never   Smokeless tobacco: Never   Tobacco comments:    Never smoked  Vaping Use   Vaping Use: Never used  Substance and Sexual Activity   Alcohol use: No   Drug use: No   Sexual activity: Yes    Birth control/protection: Surgical    Comment: hyst  Other Topics Concern   Not on file  Social History Narrative   Not on file   Social Determinants of Health   Financial Resource Strain: Not on file  Food Insecurity: Not on file  Transportation Needs: Not on file  Physical Activity: Not on file  Stress: Not on file  Social Connections: Not on file  Intimate Partner Violence: Not on file    Review of Systems: See HPI, otherwise negative ROS  Physical Exam: BP (!) 146/64   Pulse 88   Temp 98.6 F (37 C) (Oral)   Resp 16   Ht 5' 4.5" (1.638 m)   Wt 84.8 kg   SpO2 98%   BMI 31.60 kg/m  General:   Alert,  Well-developed, well-nourished, pleasant and cooperative in NAD Lungs:  Clear throughout to auscultation.   No wheezes, crackles, or rhonchi. No acute distress. Heart:  Regular rate and rhythm; no murmurs, clicks,  rubs,  or gallops. Abdomen:  Soft, nontender and nondistended. No masses, hepatosplenomegaly or hernias noted. Normal bowel sounds, without guarding, and without rebound.    Impression/Plan: Tammy Kramer is now here to undergo a screening colonoscopy.  High risk screening examination  Risks, benefits, limitations, imponderables and alternatives regarding colonoscopy have been reviewed with the patient. Questions have been answered. All parties agreeable.     Notice:  This dictation was prepared with Dragon dictation along with smaller phrase technology. Any transcriptional errors that result from this process are unintentional and may not be corrected upon review.

## 2022-02-10 NOTE — Op Note (Signed)
[facility] Patient Name: Tammy Kramer Procedure Date: 02/10/2022 8:08 AM MRN: [id number] Date of Birth: July 10, 1950 Attending MD: Gennette Pac , MD CSN: [id number] Age: 71 Admit Type: Outpatient Procedure:                Colonoscopy Indications:              Screening in patient at increased risk: Family                            history of 1st-degree relative with colorectal                            cancer Providers:                Gennette Pac, MD, Sheran Fava,                            Kristine L. Jessee Avers, Technician, Roseanne Kaufman, Pensions consultant Referring MD:              Medicines:                Propofol per Anesthesia Complications:            No immediate complications. Estimated Blood Loss:     Estimated blood loss was minimal. Procedure:                Pre-Anesthesia Assessment:                           - Prior to the procedure, a History and Physical                            was performed, and patient medications and                            allergies were reviewed. The patient's tolerance of                            previous anesthesia was also reviewed. The risks                            and benefits of the procedure and the sedation                            options and risks were discussed with the patient.                            All questions were answered, and informed consent                            was obtained. Prior Anticoagulants: The patient has                            taken no previous anticoagulant  or antiplatelet                            agents. ASA Grade Assessment: II - A patient with                            mild systemic disease. After reviewing the risks                            and benefits, the patient was deemed in                            satisfactory condition to undergo the procedure.                           After obtaining informed consent, the  colonoscope                            was passed under direct vision. Throughout the                            procedure, the patient's blood pressure, pulse, and                            oxygen saturations were monitored continuously. The                            [id number]) scope was introduced through the                            anus and advanced to the the cecum, identified by                            appendiceal orifice and ileocecal valve. The                            colonoscopy was performed without difficulty. The                            patient tolerated the procedure well. The quality                            of the bowel preparation was adequate. Scope In: 8:35:04 AM Scope Out: 8:48:27 AM Scope Withdrawal Time: 0 hours 10 minutes 38 seconds  Total Procedure Duration: 0 hours 13 minutes 23 seconds  Findings:      The perianal and digital rectal examinations were normal.      A 8 mm polyp was found in the splenic flexure. The polyp was sessile.       The polyp was removed with a cold snare. Resection and retrieval were       complete. Estimated blood loss was minimal.      A few small-mouthed diverticula were found in the entire colon.      Non-bleeding internal hemorrhoids were found during retroflexion. The  hemorrhoids were moderate, large and Grade II (internal hemorrhoids that       prolapse but reduce spontaneously).      The exam was otherwise without abnormality on direct and retroflexion       views. Impression:               - One 8 mm polyp at the splenic flexure, removed                            with a cold snare. Resected and retrieved.                           - Diverticulosis in the entire examined colon.                           - Non-bleeding internal hemorrhoids.                           - The examination was otherwise normal on direct                            and retroflexion views. Patient still has symptoms                             relating to hemorrhoids. She has notable grade 2 /                            3 hemorrhoids. Not grade 4. She did respond to                            hemorrhoid banding previously. She would likely                            benefit from another round of this line of therapy. Moderate Sedation:      Moderate (conscious) sedation was personally administered by an       anesthesia professional. The following parameters were monitored: oxygen       saturation, heart rate, blood pressure, respiratory rate, EKG, adequacy       of pulmonary ventilation, and response to care. Recommendation:           - Patient has a contact number available for                            emergencies. The signs and symptoms of potential                            delayed complications were discussed with the                            patient. Return to normal activities tomorrow.                            Written discharge instructions were provided to the  patient.                           - Advance diet as tolerated.                           - Continue present medications.                           - Repeat colonoscopy date to be determined after                            pending pathology results are reviewed for                            surveillance.                           - Return to GI office in 3 months. Hemorrhoid                            banding pamphlet provided. Procedure Code(s):        --- Professional ---                           [phone], Colonoscopy, flexible; with removal of                            tumor(s), polyp(s), or other lesion(s) by snare                            technique Diagnosis Code(s):        --- Professional ---                           Z80.0, Family history of malignant neoplasm of                            digestive organs                           K63.5, Polyp of colon                           K64.1, Second degree  hemorrhoids                           K57.30, Diverticulosis of large intestine without                            perforation or abscess without bleeding CPT copyright 2019 American Medical Association. All rights reserved. The codes documented in this report are preliminary and upon coder review may  be revised to meet current compliance requirements. Gerrit Friends. Keyasia Jolliff, MD Gennette Pac, MD 02/10/2022 9:42:24 AM This report has been signed electronically. Number of Addenda: 0

## 2022-02-10 NOTE — Transfer of Care (Addendum)
Immediate Anesthesia Transfer of Care Note  Patient: Tammy Kramer  Procedure(s) Performed: COLONOSCOPY WITH PROPOFOL POLYPECTOMY  Patient Location: Endoscopy Unit  Anesthesia Type:General  Level of Consciousness: awake  Airway & Oxygen Therapy: Patient Spontanous Breathing  Post-op Assessment: Report given to RN  Post vital signs: Reviewed and stable  Last Vitals:  Vitals Value Taken Time  BP 102/60   Temp 37   Pulse 78   Resp 12   SpO2 97     Last Pain:  Vitals:   02/10/22 0829  TempSrc:   PainSc: 0-No pain      Patients Stated Pain Goal: 6 (02/10/22 0705)  Complications: No notable events documented.

## 2022-02-10 NOTE — Anesthesia Postprocedure Evaluation (Signed)
Anesthesia Post Note  Patient: Tammy Kramer  Procedure(s) Performed: COLONOSCOPY WITH PROPOFOL POLYPECTOMY  Patient location during evaluation: Short Stay Anesthesia Type: General Level of consciousness: awake and alert Pain management: pain level controlled Vital Signs Assessment: post-procedure vital signs reviewed and stable Respiratory status: spontaneous breathing Cardiovascular status: blood pressure returned to baseline and stable Postop Assessment: no apparent nausea or vomiting Anesthetic complications: no   No notable events documented.   Last Vitals:  Vitals:   02/10/22 0705  BP: (!) 146/64  Pulse: 88  Resp: 16  Temp: 37 C  SpO2: 98%    Last Pain:  Vitals:   02/10/22 0829  TempSrc:   PainSc: 0-No pain                 Lolly Glaus

## 2022-02-11 ENCOUNTER — Other Ambulatory Visit (HOSPITAL_COMMUNITY): Payer: Self-pay | Admitting: Internal Medicine

## 2022-02-11 ENCOUNTER — Encounter: Payer: Self-pay | Admitting: Internal Medicine

## 2022-02-11 DIAGNOSIS — Z1231 Encounter for screening mammogram for malignant neoplasm of breast: Secondary | ICD-10-CM

## 2022-02-11 LAB — SURGICAL PATHOLOGY

## 2022-02-15 ENCOUNTER — Encounter (HOSPITAL_COMMUNITY): Payer: Self-pay | Admitting: Internal Medicine

## 2022-02-28 DIAGNOSIS — E785 Hyperlipidemia, unspecified: Secondary | ICD-10-CM | POA: Diagnosis not present

## 2022-02-28 DIAGNOSIS — I10 Essential (primary) hypertension: Secondary | ICD-10-CM | POA: Diagnosis not present

## 2022-03-07 ENCOUNTER — Ambulatory Visit (HOSPITAL_COMMUNITY)
Admission: RE | Admit: 2022-03-07 | Discharge: 2022-03-07 | Disposition: A | Discharge status: Home / Self Care | Payer: Medicare Other | Source: Ambulatory Visit | Attending: Internal Medicine | Admitting: Internal Medicine

## 2022-03-07 DIAGNOSIS — Z1231 Encounter for screening mammogram for malignant neoplasm of breast: Secondary | ICD-10-CM | POA: Diagnosis not present

## 2022-03-09 ENCOUNTER — Encounter: Payer: Self-pay | Admitting: *Deleted

## 2022-03-11 ENCOUNTER — Other Ambulatory Visit (HOSPITAL_COMMUNITY)
Admission: RE | Admit: 2022-03-11 | Discharge: 2022-03-11 | Disposition: A | Discharge status: Home / Self Care | Payer: Medicare Other | Source: Ambulatory Visit | Attending: Internal Medicine | Admitting: Internal Medicine

## 2022-03-11 DIAGNOSIS — I10 Essential (primary) hypertension: Secondary | ICD-10-CM | POA: Diagnosis not present

## 2022-03-11 LAB — BASIC METABOLIC PANEL
Anion gap: 10 (ref 5–15)
BUN: 15 mg/dL (ref 8–23)
CO2: 25 mmol/L (ref 22–32)
Calcium: 9.9 mg/dL (ref 8.9–10.3)
Chloride: 104 mmol/L (ref 98–111)
Creatinine, Ser: 1.12 mg/dL — ABNORMAL HIGH (ref 0.44–1.00)
GFR, Estimated: 52 mL/min — ABNORMAL LOW (ref >60)
Glucose, Bld: 124 mg/dL — ABNORMAL HIGH (ref 70–99)
Potassium: 3.5 mmol/L (ref 3.5–5.1)
Sodium: 139 mmol/L (ref 135–145)

## 2022-03-29 DIAGNOSIS — Z23 Encounter for immunization: Secondary | ICD-10-CM | POA: Diagnosis not present

## 2022-03-30 DIAGNOSIS — E785 Hyperlipidemia, unspecified: Secondary | ICD-10-CM | POA: Diagnosis not present

## 2022-03-30 DIAGNOSIS — I10 Essential (primary) hypertension: Secondary | ICD-10-CM | POA: Diagnosis not present

## 2022-03-31 ENCOUNTER — Ambulatory Visit: Payer: Medicare Other | Admitting: Podiatry

## 2022-04-28 ENCOUNTER — Encounter: Payer: Self-pay | Admitting: Gastroenterology

## 2022-04-28 ENCOUNTER — Ambulatory Visit: Payer: Medicare Other | Admitting: Gastroenterology

## 2022-04-28 ENCOUNTER — Ambulatory Visit (INDEPENDENT_AMBULATORY_CARE_PROVIDER_SITE_OTHER): Payer: Medicare Other | Admitting: Gastroenterology

## 2022-04-28 VITALS — BP 147/79 | HR 92 | Temp 97.6°F | Ht 64.5 in | Wt 185.0 lb

## 2022-04-28 DIAGNOSIS — K642 Third degree hemorrhoids: Secondary | ICD-10-CM

## 2022-04-28 DIAGNOSIS — K641 Second degree hemorrhoids: Secondary | ICD-10-CM | POA: Diagnosis not present

## 2022-04-28 DIAGNOSIS — K5900 Constipation, unspecified: Secondary | ICD-10-CM

## 2022-04-28 NOTE — Progress Notes (Signed)
   [facility] Banding Procedure Note:   Tammy Kramer is a 71 y.o. female presenting today for consideration of hemorrhoid banding. She has been experiencing recurrent toilet tissue hematochezia in the setting on uncontrolled constipation.   Last office visit 12/15/2021: Bowel movement once to twice per day, formed stools.  Currently taking half a glass of Metamucil daily, Amitiza 24 mcg once per week.  Sometimes having incomplete bowel movements.  Denies any abdominal pain, nausea, vomiting, dysphagia.  GERD controlled with Prevacid once daily.  Family history of colon cancer in her brother.  Advised to stop Amitiza 24 mcg once per week and start Amitiza 8 mcg once to twice daily with food.  Colonoscopy 02/10/22: - 8 mm polyp at splenic flexure (hyperplastic) - Diverticulosis in the entire examined colon - Nonbleeding internal hemorrhoids. - Repeat colonoscopy in 5 years if health permits. - Recommended repeat hemorrhoid banding  The patient presents with symptomatic grade 2/3 hemorrhoids, unresponsive to maximal medical therapy, requesting rubber band ligation of his/her hemorrhoidal disease. All risks, benefits, and alternative forms of therapy were described and informed consent was obtained.  In the left lateral decubitus position (if anoscopy is performed) anoscopic examination revealed grade 2/3 hemorrhoids in the right posterior and anterior positions with noticeable erythema.  The decision was made to band the right posterior internal hemorrhoid, and the [facility] O'Regan System was used to perform band ligation without complication. Digital anorectal examination was then performed to assure proper positioning of the band, and to adjust the banded tissue as required. The patient was discharged home without pain or other issues. Dietary and behavioral recommendations were given and (if necessary prescriptions were given), along with follow-up instructions. The patient will return in 2-3 for followup and  possible additional banding as required.  No complications were encountered and the patient tolerated the procedure well.   May consider increasing Amitiza to 16 mcg twice daily or continue at current dose of 8 mcg BID and increasing stool softener to twice daily.    Brooke Bonito, MSN, FNP-BC, AGACNP-BC [facility]

## 2022-04-28 NOTE — Patient Instructions (Signed)
Continue to avoid straining.   Limit toilet time to 2-3 minutes at the most.   Avoid constipation. Take 2 tablespoons of natural wheat bran, natural oat bran, flax, Benefiber or any over the counter fiber supplement and increase your water intake to 7-8 glasses daily.  Continue Amitiza 8 mcg twice daily with food and daily stool softener. If you would like you could consider increasing stool softener to twice daily or we could try increasing your Amitiza to 16 mcg twice daily. Let me know if you would like to change your prescription.   Occasionally, you may have more bleeding than usual after the banding procedure. This is often from the untreated hemorrhoids rather than the treated one. Don't be concerned if there is a tablespoon or so of blood. If there is more blood than this, lie flat with your bottom higher than your head and apply an ice pack to the area. If the bleeding does not stop within a half an hour or if you feel faint, have severe pain, chills, fever or difficulty passing urine (very rare) or other problems, you should call us at [phone] or report to the nearest emergency room.call our office at [phone] or go to the emergency room.  Please call me with any concerns!  The procedure you have had should have been relatively painless since the banding of the area involved does not have nerve endings and there is no pain sensation. The rubber band cuts off the blood supply to the hemorrhoid and the band may fall off as soon as 48 hours after the banding (the band may occasionally be seen in the toilet bowl following a bowel movement). You may notice a temporary feeling of fullness in the rectum which should respond adequately to plain Tylenol or Motrin.  I will see you back in follow-up and/or for additional banding.   Brooke Bonito, MSN, FNP-BC, AGACNP-BC [facility]

## 2022-04-28 NOTE — Progress Notes (Deleted)
GI Office Note    Referring Provider: Benetta Spar* Primary Care Physician:  Benetta Spar, MD Primary Gastroenterologist: Gerrit Friends.Rourk, MD  Date:  04/28/2022  ID:  Tammy Kramer, DOB 1951-03-04, MRN [id number]   Chief Complaint   No chief complaint on file.   History of Present Illness  Tammy Kramer is a 71 y.o. female with a history of GERD, constipation, Grade 3 hemorrhoids s/p banding in 2020*** presenting today for post procedure follow up to discuss repeat hemorrhoid banding.   Last office visit 12/15/2021: Bowel movement once to twice per day, formed stools.  Currently taking half a glass of Metamucil daily, Amitiza 24 mcg once per week.  Sometimes having incomplete bowel movements.  Denies any abdominal pain, nausea, vomiting, dysphagia.  GERD controlled with Prevacid once daily.  Family history of colon cancer in her brother.  Advised to stop Amitiza 24 mcg once per week and start Amitiza 8 mcg once to twice daily with food.  Colonoscopy 02/10/22: - 8 mm polyp at splenic flexure (hyperplastic) - Diverticulosis in the entire examined colon - Nonbleeding internal hemorrhoids. - Repeat colonoscopy in 5 years if health permits. - Recommended repeat hemorrhoid banding   Today:   Current Outpatient Medications  Medication Sig Dispense Refill   acetaminophen (TYLENOL) 325 MG tablet Take 650 mg by mouth every 6 (six) hours as needed for moderate pain.     allopurinol (ZYLOPRIM) 100 MG tablet Take 100 mg by mouth daily.     atorvastatin (LIPITOR) 40 MG tablet Take 40 mg by mouth daily.      colchicine 0.6 MG tablet Take 1.2mg  (2 tablets) then 0.6mg  (1 tablet) 1 hour after. Then, take 1 tablet every day for 7 days. (Patient taking differently: Take 0.6 mg by mouth daily as needed (gout flare).) 10 tablet 2   diclofenac (VOLTAREN) 75 MG EC tablet Take 1 tablet (75 mg total) by mouth at bedtime. (Patient taking differently: Take 75 mg by mouth daily as  needed (knee pain).) 30 tablet 2   fluticasone (FLONASE) 50 MCG/ACT nasal spray Place 1 spray into both nostrils daily as needed for allergies.  3   ibuprofen (ADVIL) 800 MG tablet Take 800 mg by mouth 2 (two) times daily as needed for moderate pain.     ibuprofen (ADVIL,MOTRIN) 200 MG tablet Take 200 mg by mouth every 8 (eight) hours as needed (for pain).      lansoprazole (PREVACID) 15 MG capsule Take 15 mg by mouth daily.     Lidocaine HCl (ASPERCREME LIDOCAINE) 4 % CREA Apply 1 Application topically daily as needed (pain).     loratadine (CLARITIN) 10 MG tablet Take 10 mg by mouth daily.     lubiprostone (AMITIZA) 8 MCG capsule Take 1 capsule (8 mcg total) by mouth 2 (two) times daily with a meal. (Patient taking differently: Take 8 mcg by mouth daily as needed for constipation.) 60 capsule 3   METAMUCIL FIBER PO Take 1 Scoop by mouth every other day.     TURMERIC PO Take 1 capsule by mouth daily as needed (inflammation).     valsartan-hydrochlorothiazide (DIOVAN-HCT) 160-25 MG tablet Take 1 tablet by mouth daily.     cycloSPORINE (RESTASIS) 0.05 % ophthalmic emulsion Place 1 drop into both eyes 2 (two) times daily as needed (dry eyes). (Patient not taking: Reported on 04/28/2022)     gabapentin (NEURONTIN) 100 MG capsule Take 1 capsule (100 mg total) by mouth 3 (three) times daily. (Patient  not taking: Reported on 02/04/2022) 60 capsule 2   hydrocortisone (PROCTOZONE-HC) 2.5 % rectal cream Place 1 application rectally 2 (two) times daily. (Patient not taking: Reported on 04/28/2022) 30 g 3   naproxen (NAPROSYN) 500 MG tablet Take 500 mg by mouth 2 (two) times daily as needed for moderate pain. (Patient not taking: Reported on 04/28/2022)     potassium chloride SA (KLOR-CON M) 20 MEQ tablet Take 1 tablet (20 mEq total) by mouth daily. (Patient not taking: Reported on 04/28/2022) 5 tablet 0   sucralfate (CARAFATE) 1 g tablet Take 1 tablet (1 g total) by mouth 4 (four) times daily -  with meals and at  bedtime. (Patient not taking: Reported on 04/28/2022) 21 tablet 0   No current facility-administered medications for this visit.    Past Medical History:  Diagnosis Date   Arthritis    Back pain    Chronic constipation    Complication of anesthesia    difficulty waking up from anesthesia   GERD (gastroesophageal reflux disease)    Hemorrhoids    Hypercholesterolemia    Hypertension    Seasonal allergies     Past Surgical History:  Procedure Laterality Date   ABDOMINAL HYSTERECTOMY     BACK SURGERY     C4-C5 fusion     CATARACT EXTRACTION W/PHACO Right 05/02/2017   Procedure: CATARACT EXTRACTION PHACO AND INTRAOCULAR LENS PLACEMENT (IOC);  Surgeon: Jethro Bolus, MD;  Location: [facility];  Service: Ophthalmology;  Laterality: Right;  CDE: 5.99   CATARACT EXTRACTION W/PHACO Left 05/16/2017   Procedure: CATARACT EXTRACTION PHACO AND INTRAOCULAR LENS PLACEMENT (IOC);  Surgeon: Jethro Bolus, MD;  Location: [facility];  Service: Ophthalmology;  Laterality: Left;  CDE: 3.80   cholecystectomy     CHOLECYSTECTOMY     COLONOSCOPY   10/16/2006   NUR: 3 mm polyp ablated via cold biopsy from the splenic flexure.External hemorrhoids, hyperplastic polyp   COLONOSCOPY  Feb 2011   Dr. Jena Gauss: internal hemorrhoids, otherwise normal.    COLONOSCOPY N/A 04/08/2014   RMR: Internal hemorrhoids; colonic diverticulosis( few  as described above) hematochezia likely secondary to hemorrhoids in the setting of constipation.   COLONOSCOPY N/A 12/08/2016   Dr. Jena Gauss: none bleeding internal hemorrhoids which were small. Next colonoscopy in five years.   COLONOSCOPY WITH PROPOFOL N/A 02/10/2022   Procedure: COLONOSCOPY WITH PROPOFOL;  Surgeon: Corbin Ade, MD;  Location: [facility];  Service: Endoscopy;  Laterality: N/A;  8:15am   DILATION AND CURETTAGE OF UTERUS     multiple   ESOPHAGOGASTRODUODENOSCOPY  06/14/2006   NUR: Normal esophagogastroduodenoscopy   ESOPHAGOGASTRODUODENOSCOPY  Feb 2011   Dr.  Jena Gauss: inflamed-appearing esophagus with overlying distal esophageal erosions superimposed on noncritical Schatzki ring, s/p dilation and biopsy. Benign path   ESOPHAGOGASTRODUODENOSCOPY N/A 12/08/2016   Dr. Jena Gauss: Tammy grade A esophagitis, mild schatzki's ring at the GE junction, small hiatal hernia, esophageal dilation performed. Prevacid increased to BID.   foot surgery     X 5-bone spurs and tendon release; morton's neuroma.   HAND SURGERY Right    X 2-carpal tunnel release and ganglion cyst removed.   HEMORRHOID BANDING  2016   Dr.Rourk   KNEE ARTHROSCOPY WITH LATERAL MENISECTOMY Left 03/27/2015   Procedure: KNEE ARTHROSCOPY WITH LATERAL MENISECTOMY;  Surgeon: Vickki Hearing, MD;  Location: [facility];  Service: Orthopedics;  Laterality: Left;   POLYPECTOMY  02/10/2022   Procedure: POLYPECTOMY;  Surgeon: Corbin Ade, MD;  Location: [facility];  Service:  Endoscopy;;   ROTATOR CUFF REPAIR Right     Family History  Problem Relation Age of Onset   Hypertension Mother    Aneurysm Mother    Diabetes Mother    Heart disease Father    Heart disease Son    Colon cancer Brother        age 84, deceased 2 weeks later    Allergies as of 04/28/2022 - Review Complete 04/28/2022  Allergen Reaction Noted   Codeine Other (See Comments)    Meperidine hcl Other (See Comments)     Social History   Socioeconomic History   Marital status: Divorced    Spouse name: Not on file   Number of children: Not on file   Years of education: Not on file   Highest education level: Not on file  Occupational History   Not on file  Tobacco Use   Smoking status: Never   Smokeless tobacco: Never   Tobacco comments:    Never smoked  Vaping Use   Vaping Use: Never used  Substance and Sexual Activity   Alcohol use: No   Drug use: No   Sexual activity: Yes    Birth control/protection: Surgical    Comment: hyst  Other Topics Concern   Not on file  Social History Narrative   Not on file   Social  Determinants of Health   Financial Resource Strain: Not on file  Food Insecurity: Not on file  Transportation Needs: Not on file  Physical Activity: Not on file  Stress: Not on file  Social Connections: Not on file     Review of Systems   Gen: Denies fever, chills, anorexia. Denies fatigue, weakness, weight loss.  CV: Denies chest pain, palpitations, syncope, peripheral edema, and claudication. Resp: Denies dyspnea at rest, cough, wheezing, coughing up blood, and pleurisy. GI: See HPI Derm: Denies rash, itching, dry skin Psych: Denies depression, anxiety, memory loss, confusion. No homicidal or suicidal ideation.  Heme: Denies bruising, bleeding, and enlarged lymph nodes.   Physical Exam   BP (!) 147/79 (BP Location: Right Arm, Patient Position: Sitting, Cuff Size: Normal)   Pulse 92   Temp 97.6 F (36.4 C) (Temporal)   Ht 5' 4.5" (1.638 m)   Wt 185 lb (83.9 kg)   SpO2 95%   BMI 31.26 kg/m   General:   Alert and oriented. No distress noted. Pleasant and cooperative.  Head:  Normocephalic and atraumatic. Eyes:  Conjuctiva clear without scleral icterus. Mouth:  Oral mucosa pink and moist. Good dentition. No lesions. Lungs:  Clear to auscultation bilaterally. No wheezes, rales, or rhonchi. No distress.  Heart:  S1, S2 present without murmurs appreciated.  Abdomen:  +BS, soft, non-tender and non-distended. No rebound or guarding. No HSM or masses noted. Rectal: *** Msk:  Symmetrical without gross deformities. Normal posture. Extremities:  Without edema. Neurologic:  Alert and  oriented x4 Psych:  Alert and cooperative. Normal mood and affect.   Assessment  Tammy Kramer is a 71 y.o. female with a history of GERD, constipation, Grade 3 hemorrhoids s/p banding in 2020*** presenting today for post procedure follow up to discuss repeat hemorrhoid banding.   Constipation:   GERD:   PLAN   ***     Brooke Bonito, MSN, FNP-BC, AGACNP-BC [facility]

## 2022-04-29 ENCOUNTER — Encounter: Payer: Self-pay | Admitting: Gastroenterology

## 2022-04-29 NOTE — Progress Notes (Signed)
See hemorrhoid banding note. Appointment type changed.

## 2022-04-30 DIAGNOSIS — E785 Hyperlipidemia, unspecified: Secondary | ICD-10-CM | POA: Diagnosis not present

## 2022-04-30 DIAGNOSIS — I10 Essential (primary) hypertension: Secondary | ICD-10-CM | POA: Diagnosis not present

## 2022-05-04 ENCOUNTER — Telehealth: Payer: Self-pay

## 2022-05-04 NOTE — Telephone Encounter (Signed)
Phoned and advised the pt of your instructions to continue Amitiza and drink plenty of water. Pt expressed understanding

## 2022-05-04 NOTE — Telephone Encounter (Signed)
Pt phoned advising that on yesterday after raking leaves and bagging up 3 bags she had back pain and noticed when she had a BM it was very little with bright red blood. Today just pressure, no bleeding. Has not taken her Amitiza yet because she just got out of the tub. Advised to do so and to eat. Pt did advise that she took 2 Amitiza yesterday and will do so today. Please advise.

## 2022-05-05 NOTE — Telephone Encounter (Signed)
Noted  

## 2022-05-19 ENCOUNTER — Encounter: Payer: Medicare Other | Admitting: Gastroenterology

## 2022-05-19 ENCOUNTER — Ambulatory Visit: Payer: Medicare Other | Admitting: Podiatry

## 2022-05-19 DIAGNOSIS — M6528 Calcific tendinitis, other site: Secondary | ICD-10-CM | POA: Diagnosis not present

## 2022-05-19 DIAGNOSIS — M62461 Contracture of muscle, right lower leg: Secondary | ICD-10-CM

## 2022-05-19 DIAGNOSIS — B351 Tinea unguium: Secondary | ICD-10-CM | POA: Diagnosis not present

## 2022-05-19 DIAGNOSIS — M7661 Achilles tendinitis, right leg: Secondary | ICD-10-CM

## 2022-05-19 MED ORDER — FLUCONAZOLE 150 MG PO TABS: 150.0000 mg | ORAL_TABLET | ORAL | 0 refills | Status: DC

## 2022-05-19 NOTE — Progress Notes (Signed)
  Subjective:  Patient ID: Tammy Kramer, female    DOB: Nov 05, 1950,  MRN: [id number]  Chief Complaint  Patient presents with   Nail Problem    Both great toenails are painful, especially at night   Gout    She has a long history of gout    Tendonitis    Right heel pain in the back of her heel - she has been told that she has "calcification" in that heel and is not sure what that means    71 y.o. female presents with the above complaint. History confirmed with patient.  She has a new problem with pain in the back of the heel.  She has changes in the color of her nails as well that are thick and the nails are starting causing discomfort, she previously had them removed with a grew back thick and brown discolored Objective:  Physical Exam: warm, good capillary refill, no trophic changes or ulcerative lesions, normal DP and PT pulses, and normal sensory exam. Left Foot: dystrophic yellowed discolored nail plates with subungual debris Right Foot: tenderness at Achilles tendon insertion, gastrocnemius equinus is noted with a positive silverskiold test, and dystrophic yellowed discolored nail plates with subungual debris  Previous right foot radiographs reviewed showed calcification of insertion of the Achilles tendon Assessment:   1. Calcific Achilles tendinitis of right lower extremity   2. Gastrocnemius equinus of right lower extremity   3. Onychomycosis      Plan:  Patient was evaluated and treated and all questions answered.  Discussed the etiology and treatment options for Achilles tendinitis including stretching, formal physical therapy with an eccentric exercises therapy plan, supportive shoegears such as a running shoe or sneaker, heel lifts, topical and oral medications.  We also discussed that I do not routinely perform injections in this area because of the risk of an increased damage or rupture of the tendon.  We also discussed the role of surgical treatment of this for  patients who do not improve after exhausting non-surgical treatment options.  -XR reviewed with patient -Educated on stretching and icing of the affected limb. -Referral placed to physical therapy. -Rx for Medrol 6-day taper. Advised on risks, benefits, and alternatives of the medication   Discussed treatment options for onychomycosis and its etiology.  I recommended pulsed dosing with fluconazole.  Rx sent to pharmacy.  Did not do terbinafine 90-day course due to possible interaction with her statin   Return in about 6 months (around 11/17/2022) for follow up after nail fungus treatment, re-check Achilles tendon.

## 2022-05-19 NOTE — Patient Instructions (Addendum)
Call [phone] to schedule physical therapy    Achilles Tendinitis  with Rehab Achilles tendinitis is a disorder of the Achilles tendon. The Achilles tendon connects the large calf muscles (Gastrocnemius and Soleus) to the heel bone (calcaneus). This tendon is sometimes called the heel cord. It is important for pushing-off and standing on your toes and is important for walking, running, or jumping. Tendinitis is often caused by overuse and repetitive microtrauma. SYMPTOMS Pain, tenderness, swelling, warmth, and redness may occur over the Achilles tendon even at rest. Pain with pushing off, or flexing or extending the ankle. Pain that is worsened after or during activity. CAUSES  Overuse sometimes seen with rapid increase in exercise programs or in sports requiring running and jumping. Poor physical conditioning (strength and flexibility or endurance). Running sports, especially training running down hills. Inadequate warm-up before practice or play or failure to stretch before participation. Injury to the tendon. PREVENTION  Warm up and stretch before practice or competition. Allow time for adequate rest and recovery between practices and competition. Keep up conditioning. Keep up ankle and leg flexibility. Improve or keep muscle strength and endurance. Improve cardiovascular fitness. Use proper technique. Use proper equipment (shoes, skates). To help prevent recurrence, taping, protective strapping, or an adhesive bandage may be recommended for several weeks after healing is complete. PROGNOSIS  Recovery may take weeks to several months to heal. Longer recovery is expected if symptoms have been prolonged. Recovery is usually quicker if the inflammation is due to a direct blow as compared with overuse or sudden strain. RELATED COMPLICATIONS  Healing time will be prolonged if the condition is not correctly treated. The injury must be given plenty of time to heal. Symptoms can  reoccur if activity is resumed too soon. Untreated, tendinitis may increase the risk of tendon rupture requiring additional time for recovery and possibly surgery. TREATMENT  The first treatment consists of rest anti-inflammatory medication, and ice to relieve the pain. Stretching and strengthening exercises after resolution of pain will likely help reduce the risk of recurrence. Referral to a physical therapist or athletic trainer for further evaluation and treatment may be helpful. A walking boot or cast may be recommended to rest the Achilles tendon. This can help break the cycle of inflammation and microtrauma. Arch supports (orthotics) may be prescribed or recommended by your caregiver as an adjunct to therapy and rest. Surgery to remove the inflamed tendon lining or degenerated tendon tissue is rarely necessary and has shown less than predictable results. MEDICATION  Nonsteroidal anti-inflammatory medications, such as aspirin and ibuprofen, may be used for pain and inflammation relief. Do not take within 7 days before surgery. Take these as directed by your caregiver. Contact your caregiver immediately if any bleeding, stomach upset, or signs of allergic reaction occur. Other minor pain relievers, such as acetaminophen, may also be used. Pain relievers may be prescribed as necessary by your caregiver. Do not take prescription pain medication for longer than 4 to 7 days. Use only as directed and only as much as you need. Cortisone injections are rarely indicated. Cortisone injections may weaken tendons and predispose to rupture. It is better to give the condition more time to heal than to use them. HEAT AND COLD Cold is used to relieve pain and reduce inflammation for acute and chronic Achilles tendinitis. Cold should be applied for 10 to 15 minutes every 2 to 3 hours for inflammation and pain and immediately after any activity that aggravates your symptoms. Use ice packs or  an ice massage. Heat  may be used before performing stretching and strengthening activities prescribed by your caregiver. Use a heat pack or a warm soak. SEEK MEDICAL CARE IF: Symptoms get worse or do not improve in 2 weeks despite treatment. New, unexplained symptoms develop. Drugs used in treatment may produce side effects.  EXERCISES:  RANGE OF MOTION (ROM) AND STRETCHING EXERCISES - Achilles Tendinitis  These exercises may help you when beginning to rehabilitate your injury. Your symptoms may resolve with or without further involvement from your physician, physical therapist or athletic trainer. While completing these exercises, remember:  Restoring tissue flexibility helps normal motion to return to the joints. This allows healthier, less painful movement and activity. An effective stretch should be held for at least 30 seconds. A stretch should never be painful. You should only feel a gentle lengthening or release in the stretched tissue.  STRETCH  Gastroc, Standing  Place hands on wall. Extend right / left leg, keeping the front knee somewhat bent. Slightly point your toes inward on your back foot. Keeping your right / left heel on the floor and your knee straight, shift your weight toward the wall, not allowing your back to arch. You should feel a gentle stretch in the right / left calf. Hold this position for 10 seconds. Repeat 3 times. Complete this stretch 2 times per day.  STRETCH  Soleus, Standing  Place hands on wall. Extend right / left leg, keeping the other knee somewhat bent. Slightly point your toes inward on your back foot. Keep your right / left heel on the floor, bend your back knee, and slightly shift your weight over the back leg so that you feel a gentle stretch deep in your back calf. Hold this position for 10 seconds. Repeat 3 times. Complete this stretch 2 times per day.  STRETCH  Gastrocsoleus, Standing  Note: This exercise can place a lot of stress on your foot and ankle. Please  complete this exercise only if specifically instructed by your caregiver.  Place the ball of your right / left foot on a step, keeping your other foot firmly on the same step. Hold on to the wall or a rail for balance. Slowly lift your other foot, allowing your body weight to press your heel down over the edge of the step. You should feel a stretch in your right / left calf. Hold this position for 10 seconds. Repeat this exercise with a slight bend in your knee. Repeat 3 times. Complete this stretch 2 times per day.   STRENGTHENING EXERCISES - Achilles Tendinitis These exercises may help you when beginning to rehabilitate your injury. They may resolve your symptoms with or without further involvement from your physician, physical therapist or athletic trainer. While completing these exercises, remember:  Muscles can gain both the endurance and the strength needed for everyday activities through controlled exercises. Complete these exercises as instructed by your physician, physical therapist or athletic trainer. Progress the resistance and repetitions only as guided. You may experience muscle soreness or fatigue, but the pain or discomfort you are trying to eliminate should never worsen during these exercises. If this pain does worsen, stop and make certain you are following the directions exactly. If the pain is still present after adjustments, discontinue the exercise until you can discuss the trouble with your clinician.  STRENGTH - Plantar-flexors  Sit with your right / left leg extended. Holding onto both ends of a rubber exercise band/tubing, loop it around the ball of  your foot. Keep a slight tension in the band. Slowly push your toes away from you, pointing them downward. Hold this position for 10 seconds. Return slowly, controlling the tension in the band/tubing. Repeat 3 times. Complete this exercise 2 times per day.   STRENGTH - Plantar-flexors  Stand with your feet shoulder width  apart. Steady yourself with a wall or table using as little support as needed. Keeping your weight evenly spread over the width of your feet, rise up on your toes.* Hold this position for 10 seconds. Repeat 3 times. Complete this exercise 2 times per day.  *If this is too easy, shift your weight toward your right / left leg until you feel challenged. Ultimately, you may be asked to do this exercise with your right / left foot only.  STRENGTH  Plantar-flexors, Eccentric  Note: This exercise can place a lot of stress on your foot and ankle. Please complete this exercise only if specifically instructed by your caregiver.  Place the balls of your feet on a step. With your hands, use only enough support from a wall or rail to keep your balance. Keep your knees straight and rise up on your toes. Slowly shift your weight entirely to your right / left toes and pick up your opposite foot. Gently and with controlled movement, lower your weight through your right / left foot so that your heel drops below the level of the step. You will feel a slight stretch in the back of your calf at the end position. Use the healthy leg to help rise up onto the balls of both feet, then lower weight only on the right / left leg again. Build up to 15 repetitions. Then progress to 3 consecutive sets of 15 repetitions.* After completing the above exercise, complete the same exercise with a slight knee bend (about 30 degrees). Again, build up to 15 repetitions. Then progress to 3 consecutive sets of 15 repetitions.* Perform this exercise 2 times per day.  *When you easily complete 3 sets of 15, your physician, physical therapist or athletic trainer may advise you to add resistance by wearing a backpack filled with additional weight.  STRENGTH - Plantar Flexors, Seated  Sit on a chair that allows your feet to rest flat on the ground. If necessary, sit at the edge of the chair. Keeping your toes firmly on the ground, lift your  right / left heel as far as you can without increasing any discomfort in your ankle. Repeat 3 times. Complete this exercise 2 times a day.

## 2022-05-20 DIAGNOSIS — B351 Tinea unguium: Secondary | ICD-10-CM | POA: Diagnosis not present

## 2022-05-21 ENCOUNTER — Other Ambulatory Visit: Payer: Self-pay | Admitting: Gastroenterology

## 2022-05-21 LAB — HEPATIC FUNCTION PANEL
ALT: 20 IU/L (ref 0–32)
AST: 18 IU/L (ref 0–40)
Albumin: 4.8 g/dL (ref 3.8–4.8)
Alkaline Phosphatase: 104 IU/L (ref 44–121)
Bilirubin Total: 0.4 mg/dL (ref 0.0–1.2)
Bilirubin, Direct: 0.14 mg/dL (ref 0.00–0.40)
Total Protein: 7.9 g/dL (ref 6.0–8.5)

## 2022-05-26 ENCOUNTER — Ambulatory Visit: Payer: Medicare Other | Admitting: Gastroenterology

## 2022-05-26 ENCOUNTER — Encounter: Payer: Self-pay | Admitting: Gastroenterology

## 2022-05-26 VITALS — BP 145/77 | HR 94 | Temp 97.1°F | Ht 64.5 in | Wt 183.2 lb

## 2022-05-26 DIAGNOSIS — K641 Second degree hemorrhoids: Secondary | ICD-10-CM

## 2022-05-26 NOTE — Patient Instructions (Signed)
  Please avoid straining.  You should limit your toilet time to 2-3 minutes at the most.   I recommend Benefiber 2 teaspoons each morning in the beverage of your choice! Let's try adding Miralax every other day to daily.   Please call me with any concerns or issues!  I will see you in follow-up for additional banding in several weeks.  Have a great Christmas!  I enjoyed seeing you again today! As you know, I value our relationship and want to provide genuine, compassionate, and quality care. I welcome your feedback. If you receive a survey regarding your visit,  I greatly appreciate you taking time to fill this out. See you next time!  Gelene Mink, PhD, ANP-BC [facility]

## 2022-05-26 NOTE — Progress Notes (Signed)
       [facility] BANDING PROCEDURE NOTE  Tammy Kramer is a 71 y.o. female presenting today for consideration of hemorrhoid banding. Last colonoscopy Aug 2023 with hyperplastic polyp, non-bleeding internal hemorrhoids. Banding previously in 2020. Right posterior internal hemorrhoid at last visit.   The patient presents with symptomatic grade 2 hemorrhoids, unresponsive to maximal medical therapy, requesting rubber band ligation of her hemorrhoidal disease. All risks, benefits, and alternative forms of therapy were described and informed consent was obtained.  The decision was made to band the right anterior internal hemorrhoid, and insufficient tissue was obtained. I then placed neutrally, and the [facility] O'Regan System was used to perform band ligation without complication. Appeared to be in right anterior position. Digital anorectal examination was then performed to assure proper positioning of the band, and to adjust the banded tissue as required. The patient was discharged home without pain or other issues. Dietary and behavioral recommendations were given, along with follow-up instructions. The patient will return in several weeks for followup and possible additional banding as required.  No complications were encountered and the patient tolerated the procedure well.   Gelene Mink, PhD, ANP-BC [facility]

## 2022-05-30 DIAGNOSIS — E785 Hyperlipidemia, unspecified: Secondary | ICD-10-CM | POA: Diagnosis not present

## 2022-05-30 DIAGNOSIS — I10 Essential (primary) hypertension: Secondary | ICD-10-CM | POA: Diagnosis not present

## 2022-06-16 ENCOUNTER — Encounter: Payer: Self-pay | Admitting: Gastroenterology

## 2022-06-16 ENCOUNTER — Ambulatory Visit: Payer: Medicare Other | Admitting: Gastroenterology

## 2022-06-16 VITALS — BP 118/81 | HR 98 | Temp 97.9°F | Ht 64.5 in | Wt 183.0 lb

## 2022-06-16 DIAGNOSIS — K641 Second degree hemorrhoids: Secondary | ICD-10-CM | POA: Diagnosis not present

## 2022-06-16 NOTE — Patient Instructions (Signed)
  Please avoid straining.  You should limit your toilet time to 2-3 minutes at the most.   I recommend Benefiber 2 teaspoons each morning in the beverage of your choice!  Please call me with any concerns or issues!  I will see you in follow-up in 2 weeks!  I enjoyed seeing you again today! As you know, I value our relationship and want to provide genuine, compassionate, and quality care. I welcome your feedback. If you receive a survey regarding your visit,  I greatly appreciate you taking time to fill this out. See you next time!  Gelene Mink, PhD, ANP-BC [facility]

## 2022-06-16 NOTE — Progress Notes (Signed)
    [facility] BANDING PROCEDURE NOTE  COLEEN CARDIFF is a 71 y.o. female presenting today for consideration of hemorrhoid banding. Last colonoscopy  Aug 2023 with hyperplastic polyp, non-bleeding internal hemorrhoids. Banding previously in 2020. Right posterior internal hemorrhoid and right anterior completed thus far. Bleeding and pressure improved.    The patient presents with symptomatic grade 2 hemorrhoids, unresponsive to maximal medical therapy, requesting rubber band ligation of her hemorrhoidal disease. All risks, benefits, and alternative forms of therapy were described and informed consent was obtained.   The decision was made to band the left lateral internal hemorrhoid, and the [facility] O'Regan System was used to perform band ligation without complication. Digital anorectal examination was then performed to assure proper positioning of the band, and to adjust the banded tissue as required. The patient was discharged home without pain or other issues. Dietary and behavioral recommendations were given, along with follow-up instructions. The patient will return in several weeks for followup and possible additional banding as required. Will do anoscopy at next visit. May need neutral banding.   No complications were encountered and the patient tolerated the procedure well.   Gelene Mink, PhD, ANP-BC [facility]
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
## 2022-06-21 ENCOUNTER — Ambulatory Visit (HOSPITAL_COMMUNITY): Payer: Medicare Other | Attending: Internal Medicine

## 2022-06-21 DIAGNOSIS — M6788 Other specified disorders of synovium and tendon, other site: Secondary | ICD-10-CM | POA: Insufficient documentation

## 2022-06-21 DIAGNOSIS — M62461 Contracture of muscle, right lower leg: Secondary | ICD-10-CM | POA: Insufficient documentation

## 2022-06-21 DIAGNOSIS — M7661 Achilles tendinitis, right leg: Secondary | ICD-10-CM | POA: Diagnosis not present

## 2022-06-21 DIAGNOSIS — R262 Difficulty in walking, not elsewhere classified: Secondary | ICD-10-CM | POA: Insufficient documentation

## 2022-06-21 DIAGNOSIS — M6528 Calcific tendinitis, other site: Secondary | ICD-10-CM | POA: Insufficient documentation

## 2022-06-27 ENCOUNTER — Ambulatory Visit (HOSPITAL_COMMUNITY): Payer: Medicare Other

## 2022-06-27 DIAGNOSIS — M62461 Contracture of muscle, right lower leg: Secondary | ICD-10-CM | POA: Diagnosis not present

## 2022-06-27 DIAGNOSIS — M6788 Other specified disorders of synovium and tendon, other site: Secondary | ICD-10-CM

## 2022-06-27 DIAGNOSIS — M6528 Calcific tendinitis, other site: Secondary | ICD-10-CM | POA: Diagnosis not present

## 2022-06-27 DIAGNOSIS — M7661 Achilles tendinitis, right leg: Secondary | ICD-10-CM

## 2022-06-27 DIAGNOSIS — R262 Difficulty in walking, not elsewhere classified: Secondary | ICD-10-CM
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
## 2022-06-27 NOTE — Patient Instructions (Signed)

## 2022-06-29 DIAGNOSIS — J309 Allergic rhinitis, unspecified: Secondary | ICD-10-CM | POA: Diagnosis not present

## 2022-06-29 DIAGNOSIS — K219 Gastro-esophageal reflux disease without esophagitis: Secondary | ICD-10-CM | POA: Diagnosis not present

## 2022-06-29 DIAGNOSIS — N1832 Chronic kidney disease, stage 3b: Secondary | ICD-10-CM | POA: Diagnosis not present

## 2022-06-29 DIAGNOSIS — I10 Essential (primary) hypertension: Secondary | ICD-10-CM | POA: Diagnosis not present

## 2022-06-29 DIAGNOSIS — K5904 Chronic idiopathic constipation: Secondary | ICD-10-CM | POA: Diagnosis not present

## 2022-06-29 DIAGNOSIS — Z0001 Encounter for general adult medical examination with abnormal findings: Secondary | ICD-10-CM | POA: Diagnosis not present

## 2022-06-29 DIAGNOSIS — M109 Gout, unspecified: Secondary | ICD-10-CM | POA: Diagnosis not present

## 2022-06-29 DIAGNOSIS — E785 Hyperlipidemia, unspecified: Secondary | ICD-10-CM | POA: Diagnosis not present

## 2022-06-29 DIAGNOSIS — Z1389 Encounter for screening for other disorder: Secondary | ICD-10-CM | POA: Diagnosis not present

## 2022-07-06 ENCOUNTER — Ambulatory Visit (HOSPITAL_COMMUNITY): Payer: Medicare Other | Admitting: Physical Therapy

## 2022-07-06 DIAGNOSIS — M6528 Calcific tendinitis, other site: Secondary | ICD-10-CM | POA: Diagnosis not present

## 2022-07-06 DIAGNOSIS — M6788 Other specified disorders of synovium and tendon, other site: Secondary | ICD-10-CM

## 2022-07-06 DIAGNOSIS — R262 Difficulty in walking, not elsewhere classified: Secondary | ICD-10-CM

## 2022-07-06 DIAGNOSIS — M7661 Achilles tendinitis, right leg: Secondary | ICD-10-CM | POA: Diagnosis not present

## 2022-07-06 DIAGNOSIS — M62461 Contracture of muscle, right lower leg: Secondary | ICD-10-CM | POA: Diagnosis not present
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
## 2022-07-12 ENCOUNTER — Ambulatory Visit (HOSPITAL_COMMUNITY): Payer: Medicare Other

## 2022-07-12 DIAGNOSIS — M62461 Contracture of muscle, right lower leg: Secondary | ICD-10-CM | POA: Diagnosis not present

## 2022-07-12 DIAGNOSIS — R262 Difficulty in walking, not elsewhere classified: Secondary | ICD-10-CM | POA: Diagnosis not present

## 2022-07-12 DIAGNOSIS — M6788 Other specified disorders of synovium and tendon, other site: Secondary | ICD-10-CM

## 2022-07-12 DIAGNOSIS — M7661 Achilles tendinitis, right leg: Secondary | ICD-10-CM

## 2022-07-12 DIAGNOSIS — M6528 Calcific tendinitis, other site: Secondary | ICD-10-CM | POA: Diagnosis not present
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
## 2022-07-14 ENCOUNTER — Ambulatory Visit: Payer: Medicare Other | Admitting: Gastroenterology

## 2022-07-14 ENCOUNTER — Encounter: Payer: Self-pay | Admitting: Gastroenterology

## 2022-07-14 VITALS — BP 150/85 | HR 120 | Temp 97.4°F | Ht 64.5 in | Wt 183.4 lb

## 2022-07-14 DIAGNOSIS — I10 Essential (primary) hypertension: Secondary | ICD-10-CM | POA: Diagnosis not present

## 2022-07-14 DIAGNOSIS — K642 Third degree hemorrhoids: Secondary | ICD-10-CM

## 2022-07-14 NOTE — Patient Instructions (Signed)
  Please avoid straining.  You should limit your toilet time to 2-3 minutes at the most.   Continue Metamucil as you are doing  Please call me with any concerns or issues!  I will see you in 6 months!  I enjoyed seeing you again today! At our first visit, I mentioned how I value our relationship and want to provide genuine, compassionate, and quality care. You may receive a survey regarding your visit with me, and I welcome your feedback! Thanks so much for taking the time to complete this. I look forward to seeing you again.   Annitta Needs, PhD, ANP-BC [facility] Gastroenterology

## 2022-07-14 NOTE — Progress Notes (Signed)
Gastroenterology Office Note     Primary Care Physician:  Benetta Spar, MD  Primary Gastroenterologist: Dr. Jena Gauss    Chief Complaint   Chief Complaint  Patient presents with   Hemorrhoids    Pt here for a banding/ not sure     History of Present Illness   Tammy Kramer is a 72 y.o. female presenting today in follow-up with a history of Grade 3 hemorrhoids.    Last colonoscopy Aug 2023 with hyperplastic polyp, non-bleeding internal hemorrhoids. Banding previously in 2020. Right posterior internal hemorrhoid, right anterior, and left lateral have been completed. no bleeding. No prolapsing tissue. Taking metamucil daily. BM daily. Soft. No leaking/soiling.   Here to discuss if further banding needed.     Past Medical History:  Diagnosis Date   Arthritis    Back pain    Chronic constipation    Complication of anesthesia    difficulty waking up from anesthesia   GERD (gastroesophageal reflux disease)    Hemorrhoids    Hypercholesterolemia    Hypertension    Seasonal allergies     Past Surgical History:  Procedure Laterality Date   ABDOMINAL HYSTERECTOMY     BACK SURGERY     C4-C5 fusion     CATARACT EXTRACTION W/PHACO Right 05/02/2017   Procedure: CATARACT EXTRACTION PHACO AND INTRAOCULAR LENS PLACEMENT (IOC);  Surgeon: Jethro Bolus, MD;  [facility]: [facility];  Service: Ophthalmology;  Laterality: Right;  CDE: 5.99   CATARACT EXTRACTION W/PHACO Left 05/16/2017   Procedure: CATARACT EXTRACTION PHACO AND INTRAOCULAR LENS PLACEMENT (IOC);  Surgeon: Jethro Bolus, MD;  Location: [facility];  Service: Ophthalmology;  Laterality: Left;  CDE: 3.80   cholecystectomy     CHOLECYSTECTOMY     COLONOSCOPY   10/16/2006   NUR: 3 mm polyp ablated via cold biopsy from the splenic flexure.External hemorrhoids, hyperplastic polyp   COLONOSCOPY  Feb 2011   Dr. Jena Gauss: internal hemorrhoids, otherwise normal.    COLONOSCOPY N/A 04/08/2014   RMR: Internal  hemorrhoids; colonic diverticulosis( few  as described above) hematochezia likely secondary to hemorrhoids in the setting of constipation.   COLONOSCOPY N/A 12/08/2016   Dr. Jena Gauss: none bleeding internal hemorrhoids which were small. Next colonoscopy in five years.   COLONOSCOPY WITH PROPOFOL N/A 02/10/2022   Procedure: COLONOSCOPY WITH PROPOFOL;  Surgeon: Corbin Ade, MD;  [facility];  Service: Endoscopy;  Laterality: N/A;  8:15am   DILATION AND CURETTAGE OF UTERUS     multiple   ESOPHAGOGASTRODUODENOSCOPY  06/14/2006   NUR: Normal esophagogastroduodenoscopy   ESOPHAGOGASTRODUODENOSCOPY  Feb 2011   Dr. Jena Gauss: inflamed-appearing esophagus with overlying distal esophageal erosions superimposed on noncritical Schatzki ring, s/p dilation and biopsy. Benign path   ESOPHAGOGASTRODUODENOSCOPY N/A 12/08/2016   Dr. Jena Gauss: LA grade A esophagitis, mild schatzki's ring at the GE junction, small hiatal hernia, esophageal dilation performed. Prevacid increased to BID.   foot surgery     X 5-bone spurs and tendon release; morton's neuroma.   HAND SURGERY Right    X 2-carpal tunnel release and ganglion cyst removed.   HEMORRHOID BANDING  2016   Dr.Rourk   KNEE ARTHROSCOPY WITH LATERAL MENISECTOMY Left 03/27/2015   Procedure: KNEE ARTHROSCOPY WITH LATERAL MENISECTOMY;  Surgeon: Vickki Hearing, MD;  Location: [facility];  Service: Orthopedics;  Laterality: Left;   POLYPECTOMY  02/10/2022   Procedure: POLYPECTOMY;  Surgeon: Corbin Ade, MD;  [facility];  Service: Endoscopy;;  ROTATOR CUFF REPAIR Right     Current Outpatient Medications  Medication Sig Dispense Refill   acetaminophen (TYLENOL) 325 MG tablet Take 650 mg by mouth every 6 (six) hours as needed for moderate pain.     allopurinol (ZYLOPRIM) 100 MG tablet Take 100 mg by mouth daily.     atorvastatin (LIPITOR) 40 MG tablet Take 40 mg by mouth daily.      cycloSPORINE (RESTASIS) 0.05 % ophthalmic emulsion Place 1  drop into both eyes 2 (two) times daily as needed (dry eyes).     diclofenac (VOLTAREN) 75 MG EC tablet Take 1 tablet (75 mg total) by mouth at bedtime. (Patient taking differently: Take 75 mg by mouth daily as needed (knee pain).) 30 tablet 2   fluticasone (FLONASE) 50 MCG/ACT nasal spray Place 1 spray into both nostrils daily as needed for allergies.  3   hydrocortisone (PROCTOZONE-HC) 2.5 % rectal cream Place 1 application rectally 2 (two) times daily. 30 g 3   ibuprofen (ADVIL) 800 MG tablet Take 800 mg by mouth 2 (two) times daily as needed for moderate pain.     ibuprofen (ADVIL,MOTRIN) 200 MG tablet Take 200 mg by mouth every 8 (eight) hours as needed (for pain).      lansoprazole (PREVACID) 15 MG capsule Take 15 mg by mouth daily.     Lidocaine HCl (ASPERCREME LIDOCAINE) 4 % CREA Apply 1 Application topically daily as needed (pain).     loratadine (CLARITIN) 10 MG tablet Take 10 mg by mouth daily.     METAMUCIL FIBER PO Take 1 Scoop by mouth every other day.     sucralfate (CARAFATE) 1 g tablet Take 1 tablet (1 g total) by mouth 4 (four) times daily -  with meals and at bedtime. 21 tablet 0   TURMERIC PO Take 1 capsule by mouth daily as needed (inflammation).     valsartan-hydrochlorothiazide (DIOVAN-HCT) 160-25 MG tablet Take 1 tablet by mouth daily.     fluconazole (DIFLUCAN) 150 MG tablet Take 1 tablet (150 mg total) by mouth once a week. (Patient not taking: Reported on 07/14/2022) 26 tablet 0   naproxen (NAPROSYN) 500 MG tablet Take 500 mg by mouth 2 (two) times daily as needed for moderate pain. (Patient not taking: Reported on 07/14/2022)     polyethylene glycol powder (GLYCOLAX/MIRALAX) 17 GM/SCOOP powder Take 1 Container by mouth once. (Patient not taking: Reported on 07/14/2022)     No current facility-administered medications for this visit.    Allergies as of 07/14/2022 - Review Complete 07/14/2022  Allergen Reaction Noted   Codeine Other (See Comments)    Meperidine hcl Other  (See Comments)     Family History  Problem Relation Age of Onset   Hypertension Mother    Aneurysm Mother    Diabetes Mother    Heart disease Father    Heart disease Son    Colon cancer Brother        age 15, deceased 2 weeks later    Social History   Socioeconomic History   Marital status: Divorced    Spouse name: Not on file   Number of children: Not on file   Years of education: Not on file   Highest education level: Not on file  Occupational History   Not on file  Tobacco Use   Smoking status: Never   Smokeless tobacco: Never   Tobacco comments:    Never smoked  Vaping Use   Vaping Use: Never used  Substance  and Sexual Activity   Alcohol use: No   Drug use: No   Sexual activity: Yes    Birth control/protection: Surgical    Comment: hyst  Other Topics Concern   Not on file  Social History Narrative   Not on file   Social Determinants of Health   Financial Resource Strain: Not on file  Food Insecurity: Not on file  Transportation Needs: Not on file  Physical Activity: Not on file  Stress: Not on file  Social Connections: Not on file  Intimate Partner Violence: Not on file     Review of Systems   Gen: Denies any fever, chills, fatigue, weight loss, lack of appetite.  CV: Denies chest pain, heart palpitations, peripheral edema, syncope.  Resp: Denies shortness of breath at rest or with exertion. Denies wheezing or cough.  GI: Denies dysphagia or odynophagia. Denies jaundice, hematemesis, fecal incontinence. GU : Denies urinary burning, urinary frequency, urinary hesitancy MS: Denies joint pain, muscle weakness, cramps, or limitation of movement.  Derm: Denies rash, itching, dry skin Psych: Denies depression, anxiety, memory loss, and confusion Heme: Denies bruising, bleeding, and enlarged lymph nodes.   Physical Exam   BP (!) 150/85   Pulse (!) 120   Temp (!) 97.4 F (36.3 C)   Ht 5' 4.5" (1.638 m)   Wt 183 lb 6.4 oz (83.2 kg)   BMI 30.99  kg/m  General:   Alert and oriented. Pleasant and cooperative. Well-nourished and well-developed.  Head:  Normocephalic and atraumatic. Eyes:  Without icterus Rectal:  external hemorrhoid skin tags, DRE without mass. Anoscopy with resolution of hemorrhoids.  Msk:  Symmetrical without gross deformities. Normal posture. Extremities:  Without edema. Neurologic:  Alert and  oriented x4;  grossly normal neurologically. Skin:  Intact without significant lesions or rashes. Psych:  Alert and cooperative. Normal mood and affect.   Assessment   Tammy Kramer is a 72 y.o. female presenting today in follow-up with a history of symptomatic hemorrhoids s/p recent banding. Anoscopy today with resolution of hemorrhoids.  She will continue supplemental fiber as she is doing with good results. Will see her back in 6 months.    PLAN   Fiber daily Avoid straining 6 month return   Annitta Needs, PhD, [facility] Gastroenterology

## 2022-07-18 ENCOUNTER — Encounter (HOSPITAL_COMMUNITY): Payer: Medicare Other | Admitting: Physical Therapy

## 2022-07-30 DIAGNOSIS — I10 Essential (primary) hypertension: Secondary | ICD-10-CM | POA: Diagnosis not present

## 2022-07-30 DIAGNOSIS — E785 Hyperlipidemia, unspecified: Secondary | ICD-10-CM | POA: Diagnosis not present

## 2022-08-18 ENCOUNTER — Encounter: Payer: Self-pay | Admitting: Radiology

## 2022-08-28 DIAGNOSIS — E785 Hyperlipidemia, unspecified: Secondary | ICD-10-CM | POA: Diagnosis not present

## 2022-08-28 DIAGNOSIS — I10 Essential (primary) hypertension: Secondary | ICD-10-CM | POA: Diagnosis not present

## 2022-09-28 DIAGNOSIS — I10 Essential (primary) hypertension: Secondary | ICD-10-CM | POA: Diagnosis not present

## 2022-09-28 DIAGNOSIS — E785 Hyperlipidemia, unspecified: Secondary | ICD-10-CM | POA: Diagnosis not present

## 2022-10-06 ENCOUNTER — Ambulatory Visit: Payer: Medicare Other | Admitting: Orthopedic Surgery

## 2022-10-06 ENCOUNTER — Encounter: Payer: Self-pay | Admitting: Orthopedic Surgery

## 2022-10-06 VITALS — BP 142/80 | HR 76 | Ht 64.5 in | Wt 182.0 lb

## 2022-10-06 DIAGNOSIS — M65342 Trigger finger, left ring finger: Secondary | ICD-10-CM

## 2022-10-06 NOTE — Progress Notes (Signed)
Chief Complaint  Patient presents with   Hand Pain    Left ring finger catching   Tammy Kramer is complaining of catching in the left ring finger however she treated herself with some exercises and Aspercreme and now its better with only occasional catching   Physical Exam Musculoskeletal:     Comments: Left ring finger  Tenderness over the A1 pulley of the left ring finger she has mild catching only noticeable if she closes the hand and opens it slowly.  The flexor and extensor tendons are intact the neurovascular exam is normal the color and capillary refill are excellent    Encounter Diagnosis  Name Primary?   Trigger finger, left ring finger Yes    I applied a finger splint to keep the finger straight she will wear that for 2 weeks and then if she still continues to trigger we can always place an injection

## 2022-10-28 DIAGNOSIS — I10 Essential (primary) hypertension: Secondary | ICD-10-CM | POA: Diagnosis not present

## 2022-10-28 DIAGNOSIS — E785 Hyperlipidemia, unspecified: Secondary | ICD-10-CM | POA: Diagnosis not present

## 2022-11-16 DIAGNOSIS — H04123 Dry eye syndrome of bilateral lacrimal glands: Secondary | ICD-10-CM | POA: Diagnosis not present

## 2022-11-17 ENCOUNTER — Ambulatory Visit: Payer: Medicare Other | Admitting: Podiatry

## 2022-11-24 ENCOUNTER — Encounter: Payer: Self-pay | Admitting: Gastroenterology

## 2022-11-28 DIAGNOSIS — I10 Essential (primary) hypertension: Secondary | ICD-10-CM | POA: Diagnosis not present

## 2022-11-28 DIAGNOSIS — E785 Hyperlipidemia, unspecified: Secondary | ICD-10-CM | POA: Diagnosis not present

## 2022-12-19 DIAGNOSIS — K219 Gastro-esophageal reflux disease without esophagitis: Secondary | ICD-10-CM | POA: Diagnosis not present

## 2022-12-19 DIAGNOSIS — I10 Essential (primary) hypertension: Secondary | ICD-10-CM | POA: Diagnosis not present

## 2022-12-19 DIAGNOSIS — E785 Hyperlipidemia, unspecified: Secondary | ICD-10-CM | POA: Diagnosis not present

## 2022-12-26 ENCOUNTER — Telehealth: Payer: Self-pay | Admitting: Gastroenterology

## 2022-12-26 ENCOUNTER — Encounter: Payer: Self-pay | Admitting: Gastroenterology

## 2022-12-26 NOTE — Telephone Encounter (Signed)
Patient called and said she received the recall letter but she has been out of town keeping her grandchildren.  She does want to make an appt for mid-Sept.  She said that the reflux medication sometimes she has to take 1 after dinner because the reflux is so bad.  Her hemorrhoids are ok she says.

## 2023-01-19 DIAGNOSIS — I10 Essential (primary) hypertension: Secondary | ICD-10-CM | POA: Diagnosis not present

## 2023-01-19 DIAGNOSIS — E785 Hyperlipidemia, unspecified: Secondary | ICD-10-CM | POA: Diagnosis not present

## 2023-02-19 DIAGNOSIS — I10 Essential (primary) hypertension: Secondary | ICD-10-CM | POA: Diagnosis not present

## 2023-02-19 DIAGNOSIS — E785 Hyperlipidemia, unspecified: Secondary | ICD-10-CM | POA: Diagnosis not present

## 2023-02-27 ENCOUNTER — Other Ambulatory Visit (HOSPITAL_COMMUNITY): Payer: Self-pay | Admitting: Internal Medicine

## 2023-02-27 DIAGNOSIS — Z1231 Encounter for screening mammogram for malignant neoplasm of breast: Secondary | ICD-10-CM

## 2023-03-15 ENCOUNTER — Ambulatory Visit (HOSPITAL_COMMUNITY)
Admission: RE | Admit: 2023-03-15 | Discharge: 2023-03-15 | Disposition: A | Discharge status: Home / Self Care | Payer: Medicare Other | Source: Ambulatory Visit | Attending: Internal Medicine | Admitting: Internal Medicine

## 2023-03-15 DIAGNOSIS — Z1231 Encounter for screening mammogram for malignant neoplasm of breast: Secondary | ICD-10-CM | POA: Diagnosis not present

## 2023-03-21 ENCOUNTER — Ambulatory Visit: Payer: Medicare Other | Admitting: Gastroenterology

## 2023-03-21 DIAGNOSIS — E785 Hyperlipidemia, unspecified: Secondary | ICD-10-CM | POA: Diagnosis not present

## 2023-03-21 DIAGNOSIS — I10 Essential (primary) hypertension: Secondary | ICD-10-CM | POA: Diagnosis not present

## 2023-04-21 DIAGNOSIS — I10 Essential (primary) hypertension: Secondary | ICD-10-CM | POA: Diagnosis not present

## 2023-04-21 DIAGNOSIS — E785 Hyperlipidemia, unspecified: Secondary | ICD-10-CM | POA: Diagnosis not present

## 2023-05-22 DIAGNOSIS — I10 Essential (primary) hypertension: Secondary | ICD-10-CM | POA: Diagnosis not present

## 2023-05-22 DIAGNOSIS — K219 Gastro-esophageal reflux disease without esophagitis: Secondary | ICD-10-CM | POA: Diagnosis not present

## 2023-05-22 DIAGNOSIS — E785 Hyperlipidemia, unspecified: Secondary | ICD-10-CM | POA: Diagnosis not present

## 2023-06-30 DIAGNOSIS — Z0001 Encounter for general adult medical examination with abnormal findings: Secondary | ICD-10-CM | POA: Diagnosis not present

## 2023-06-30 DIAGNOSIS — E785 Hyperlipidemia, unspecified: Secondary | ICD-10-CM | POA: Diagnosis not present

## 2023-06-30 DIAGNOSIS — I10 Essential (primary) hypertension: Secondary | ICD-10-CM | POA: Diagnosis not present

## 2023-06-30 DIAGNOSIS — M109 Gout, unspecified: Secondary | ICD-10-CM | POA: Diagnosis not present

## 2023-06-30 DIAGNOSIS — J309 Allergic rhinitis, unspecified: Secondary | ICD-10-CM | POA: Diagnosis not present

## 2023-06-30 DIAGNOSIS — Z1389 Encounter for screening for other disorder: Secondary | ICD-10-CM | POA: Diagnosis not present

## 2023-06-30 DIAGNOSIS — K219 Gastro-esophageal reflux disease without esophagitis: Secondary | ICD-10-CM | POA: Diagnosis not present

## 2023-07-06 DIAGNOSIS — I10 Essential (primary) hypertension: Secondary | ICD-10-CM | POA: Diagnosis not present

## 2023-07-06 DIAGNOSIS — Z0001 Encounter for general adult medical examination with abnormal findings: Secondary | ICD-10-CM | POA: Diagnosis not present

## 2023-07-06 DIAGNOSIS — E785 Hyperlipidemia, unspecified: Secondary | ICD-10-CM | POA: Diagnosis not present

## 2023-07-06 DIAGNOSIS — Z1159 Encounter for screening for other viral diseases: Secondary | ICD-10-CM | POA: Diagnosis not present

## 2023-07-20 ENCOUNTER — Encounter: Payer: Self-pay | Admitting: Gastroenterology

## 2023-07-20 ENCOUNTER — Ambulatory Visit (INDEPENDENT_AMBULATORY_CARE_PROVIDER_SITE_OTHER): Payer: Medicare Other | Admitting: Gastroenterology

## 2023-07-20 VITALS — BP 144/88 | HR 93 | Temp 97.3°F | Ht 64.5 in | Wt 173.7 lb

## 2023-07-20 DIAGNOSIS — K5900 Constipation, unspecified: Secondary | ICD-10-CM

## 2023-07-20 DIAGNOSIS — K219 Gastro-esophageal reflux disease without esophagitis: Secondary | ICD-10-CM

## 2023-07-20 NOTE — Progress Notes (Signed)
Gastroenterology Office Note     Primary Care Physician:  Benetta Spar, MD  Primary Gastroenterologist: Dr. Jena Gauss    Chief Complaint   Chief Complaint  Patient presents with   Gastroesophageal Reflux    Gets rx for prevacid 15mg  from Dr. Felecia Shelling. Has had to start taking two a day instead on one per day. Still having indigestion and heart burn with taking twice daily   Constipation    Has been in restroom 3 times this morning before she could feel completely empty.      History of Present Illness   Tammy Kramer is a 73 y.o. female presenting today with a history of chronic GERD, constipation, hemorrhoids s/p multiple bandings, with GERD flare and constipation concerns.   Will have frequent bowel movements but not productive. Feels like not emptying well at times. Amitiza 8 mcg takes a few days to work. Takes Benefiber with probiotic in morning, which has made her stomach feel really good since starting this. No rectal bleeding. Drinks plenty of water. Previously has been on Linzess but doesn't remember the dosage.   GERD: recent bout of seasonal allergies. Taking lansoprazole 15mg  and had to take 30 mg recently due to some flares. Cut back on diet and purposeful changes. Sometimes nocturnal reflux. Will take 15 mg and take another dose if needed. Sometimes will have to take Ibuprofen due to shoulder pain. Tryin to limit this. No dysphagia.   Colonoscopy Aug 2023: one 8 mm polyp in splenic flexure, non-bleeding hemorrhoids. Hyperplastic polyp. 5 year surveillance only if health permits.    Past Medical History:  Diagnosis Date   Arthritis    Back pain    Chronic constipation    Complication of anesthesia    difficulty waking up from anesthesia   GERD (gastroesophageal reflux disease)    Hemorrhoids    Hypercholesterolemia    Hypertension    Seasonal allergies     Past Surgical History:  Procedure Laterality Date   ABDOMINAL HYSTERECTOMY     BACK SURGERY      C4-C5 fusion     CATARACT EXTRACTION W/PHACO Right 05/02/2017   Procedure: CATARACT EXTRACTION PHACO AND INTRAOCULAR LENS PLACEMENT (IOC);  Surgeon: Jethro Bolus, MD;  Location: [facility];  Service: Ophthalmology;  Laterality: Right;  CDE: 5.99   CATARACT EXTRACTION W/PHACO Left 05/16/2017   Procedure: CATARACT EXTRACTION PHACO AND INTRAOCULAR LENS PLACEMENT (IOC);  Surgeon: Jethro Bolus, MD;  Location: [facility];  Service: Ophthalmology;  Laterality: Left;  CDE: 3.80   cholecystectomy     CHOLECYSTECTOMY     COLONOSCOPY   10/16/2006   NUR: 3 mm polyp ablated via cold biopsy from the splenic flexure.External hemorrhoids, hyperplastic polyp   COLONOSCOPY  Feb 2011   Dr. Jena Gauss: internal hemorrhoids, otherwise normal.    COLONOSCOPY N/A 04/08/2014   RMR: Internal hemorrhoids; colonic diverticulosis( few  as described above) hematochezia likely secondary to hemorrhoids in the setting of constipation.   COLONOSCOPY N/A 12/08/2016   Dr. Jena Gauss: none bleeding internal hemorrhoids which were small. Next colonoscopy in five years.   COLONOSCOPY WITH PROPOFOL N/A 02/10/2022   Procedure: COLONOSCOPY WITH PROPOFOL;  Surgeon: Corbin Ade, MD;  [facility];  Service: Endoscopy;  Laterality: N/A;  8:15am   DILATION AND CURETTAGE OF UTERUS     multiple   ESOPHAGOGASTRODUODENOSCOPY  06/14/2006   NUR: Normal esophagogastroduodenoscopy   ESOPHAGOGASTRODUODENOSCOPY  Feb 2011   Dr. Jena Gauss: inflamed-appearing esophagus with overlying distal esophageal erosions  superimposed on noncritical Schatzki ring, s/p dilation and biopsy. Benign path   ESOPHAGOGASTRODUODENOSCOPY N/A 12/08/2016   Dr. Jena Gauss: LA grade A esophagitis, mild schatzki's ring at the GE junction, small hiatal hernia, esophageal dilation performed. Prevacid increased to BID.   foot surgery     X 5-bone spurs and tendon release; morton's neuroma.   HAND SURGERY Right    X 2-carpal tunnel release and ganglion cyst removed.   HEMORRHOID  BANDING  2016   Dr.Rourk   KNEE ARTHROSCOPY WITH LATERAL MENISECTOMY Left 03/27/2015   Procedure: KNEE ARTHROSCOPY WITH LATERAL MENISECTOMY;  Surgeon: Vickki Hearing, MD;  Location: [facility];  Service: Orthopedics;  Laterality: Left;   POLYPECTOMY  02/10/2022   Procedure: POLYPECTOMY;  Surgeon: Corbin Ade, MD;  [facility];  Service: Endoscopy;;   ROTATOR CUFF REPAIR Right     Current Outpatient Medications  Medication Sig Dispense Refill   acetaminophen (TYLENOL) 325 MG tablet Take 650 mg by mouth every 6 (six) hours as needed for moderate pain.     allopurinol (ZYLOPRIM) 100 MG tablet Take 100 mg by mouth daily.     atorvastatin (LIPITOR) 40 MG tablet Take 40 mg by mouth daily.      cycloSPORINE (RESTASIS) 0.05 % ophthalmic emulsion Place 1 drop into both eyes 2 (two) times daily as needed (dry eyes).     diclofenac (VOLTAREN) 75 MG EC tablet Take 1 tablet (75 mg total) by mouth at bedtime. 30 tablet 2   diclofenac Sodium (VOLTAREN) 1 % GEL Apply topically. Takes as needed     fluticasone (FLONASE) 50 MCG/ACT nasal spray Place 1 spray into both nostrils daily as needed for allergies.  3   hydrocortisone (PROCTOZONE-HC) 2.5 % rectal cream Place 1 application rectally 2 (two) times daily. 30 g 3   lansoprazole (PREVACID) 15 MG capsule Take 15 mg by mouth 2 (two) times daily before a meal.     Lidocaine HCl (ASPERCREME LIDOCAINE) 4 % CREA Apply 1 Application topically daily as needed (pain).     loratadine (CLARITIN) 10 MG tablet Take 10 mg by mouth daily.     lubiprostone (AMITIZA) 8 MCG capsule Take by mouth. Takes as needed     METAMUCIL FIBER PO Take 1 Scoop by mouth daily.     sucralfate (CARAFATE) 1 g tablet Take 1 tablet (1 g total) by mouth 4 (four) times daily -  with meals and at bedtime. 21 tablet 0   TURMERIC PO Take 1 capsule by mouth daily as needed (inflammation).     valsartan-hydrochlorothiazide (DIOVAN-HCT) 160-25 MG tablet Take 1 tablet by mouth daily.      No current facility-administered medications for this visit.    Allergies as of 07/20/2023 - Review Complete 07/20/2023  Allergen Reaction Noted   Codeine Other (See Comments)    Meperidine hcl Other (See Comments)     Family History  Problem Relation Age of Onset   Hypertension Mother    Aneurysm Mother    Diabetes Mother    Heart disease Father    Heart disease Son    Colon cancer Brother        age 32, deceased 2 weeks later    Social History   Socioeconomic History   Marital status: Divorced    Spouse name: Not on file   Number of children: Not on file   Years of education: Not on file   Highest education level: Not on file  Occupational History  Not on file  Tobacco Use   Smoking status: Never   Smokeless tobacco: Never   Tobacco comments:    Never smoked  Vaping Use   Vaping status: Never Used  Substance and Sexual Activity   Alcohol use: No   Drug use: No   Sexual activity: Yes    Birth control/protection: Surgical    Comment: hyst  Other Topics Concern   Not on file  Social History Narrative   Not on file   Social Drivers of Health   Financial Resource Strain: Not on file  Food Insecurity: Not on file  Transportation Needs: Not on file  Physical Activity: Not on file  Stress: Not on file  Social Connections: Not on file  Intimate Partner Violence: Not on file     Review of Systems   Gen: Denies any fever, chills, fatigue, weight loss, lack of appetite.  CV: Denies chest pain, heart palpitations, peripheral edema, syncope.  Resp: Denies shortness of breath at rest or with exertion. Denies wheezing or cough.  GI: Denies dysphagia or odynophagia. Denies jaundice, hematemesis, fecal incontinence. GU : Denies urinary burning, urinary frequency, urinary hesitancy MS: Denies joint pain, muscle weakness, cramps, or limitation of movement.  Derm: Denies rash, itching, dry skin Psych: Denies depression, anxiety, memory loss, and confusion Heme:  Denies bruising, bleeding, and enlarged lymph nodes.   Physical Exam   BP (!) 144/88   Pulse 93   Temp (!) 97.3 F (36.3 C) (Oral)   Ht 5' 4.5" (1.638 m)   Wt 173 lb 11.2 oz (78.8 kg)   BMI 29.36 kg/m  General:   Alert and oriented. Pleasant and cooperative. Well-nourished and well-developed.  Head:  Normocephalic and atraumatic. Eyes:  Without icterus Abdomen:  +BS, soft, non-tender and non-distended. No HSM noted. No guarding or rebound. No masses appreciated.  Rectal:  Deferred  Msk:  Symmetrical without gross deformities. Normal posture. Extremities:  Without edema. Neurologic:  Alert and  oriented x4;  grossly normal neurologically. Skin:  Intact without significant lesions or rashes. Psych:  Alert and cooperative. Normal mood and affect.   Assessment   Tammy Kramer is a 73 y.o. female presenting today with a history of chronic GERD, constipation, hemorrhoids s/p multiple bandings, with GERD flare and constipation concerns.   GERD: slight flare recently in setting of acute URI illness. She is on very low dose prevacid 15 mg daily, so she can take 30 mg daily for now and then decrease as things improve. Continue with dietary/behavior modifications. No alarm signs/symptoms.  Constipation: Amitiza not helpful. Will trial low dose Linzess 72 mcg daily. Discussed washout period.     PLAN   Return in 6 weeks Prevacid 30 mg daily for now Linzess 72 mcg samples Call with update   Gelene Mink, PhD, [facility] Gastroenterology

## 2023-07-20 NOTE — Patient Instructions (Signed)
You can take Prevacid 30 milligrams each day for now and then decrease back to the 15 mg once you start feeling better!  Let's stop Amitiza. Instead, let's start Linzess 72 mcg. Linzess works best when taken once a day every day, on an empty stomach, at least 30 minutes before your first meal of the day.  When Linzess is taken daily as directed:  *Constipation relief is typically felt in about a week *IBS-C patients may begin to experience relief from belly pain and overall abdominal symptoms (pain, discomfort, and bloating) in about 1 week,   with symptoms typically improving over 12 weeks.  Diarrhea may occur in the first 2 weeks -keep taking it.  The diarrhea should go away and you should start having normal, complete, full bowel movements. It may be helpful to start treatment when you can be near the comfort of your own bathroom, such as a weekend.    We will see you in 6 weeks!  I enjoyed seeing you again today! I value our relationship and want to provide genuine, compassionate, and quality care. You may receive a survey regarding your visit with me, and I welcome your feedback! Thanks so much for taking the time to complete this. I look forward to seeing you again.      Gelene Mink, PhD, ANP-BC [facility] Gastroenterology

## 2023-07-31 DIAGNOSIS — I10 Essential (primary) hypertension: Secondary | ICD-10-CM | POA: Diagnosis not present

## 2023-07-31 DIAGNOSIS — E785 Hyperlipidemia, unspecified: Secondary | ICD-10-CM | POA: Diagnosis not present

## 2023-08-29 ENCOUNTER — Encounter: Payer: Self-pay | Admitting: Gastroenterology

## 2023-08-29 ENCOUNTER — Ambulatory Visit: Payer: Medicare Other | Admitting: Gastroenterology

## 2023-08-29 VITALS — BP 118/76 | HR 103 | Temp 97.4°F | Ht 64.5 in | Wt 176.0 lb

## 2023-08-29 DIAGNOSIS — K219 Gastro-esophageal reflux disease without esophagitis: Secondary | ICD-10-CM | POA: Diagnosis not present

## 2023-08-29 DIAGNOSIS — E785 Hyperlipidemia, unspecified: Secondary | ICD-10-CM | POA: Diagnosis not present

## 2023-08-29 DIAGNOSIS — K5900 Constipation, unspecified: Secondary | ICD-10-CM | POA: Diagnosis not present

## 2023-08-29 DIAGNOSIS — I10 Essential (primary) hypertension: Secondary | ICD-10-CM | POA: Diagnosis not present

## 2023-08-29 MED ORDER — LANSOPRAZOLE 15 MG PO CPDR: 15.0000 mg | DELAYED_RELEASE_CAPSULE | Freq: Two times a day (BID) | ORAL | 3 refills | Status: AC

## 2023-08-29 NOTE — Patient Instructions (Signed)
 I have sent in the Prevacid prescription for you.  Continue on the fiber and stool softener if this is helpful for you! If you start having constipation again, you can try the samples of Trulance. Take one tablet daily with or without food.  We will see you back in 6 months!  Have a great summer!  I enjoyed seeing you again today! I value our relationship and want to provide genuine, compassionate, and quality care. You may receive a survey regarding your visit with me, and I welcome your feedback! Thanks so much for taking the time to complete this. I look forward to seeing you again.      Gelene Mink, PhD, ANP-BC [facility] Gastroenterology

## 2023-08-29 NOTE — Progress Notes (Signed)
 Gastroenterology Office Note     Primary Care Physician:  Benetta Spar, MD  Primary Gastroenterologist: Dr. Jena Gauss    Chief Complaint   Chief Complaint  Patient presents with   Follow-up    Follow up with GERD     History of Present Illness   Tammy Kramer is a 73 y.o. female presenting today with a history of chronic GERD, constipation, hemorrhoids s/p multiple bandings, for 3 months follow-up. When last seen, she was having a GERD flare and constipation issues.  Constipation: previously on Amitiza 8 mcg BID. Trial of Linzess 72 mcg at last visit. Linzess caused nausea. Increased diarrhea. She is doing well on a stool softener and fiber. She would like to have something on hand to trial in case constipation recurs. She believes she has taken 24 mcg Amitiza without much improvement in the past. No overt GI bleeding.   GERD: doing very well on prevacid 15 mg BID. No dysphagia or abdominal pain.  She is doing great today. Has several trips planned upcoming with significant other and close friend.     Colonoscopy Aug 2023: one 8 mm polyp in splenic flexure, non-bleeding hemorrhoids. Hyperplastic polyp. 5 year surveillance only if health permits   Past Medical History:  Diagnosis Date   Arthritis    Back pain    Chronic constipation    Complication of anesthesia    difficulty waking up from anesthesia   GERD (gastroesophageal reflux disease)    Hemorrhoids    Hypercholesterolemia    Hypertension    Seasonal allergies     Past Surgical History:  Procedure Laterality Date   ABDOMINAL HYSTERECTOMY     BACK SURGERY     C4-C5 fusion     CATARACT EXTRACTION W/PHACO Right 05/02/2017   Procedure: CATARACT EXTRACTION PHACO AND INTRAOCULAR LENS PLACEMENT (IOC);  Surgeon: Jethro Bolus, MD;  Location: [facility];  Service: Ophthalmology;  Laterality: Right;  CDE: 5.99   CATARACT EXTRACTION W/PHACO Left 05/16/2017   Procedure: CATARACT EXTRACTION PHACO AND  INTRAOCULAR LENS PLACEMENT (IOC);  Surgeon: Jethro Bolus, MD;  Location: [facility];  Service: Ophthalmology;  Laterality: Left;  CDE: 3.80   cholecystectomy     CHOLECYSTECTOMY     COLONOSCOPY   10/16/2006   NUR: 3 mm polyp ablated via cold biopsy from the splenic flexure.External hemorrhoids, hyperplastic polyp   COLONOSCOPY  Feb 2011   Dr. Jena Gauss: internal hemorrhoids, otherwise normal.    COLONOSCOPY N/A 04/08/2014   RMR: Internal hemorrhoids; colonic diverticulosis( few  as described above) hematochezia likely secondary to hemorrhoids in the setting of constipation.   COLONOSCOPY N/A 12/08/2016   Dr. Jena Gauss: none bleeding internal hemorrhoids which were small. Next colonoscopy in five years.   COLONOSCOPY WITH PROPOFOL N/A 02/10/2022   Procedure: COLONOSCOPY WITH PROPOFOL;  Surgeon: Corbin Ade, MD;  [facility]: [facility];  Service: Endoscopy;  Laterality: N/A;  8:15am   DILATION AND CURETTAGE OF UTERUS     multiple   ESOPHAGOGASTRODUODENOSCOPY  06/14/2006   NUR: Normal esophagogastroduodenoscopy   ESOPHAGOGASTRODUODENOSCOPY  Feb 2011   Dr. Jena Gauss: inflamed-appearing esophagus with overlying distal esophageal erosions superimposed on noncritical Schatzki ring, s/p dilation and biopsy. Benign path   ESOPHAGOGASTRODUODENOSCOPY N/A 12/08/2016   Dr. Jena Gauss: LA grade A esophagitis, mild schatzki's ring at the GE junction, small hiatal hernia, esophageal dilation performed. Prevacid increased to BID.   foot surgery     X 5-bone spurs and tendon release; morton's neuroma.   HAND  SURGERY Right    X 2-carpal tunnel release and ganglion cyst removed.   HEMORRHOID BANDING  2016   Dr.Rourk   KNEE ARTHROSCOPY WITH LATERAL MENISECTOMY Left 03/27/2015   Procedure: KNEE ARTHROSCOPY WITH LATERAL MENISECTOMY;  Surgeon: Vickki Hearing, MD;  Location: [facility];  Service: Orthopedics;  Laterality: Left;   POLYPECTOMY  02/10/2022   Procedure: POLYPECTOMY;  Surgeon: Corbin Ade, MD;  [facility];  Service: Endoscopy;;   ROTATOR CUFF REPAIR Right     Current Outpatient Medications  Medication Sig Dispense Refill   acetaminophen (TYLENOL) 325 MG tablet Take 650 mg by mouth every 6 (six) hours as needed for moderate pain.     allopurinol (ZYLOPRIM) 100 MG tablet Take 100 mg by mouth daily.     atorvastatin (LIPITOR) 40 MG tablet Take 40 mg by mouth daily.      cycloSPORINE (RESTASIS) 0.05 % ophthalmic emulsion Place 1 drop into both eyes 2 (two) times daily as needed (dry eyes).     diclofenac (VOLTAREN) 75 MG EC tablet Take 1 tablet (75 mg total) by mouth at bedtime. 30 tablet 2   diclofenac Sodium (VOLTAREN) 1 % GEL Apply topically. Takes as needed     fluticasone (FLONASE) 50 MCG/ACT nasal spray Place 1 spray into both nostrils daily as needed for allergies.  3   hydrocortisone (PROCTOZONE-HC) 2.5 % rectal cream Place 1 application rectally 2 (two) times daily. 30 g 3   lansoprazole (PREVACID) 15 MG capsule Take 15 mg by mouth 2 (two) times daily before a meal.     Lidocaine HCl (ASPERCREME LIDOCAINE) 4 % CREA Apply 1 Application topically daily as needed (pain).     loratadine (CLARITIN) 10 MG tablet Take 10 mg by mouth daily.     METAMUCIL FIBER PO Take 1 Scoop by mouth daily.     sucralfate (CARAFATE) 1 g tablet Take 1 tablet (1 g total) by mouth 4 (four) times daily -  with meals and at bedtime. 21 tablet 0   TURMERIC PO Take 1 capsule by mouth daily as needed (inflammation).     valsartan-hydrochlorothiazide (DIOVAN-HCT) 160-25 MG tablet Take 1 tablet by mouth daily.     lubiprostone (AMITIZA) 8 MCG capsule Take by mouth. Takes as needed (Patient not taking: Reported on 08/29/2023)     No current facility-administered medications for this visit.    Allergies as of 08/29/2023 - Review Complete 08/29/2023  Allergen Reaction Noted   Codeine Other (See Comments)    Meperidine hcl Other (See Comments)     Family History  Problem Relation Age of Onset   Hypertension  Mother    Aneurysm Mother    Diabetes Mother    Heart disease Father    Heart disease Son    Colon cancer Brother        age 54, deceased 2 weeks later    Social History   Socioeconomic History   Marital status: Divorced    Spouse name: Not on file   Number of children: Not on file   Years of education: Not on file   Highest education level: Not on file  Occupational History   Not on file  Tobacco Use   Smoking status: Never   Smokeless tobacco: Never   Tobacco comments:    Never smoked  Vaping Use   Vaping status: Never Used  Substance and Sexual Activity   Alcohol use: No   Drug use: No   Sexual activity: Yes  Birth control/protection: Surgical    Comment: hyst  Other Topics Concern   Not on file  Social History Narrative   Not on file   Social Drivers of Health   Financial Resource Strain: Not on file  Food Insecurity: Not on file  Transportation Needs: Not on file  Physical Activity: Not on file  Stress: Not on file  Social Connections: Not on file  Intimate Partner Violence: Not on file     Review of Systems   Gen: Denies any fever, chills, fatigue, weight loss, lack of appetite.  CV: Denies chest pain, heart palpitations, peripheral edema, syncope.  Resp: Denies shortness of breath at rest or with exertion. Denies wheezing or cough.  GI: Denies dysphagia or odynophagia. Denies jaundice, hematemesis, fecal incontinence. GU : Denies urinary burning, urinary frequency, urinary hesitancy MS: Denies joint pain, muscle weakness, cramps, or limitation of movement.  Derm: Denies rash, itching, dry skin Psych: Denies depression, anxiety, memory loss, and confusion Heme: Denies bruising, bleeding, and enlarged lymph nodes.   Physical Exam   BP 118/76   Pulse (!) 103   Temp (!) 97.4 F (36.3 C)   Ht 5' 4.5" (1.638 m)   Wt 176 lb (79.8 kg)   BMI 29.74 kg/m  General:   Alert and oriented. Pleasant and cooperative. Well-nourished and well-developed.   Head:  Normocephalic and atraumatic. Eyes:  Without icterus Abdomen:  +BS, soft, non-tender and non-distended. No HSM noted. No guarding or rebound. No masses appreciated.  Rectal:  Deferred  Msk:  Symmetrical without gross deformities. Normal posture. Extremities:  Without edema. Neurologic:  Alert and  oriented x4;  grossly normal neurologically. Skin:  Intact without significant lesions or rashes. Psych:  Alert and cooperative. Normal mood and affect.   Assessment   Tammy Kramer is a 73 y.o. female presenting today with a history of chronic GERD, constipation, hemorrhoids s/p multiple bandings, for 3 months follow-up.   Constipation: previously been on Amitiza 8 mcg and 24 mcg without significant improvement. Linzess 72 mcg with nausea. She is actually doing quite well with stool softener and Benefiber, but she would like to have something on hand in case needed. Will provide Trulance samples  GERD: continue with low dose Prevacid 15 mg BID. Excellent control.     PLAN    Supplemental fiber, stool softener Trial of Trulance if needed 6 month return   Gelene Mink, PhD, ANP-BC [facility] Gastroenterology

## 2023-09-29 DIAGNOSIS — E785 Hyperlipidemia, unspecified: Secondary | ICD-10-CM | POA: Diagnosis not present

## 2023-09-29 DIAGNOSIS — I10 Essential (primary) hypertension: Secondary | ICD-10-CM | POA: Diagnosis not present

## 2023-10-29 DIAGNOSIS — E785 Hyperlipidemia, unspecified: Secondary | ICD-10-CM | POA: Diagnosis not present

## 2023-10-29 DIAGNOSIS — I10 Essential (primary) hypertension: Secondary | ICD-10-CM | POA: Diagnosis not present

## 2023-11-29 DIAGNOSIS — I10 Essential (primary) hypertension: Secondary | ICD-10-CM | POA: Diagnosis not present

## 2023-11-29 DIAGNOSIS — E785 Hyperlipidemia, unspecified: Secondary | ICD-10-CM | POA: Diagnosis not present

## 2024-01-12 DIAGNOSIS — K219 Gastro-esophageal reflux disease without esophagitis: Secondary | ICD-10-CM | POA: Diagnosis not present

## 2024-01-12 DIAGNOSIS — M109 Gout, unspecified: Secondary | ICD-10-CM | POA: Diagnosis not present

## 2024-01-12 DIAGNOSIS — J309 Allergic rhinitis, unspecified: Secondary | ICD-10-CM | POA: Diagnosis not present

## 2024-01-12 DIAGNOSIS — I10 Essential (primary) hypertension: Secondary | ICD-10-CM | POA: Diagnosis not present

## 2024-01-12 DIAGNOSIS — E785 Hyperlipidemia, unspecified: Secondary | ICD-10-CM | POA: Diagnosis not present

## 2024-01-19 DIAGNOSIS — I10 Essential (primary) hypertension: Secondary | ICD-10-CM | POA: Diagnosis not present

## 2024-02-02 ENCOUNTER — Encounter: Payer: Self-pay | Admitting: Gastroenterology

## 2024-02-19 DIAGNOSIS — E785 Hyperlipidemia, unspecified: Secondary | ICD-10-CM | POA: Diagnosis not present

## 2024-02-19 DIAGNOSIS — I10 Essential (primary) hypertension: Secondary | ICD-10-CM | POA: Diagnosis not present

## 2024-03-20 DIAGNOSIS — E785 Hyperlipidemia, unspecified: Secondary | ICD-10-CM | POA: Diagnosis not present

## 2024-03-20 DIAGNOSIS — I10 Essential (primary) hypertension: Secondary | ICD-10-CM | POA: Diagnosis not present

## 2024-04-01 ENCOUNTER — Other Ambulatory Visit (HOSPITAL_COMMUNITY): Payer: Self-pay | Admitting: Internal Medicine

## 2024-04-01 DIAGNOSIS — Z1231 Encounter for screening mammogram for malignant neoplasm of breast: Secondary | ICD-10-CM

## 2024-04-03 ENCOUNTER — Ambulatory Visit (HOSPITAL_COMMUNITY)
Admission: RE | Admit: 2024-04-03 | Discharge: 2024-04-03 | Disposition: A | Discharge status: Home / Self Care | Source: Ambulatory Visit | Attending: Internal Medicine | Admitting: Internal Medicine

## 2024-04-03 ENCOUNTER — Encounter (HOSPITAL_COMMUNITY): Payer: Self-pay

## 2024-04-03 DIAGNOSIS — Z1231 Encounter for screening mammogram for malignant neoplasm of breast: Secondary | ICD-10-CM | POA: Insufficient documentation

## 2024-06-18 ENCOUNTER — Ambulatory Visit: Admitting: Gastroenterology

## 2024-06-18 VITALS — BP 137/68 | HR 99 | Temp 98.6°F | Ht 64.5 in | Wt 174.0 lb

## 2024-06-18 DIAGNOSIS — K219 Gastro-esophageal reflux disease without esophagitis: Secondary | ICD-10-CM | POA: Diagnosis not present

## 2024-06-18 DIAGNOSIS — K5900 Constipation, unspecified: Secondary | ICD-10-CM

## 2024-06-18 NOTE — Patient Instructions (Signed)
 I am so glad you are doing well!  Please call with any concerns.  I will see you in 6 months!  Have a great New Year!  I enjoyed seeing you again today! I value our relationship and want to provide genuine, compassionate, and quality care. You may receive a survey regarding your visit with me, and I welcome your feedback! Thanks so much for taking the time to complete this. I look forward to seeing you again.      Therisa MICAEL Stager, PhD, ANP-BC [facility] Gastroenter[facility]

## 2024-06-18 NOTE — Progress Notes (Unsigned)
 "   Gastroenterology Office Note     Primary Care Physician:  Carlette Benita Area, MD  Primary Gastroenterologist:   Chief Complaint   Chief Complaint  Patient presents with   Follow-up    Follow up with on GERD and constipation. Nothing is bothering here today. Pt states she actually is better     History of Present Illness   Tammy Kramer is a 72 y.o. female presenting today with a history of chronic GERD, constipation, hemorrhoids s/p multiple bandings, last seen in March 2025.   Constipation: Previously on Amitiza  8 mcg BID Linzess  72 mcg Last on stool softener with fiber and doing well  Still taking stool softener but every other day. Takes miralax daily. If takes stool softener daily, she will have looser stool.     GERD: prevacid  15 mg BID   Colonoscopy Aug 2023: one 8 mm polyp in splenic flexure, non-bleeding hemorrhoids. Hyperplastic polyp. 5 year surveillance only if health permits   Past Medical History:  Diagnosis Date   Arthritis    Back pain    Chronic constipation    Complication of anesthesia    difficulty waking up from anesthesia   GERD (gastroesophageal reflux disease)    Hemorrhoids    Hypercholesterolemia    Hypertension    Seasonal allergies     Past Surgical History:  Procedure Laterality Date   ABDOMINAL HYSTERECTOMY     BACK SURGERY     C4-C5 fusion     CATARACT EXTRACTION W/PHACO Right 05/02/2017   Procedure: CATARACT EXTRACTION PHACO AND INTRAOCULAR LENS PLACEMENT (IOC);  Surgeon: Roz Anes, MD;  Location: [facility];  Service: Ophthalmology;  Laterality: Right;  CDE: 5.99   CATARACT EXTRACTION W/PHACO Left 05/16/2017   Procedure: CATARACT EXTRACTION PHACO AND INTRAOCULAR LENS PLACEMENT (IOC);  Surgeon: Roz Anes, MD;  Location: [facility];  Service: Ophthalmology;  Laterality: Left;  CDE: 3.80   cholecystectomy     CHOLECYSTECTOMY     COLONOSCOPY   10/16/2006   NUR: 3 mm polyp ablated via cold biopsy from the splenic  flexure.External hemorrhoids, hyperplastic polyp   COLONOSCOPY  Feb 2011   Dr. Shaaron: internal hemorrhoids, otherwise normal.    COLONOSCOPY N/A 04/08/2014   RMR: Internal hemorrhoids; colonic diverticulosis( few  as described above) hematochezia likely secondary to hemorrhoids in the setting of constipation.   COLONOSCOPY N/A 12/08/2016   Dr. Shaaron: none bleeding internal hemorrhoids which were small. Next colonoscopy in five years.   COLONOSCOPY WITH PROPOFOL  N/A 02/10/2022   Procedure: COLONOSCOPY WITH PROPOFOL ;  Surgeon: Shaaron Lamar HERO, MD;  [facility];  Service: Endoscopy;  Laterality: N/A;  8:15am   DILATION AND CURETTAGE OF UTERUS     multiple   ESOPHAGOGASTRODUODENOSCOPY  06/14/2006   NUR: Normal esophagogastroduodenoscopy   ESOPHAGOGASTRODUODENOSCOPY  Feb 2011   Dr. Shaaron: inflamed-appearing esophagus with overlying distal esophageal erosions superimposed on noncritical Schatzki ring, s/p dilation and biopsy. Benign path   ESOPHAGOGASTRODUODENOSCOPY N/A 12/08/2016   Dr. Shaaron: LA grade A esophagitis, mild schatzki's ring at the GE junction, small hiatal hernia, esophageal dilation performed. Prevacid  increased to BID.   foot surgery     X 5-bone spurs and tendon release; morton's neuroma.   HAND SURGERY Right    X 2-carpal tunnel release and ganglion cyst removed.   HEMORRHOID BANDING  2016   Dr.Rourk   KNEE ARTHROSCOPY WITH LATERAL MENISECTOMY Left 03/27/2015   Procedure: KNEE ARTHROSCOPY WITH LATERAL MENISECTOMY;  Surgeon: Taft FORBES Minerva, MD;  Location: [facility];  Service: Orthopedics;  Laterality: Left;   POLYPECTOMY  02/10/2022   Procedure: POLYPECTOMY;  Surgeon: Shaaron Lamar HERO, MD;  [facility];  Service: Endoscopy;;   ROTATOR CUFF REPAIR Right     Current Outpatient Medications  Medication Sig Dispense Refill   acetaminophen  (TYLENOL ) 325 MG tablet Take 650 mg by mouth every 6 (six) hours as needed for moderate pain.     allopurinol (ZYLOPRIM)  100 MG tablet Take 100 mg by mouth daily.     amLODipine (NORVASC) 2.5 MG tablet Take 2.5 mg by mouth daily.     atorvastatin (LIPITOR) 40 MG tablet Take 40 mg by mouth daily.      cycloSPORINE (RESTASIS) 0.05 % ophthalmic emulsion Place 1 drop into both eyes 2 (two) times daily as needed (dry eyes).     diclofenac  (VOLTAREN ) 75 MG EC tablet Take 1 tablet (75 mg total) by mouth at bedtime. 30 tablet 2   diclofenac  Sodium (VOLTAREN ) 1 % GEL Apply topically. Takes as needed     fluticasone (FLONASE) 50 MCG/ACT nasal spray Place 1 spray into both nostrils daily as needed for allergies.  3   hydrocortisone  (PROCTOZONE -HC) 2.5 % rectal cream Place 1 application rectally 2 (two) times daily. 30 g 3   lansoprazole  (PREVACID ) 15 MG capsule Take 1 capsule (15 mg total) by mouth 2 (two) times daily before a meal. 180 capsule 3   Lidocaine  HCl (ASPERCREME LIDOCAINE ) 4 % CREA Apply 1 Application topically daily as needed (pain).     loratadine (CLARITIN) 10 MG tablet Take 10 mg by mouth daily.     METAMUCIL FIBER PO Take 1 Scoop by mouth daily.     sucralfate  (CARAFATE ) 1 g tablet Take 1 tablet (1 g total) by mouth 4 (four) times daily -  with meals and at bedtime. 21 tablet 0   TURMERIC PO Take 1 capsule by mouth daily as needed (inflammation).     valsartan-hydrochlorothiazide (DIOVAN-HCT) 160-25 MG tablet Take 1 tablet by mouth daily.     No current facility-administered medications for this visit.    Allergies as of 06/18/2024 - Review Complete 06/18/2024  Allergen Reaction Noted   Codeine Other (See Comments)    Meperidine  hcl Other (See Comments)     Family History  Problem Relation Age of Onset   Hypertension Mother    Aneurysm Mother    Diabetes Mother    Heart disease Father    Heart disease Son    Colon cancer Brother        age 62, deceased 2 weeks later    Social History   Socioeconomic History   Marital status: Divorced    Spouse name: Not on file   Number of children: Not  on file   Years of education: Not on file   Highest education level: Not on file  Occupational History   Not on file  Tobacco Use   Smoking status: Never   Smokeless tobacco: Never   Tobacco comments:    Never smoked  Vaping Use   Vaping status: Never Used  Substance and Sexual Activity   Alcohol  use: No   Drug use: No   Sexual activity: Yes    Birth control/protection: Surgical    Comment: hyst  Other Topics Concern   Not on file  Social History Narrative   Not on file   Social Drivers of Health   Tobacco Use: Low Risk (08/29/2023)   Patient History  Smoking Tobacco Use: Never    Smokeless Tobacco Use: Never    Passive Exposure: Not on file  Financial Resource Strain: Not on file  Food Insecurity: Not on file  Transportation Needs: Not on file  Physical Activity: Not on file  Stress: Not on file  Social Connections: Not on file  Intimate Partner Violence: Not on file  Depression (EYV7-0): Not on file  Alcohol  Screen: Not on file  Housing: Not on file  Utilities: Not on file  Health Literacy: Not on file     Review of Systems   Gen: Denies any fever, chills, fatigue, weight loss, lack of appetite.  CV: Denies chest pain, heart palpitations, peripheral edema, syncope.  Resp: Denies shortness of breath at rest or with exertion. Denies wheezing or cough.  GI: Denies dysphagia or odynophagia. Denies jaundice, hematemesis, fecal incontinence. GU : Denies urinary burning, urinary frequency, urinary hesitancy MS: Denies joint pain, muscle weakness, cramps, or limitation of movement.  Derm: Denies rash, itching, dry skin Psych: Denies depression, anxiety, memory loss, and confusion Heme: Denies bruising, bleeding, and enlarged lymph nodes.   Physical Exam   BP 137/68   Pulse 99   Temp 98.6 F (37 C)   Ht 5' 4.5 (1.638 m)   Wt 174 lb (78.9 kg)   BMI 29.41 kg/m  General:   Alert and oriented. Pleasant and cooperative. Well-nourished and well-developed.   Head:  Normocephalic and atraumatic. Eyes:  Without icterus Abdomen:  +BS, soft, non-tender and non-distended. No HSM noted. No guarding or rebound. No masses appreciated.  Rectal:  Deferred  Msk:  Symmetrical without gross deformities. Normal posture. Extremities:  Without edema. Neurologic:  Alert and  oriented x4;  grossly normal neurologically. Skin:  Intact without significant lesions or rashes. Psych:  Alert and cooperative. Normal mood and affect.   Assessment   Tammy Kramer is a 73 y.o. female presenting today with a history of    PLAN   *****    Therisa MICAEL Stager, PhD, ANP-BC [facility] Gastroenterology    "
